# Patient Record
Sex: Male | Born: 1960 | Race: Black or African American | Hispanic: No | Marital: Single | Smoking: Current every day smoker
Health system: Southern US, Community
[De-identification: ages and names within clinical notes are randomized; demographics above are authoritative.]

## PROBLEM LIST (undated history)

## (undated) DIAGNOSIS — I10 Essential (primary) hypertension: Secondary | ICD-10-CM

---

## 2015-05-04 ENCOUNTER — Other Ambulatory Visit: Payer: Self-pay

## 2015-05-04 ENCOUNTER — Encounter (HOSPITAL_COMMUNITY): Payer: Self-pay

## 2015-05-04 ENCOUNTER — Emergency Department (HOSPITAL_COMMUNITY): Payer: Self-pay

## 2015-05-04 ENCOUNTER — Emergency Department (HOSPITAL_COMMUNITY)
Admission: EM | Admit: 2015-05-04 | Discharge: 2015-05-04 | Disposition: A | Discharge status: Home / Self Care | Payer: Self-pay | Attending: Emergency Medicine | Admitting: Emergency Medicine

## 2015-05-04 DIAGNOSIS — R630 Anorexia: Secondary | ICD-10-CM | POA: Insufficient documentation

## 2015-05-04 DIAGNOSIS — E871 Hypo-osmolality and hyponatremia: Secondary | ICD-10-CM | POA: Insufficient documentation

## 2015-05-04 DIAGNOSIS — F17200 Nicotine dependence, unspecified, uncomplicated: Secondary | ICD-10-CM | POA: Insufficient documentation

## 2015-05-04 DIAGNOSIS — R55 Syncope and collapse: Secondary | ICD-10-CM | POA: Insufficient documentation

## 2015-05-04 LAB — BASIC METABOLIC PANEL
Anion gap: 13 (ref 5–15)
BUN: 8 mg/dL (ref 6–20)
CO2: 22 mmol/L (ref 22–32)
Calcium: 9.3 mg/dL (ref 8.9–10.3)
Chloride: 90 mmol/L — ABNORMAL LOW (ref 101–111)
Creatinine, Ser: 1.06 mg/dL (ref 0.61–1.24)
GFR calc Af Amer: >60 mL/min (ref >60)
GLUCOSE: 133 mg/dL — AB (ref 65–99)
POTASSIUM: 3.4 mmol/L — AB (ref 3.5–5.1)
SODIUM: 125 mmol/L — AB (ref 135–145)

## 2015-05-04 LAB — DIFFERENTIAL
Basophils Absolute: 0 10*3/uL (ref 0.0–0.1)
Basophils Relative: 0 %
EOS ABS: 0 10*3/uL (ref 0.0–0.7)
EOS PCT: 1 %
LYMPHS PCT: 44 %
Lymphs Abs: 1.8 10*3/uL (ref 0.7–4.0)
MONO ABS: 0.7 10*3/uL (ref 0.1–1.0)
Monocytes Relative: 17 %
Neutro Abs: 1.6 10*3/uL — ABNORMAL LOW (ref 1.7–7.7)
Neutrophils Relative %: 39 %

## 2015-05-04 LAB — CBC
HCT: 39.1 % (ref 39.0–52.0)
Hemoglobin: 13.9 g/dL (ref 13.0–17.0)
MCH: 32.8 pg (ref 26.0–34.0)
MCHC: 35.5 g/dL (ref 30.0–36.0)
MCV: 92.2 fL (ref 78.0–100.0)
PLATELETS: 63 10*3/uL — AB (ref 150–400)
RBC: 4.24 MIL/uL (ref 4.22–5.81)
RDW: 13.7 % (ref 11.5–15.5)
WBC: 4.2 10*3/uL (ref 4.0–10.5)

## 2015-05-04 LAB — TROPONIN I: Troponin I: <0.03 ng/mL (ref <0.031)

## 2015-05-04 MED ORDER — ASPIRIN 81 MG PO CHEW: 324.0000 mg | CHEWABLE_TABLET | Freq: Once | ORAL | Status: DC

## 2015-05-04 MED ORDER — HEPARIN SODIUM (PORCINE) 5000 UNIT/ML IJ SOLN: 60.0000 [IU]/kg | Freq: Once | INTRAMUSCULAR | Status: DC

## 2015-05-04 MED ORDER — SODIUM CHLORIDE 0.9 % IV BOLUS (SEPSIS)
1000.0000 mL | Freq: Once | INTRAVENOUS | Status: AC
  Administered 2015-05-04: 1000 mL via INTRAVENOUS

## 2015-05-04 NOTE — ED Notes (Signed)
stemi cancelled.  

## 2015-05-04 NOTE — ED Notes (Signed)
See stemi narrator

## 2015-05-04 NOTE — ED Provider Notes (Signed)
CSN: [id number]     Arrival date & time 05/04/15  0039 History   By signing my name below, I, Charles Walters, attest that this documentation has been prepared under the direction and in the presence of Charles BilisKevin Shaquan Puerta, MD.  Electronically Signed: Arlan OrganAshley Walters, ED Scribe. 05/04/2015. 12:51 AM.   Chief Complaint  Patient presents with  . Code STEMI   HPI  HPI Comments: Charles Walters brought in by EMS is a 54 y.o. male without any pertinent past medical history who presents to the Emergency Department here for possible code NSTEMI this evening. Pt states he was in the bathroom attempting to bend down to the use bathroom. States he then fell to the ground and had a syncopal episode lasting 3-5 minutes with incontinence just prior to arrival. Pt states he felt fine prior to and after the episode. Charles Walters admits he hasnt eaten much in the last 24 hours as he has not had an appetite. ETOH use reported in last few days. Pt was given a dose of ASA en route to department but no Nitro given. He denies any fever, chills, nausea, vomiting, diarrhea, chest pain, shortness of breath, or neck pain at this time. No prior history of stroke or MI. He denies any personal or family history of diabetes, HTN, or hyperlipidemia.  He does report daily are all intact.  PCP: No PCP Per Patient    History reviewed. No pertinent past medical history. History reviewed. No pertinent past surgical history. History reviewed. No pertinent family history. Social History  Substance Use Topics  . Smoking status: Current Every Day Smoker  . Smokeless tobacco: None  . Alcohol Use: Beer Daily    Review of Systems  A complete 10 system review of systems was obtained and all systems are negative except as noted in the HPI and PMH.    Allergies  Review of patient's allergies indicates no known allergies.  Home Medications   Prior to Admission medications   Not on File   Triage Vitals: BP 149/105 mmHg  Pulse 86  Temp(Src)  98.6 F (37 C) (Oral)  Resp 16  Ht 6\' 1"  (1.854 m)  Wt 185 lb (83.915 kg)  BMI 24.41 kg/m2  SpO2 99%   Physical Exam  Constitutional: He is oriented to person, place, and time. He appears well-developed and well-nourished.  HENT:  Head: Normocephalic and atraumatic.  Eyes: EOM are normal.  Neck: Normal range of motion.  Cardiovascular: Normal rate, regular rhythm, normal heart sounds and intact distal pulses.   Pulmonary/Chest: Effort normal and breath sounds normal. No respiratory distress.  Abdominal: Soft. He exhibits no distension. There is no tenderness.  Musculoskeletal: Normal range of motion.  Neurological: He is alert and oriented to person, place, and time.  Skin: Skin is warm and dry.  Psychiatric: He has a normal mood and affect. Judgment normal.  Nursing note and vitals reviewed.   ED Course  Procedures (including critical care time)  DIAGNOSTIC STUDIES: Oxygen Saturation is 99% on RA, Normal by my interpretation.    COORDINATION OF CARE: 12:42 AM-  Will order CXR, troponin I, and CBC. Discussed treatment plan with pt at bedside and pt agreed to plan.     Labs Review Labs Reviewed  CBC - Abnormal; Notable for the following:    Platelets 63 (*)    All other components within normal limits  DIFFERENTIAL - Abnormal; Notable for the following:    Neutro Abs 1.6 (*)    All  other components within normal limits  BASIC METABOLIC PANEL - Abnormal; Notable for the following:    Sodium 125 (*)    Potassium 3.4 (*)    Chloride 90 (*)    Glucose, Bld 133 (*)    All other components within normal limits  TROPONIN I    Imaging Review Dg Chest Portable 1 View  05/04/2015  CLINICAL DATA:  Syncopal episode for 3-5 minutes. Incontinence. Alcohol on board. EXAM: PORTABLE CHEST 1 VIEW COMPARISON:  None. FINDINGS: The heart size and mediastinal contours are within normal limits. Both lungs are clear. The visualized skeletal structures are unremarkable. IMPRESSION: No  active disease. Electronically Signed   By: Burman Nieves M.D.   On: 05/04/2015 01:07   I have personally reviewed and evaluated these images and lab results as part of my medical decision-making.   EKG Interpretation   Date/Time:  Sunday May 04 2015 00:45:24 EST Ventricular Rate:  87 PR Interval:  140 QRS Duration: 80 QT Interval:  384 QTC Calculation: 462 R Axis:   26 Text Interpretation:  Normal sinus rhythm Normal ECG No old tracing to  compare Confirmed by Sharnelle Cappelli  MD, Charles Walters ([id number]) on 05/04/2015 12:55:12 AM      MDM   Final diagnoses:  Syncope, unspecified syncope type  Hyponatremia    Hyponatremia and thrombocytopenia.  The patient does admit to daily beer intake.  I suspect this is chronic volume depletion.  His episode tonight was more than likely orthostasis.  He feels much better after IV fluids.  Patient was brought to the emergency department as a code STEMI but had absolutely no anginal complaints.  His EKG is without ischemic changes.  Doubt ACS.  The patient does not have a primary care physician.  Recommend they follow up with 1.  Discharge home in good condition.  He understands to return to the ER for new or worsening symptoms  I personally performed the services described in this documentation, which was scribed in my presence. The recorded information has been reviewed and is accurate.       Charles Bilis, MD 05/04/15 [phone]

## 2015-05-04 NOTE — ED Notes (Signed)
Patient here by ems, had syncopal episode for 3-5 minutes and had incontinence, pt has etoh on board, reports no dizziness, lightheadedness or pain. Pt irritated with ems. Given asa pressure with ems 90 systolic

## 2015-05-04 NOTE — Discharge Instructions (Signed)
Hyponatremia °Hyponatremia is when the amount of salt (sodium) in your blood is too low. When sodium levels are low, your cells absorb extra water and they swell. The swelling happens throughout the body, but it mostly affects the brain. °CAUSES °This condition may be caused by: °· Heart, kidney, or liver problems. °· Thyroid problems. °· Adrenal gland problems. °· Metabolic conditions, such as syndrome of inappropriate antidiuretic hormone (SIADH). °· Severe vomiting and diarrhea. °· Certain medicines or illegal drugs. °· Dehydration. °· Drinking too much water. °· Eating a diet that is low in sodium. °· Large burns on your body. °· Sweating. °RISK FACTORS °This condition is more likely to develop in people who: °· Have long-term (chronic) kidney disease. °· Have heart failure. °· Have a medical condition that causes frequent or excessive diarrhea. °· Have metabolic conditions, such as Addison disease or SIADH. °· Take certain medicines that affect the sodium and fluid balance in the blood. Some of these medicine types include: °¨ Diuretics. °¨ NSAIDs. °¨ Some opioid pain medicines. °¨ Some antidepressants. °¨ Some seizure prevention medicines. °SYMPTOMS  °Symptoms of this condition include: °· Nausea and vomiting. °· Confusion. °· Lethargy. °· Agitation. °· Headache. °· Seizures. °· Unconsciousness. °· Appetite loss. °· Muscle weakness and cramping. °· Feeling weak or light-headed. °· Having a rapid heart rate. °· Fainting, in severe cases. °DIAGNOSIS °This condition is diagnosed with a medical history and physical exam. You will also have other tests, including: °· Blood tests. °· Urine tests. °TREATMENT °Treatment for this condition depends on the cause. Treatment may include: °· Fluids given through an IV tube that is inserted into one of your veins. °· Medicines to correct the sodium imbalance. If medicines are causing the condition, the medicines will need to be adjusted. °· Limiting water or fluid intake to  get the correct sodium balance. °HOME CARE INSTRUCTIONS °· Take medicines only as directed by your health care provider. Many medicines can make this condition worse. Talk with your health care provider about any medicines that you are currently taking. °· Carefully follow a recommended diet as directed by your health care provider. °· Carefully follow instructions from your health care provider about fluid restrictions. °· Keep all follow-up visits as directed by your health care provider. This is important. °· Do not drink alcohol. °SEEK MEDICAL CARE IF: °· You develop worsening nausea, fatigue, headache, confusion, or weakness. °· Your symptoms go away and then return. °· You have problems following the recommended diet. °SEEK IMMEDIATE MEDICAL CARE IF: °· You have a seizure. °· You faint. °· You have ongoing diarrhea or vomiting. °  °This information is not intended to replace advice given to you by your health care provider. Make sure you discuss any questions you have with your health care provider. °  °Document Released: 05/14/2002 Document Revised: 10/08/2014 Document Reviewed: 06/13/2014 °Elsevier Interactive Patient Education ©2016 [employer] ° ° ° °

## 2015-05-23 ENCOUNTER — Ambulatory Visit: Payer: Self-pay

## 2016-11-24 ENCOUNTER — Ambulatory Visit (HOSPITAL_COMMUNITY)
Admission: EM | Admit: 2016-11-24 | Discharge: 2016-11-24 | Disposition: A | Discharge status: Home / Self Care | Payer: Self-pay | Attending: Family Medicine | Admitting: Family Medicine

## 2016-11-24 ENCOUNTER — Encounter (HOSPITAL_COMMUNITY): Payer: Self-pay | Admitting: Emergency Medicine

## 2016-11-24 DIAGNOSIS — T783XXA Angioneurotic edema, initial encounter: Secondary | ICD-10-CM

## 2016-11-24 DIAGNOSIS — I10 Essential (primary) hypertension: Secondary | ICD-10-CM

## 2016-11-24 DIAGNOSIS — Z888 Allergy status to other drugs, medicaments and biological substances status: Secondary | ICD-10-CM

## 2016-11-24 HISTORY — DX: Essential (primary) hypertension: I10

## 2016-11-24 MED ORDER — AMLODIPINE BESYLATE 5 MG PO TABS: 5.0000 mg | ORAL_TABLET | Freq: Every day | ORAL | 0 refills | Status: DC

## 2016-11-24 MED ORDER — METHYLPREDNISOLONE 4 MG PO TBPK: ORAL_TABLET | ORAL | 0 refills | Status: DC

## 2016-11-24 MED ORDER — METHYLPREDNISOLONE SODIUM SUCC 125 MG IJ SOLR
INTRAMUSCULAR | Status: AC
  Filled 2016-11-24: qty 2

## 2016-11-24 MED ORDER — METHYLPREDNISOLONE SODIUM SUCC 125 MG IJ SOLR
125.0000 mg | Freq: Once | INTRAMUSCULAR | Status: AC
  Administered 2016-11-24: 125 mg via INTRAMUSCULAR

## 2016-11-24 NOTE — Discharge Instructions (Signed)
Follow up with PCP.  Stop taking bp medicine from home that starts with L it is probably ACE inhibitor and you are allergic to this.  Start new bp medicine and see PCP in next week.

## 2016-11-24 NOTE — ED Provider Notes (Signed)
CSN: [id number]     Arrival date & time 11/24/16  1108 History   First MD Initiated Contact with Patient 11/24/16 1154     Chief Complaint  Patient presents with  . Facial Swelling   (Consider location/radiation/quality/duration/timing/severity/associated sxs/prior Treatment) Patient c/o facial and lower lip swelling today.  He does take a bp med from home that starts with an L and ends with an L and he states he takes 20mg  dosage.  He has been on this medicine for 3 years.   The history is provided by the patient.  Facial Injury  Location:  Face (lower lip) Pain details:    Severity:  Moderate   Duration:  1 day   Timing:  Constant   Progression:  Unable to specify Foreign body present:  Unable to specify Relieved by:  Nothing Worsened by:  Nothing Ineffective treatments:  None tried   Past Medical History:  Diagnosis Date  . Hypertension    No past surgical history on file. No family history on file. Social History  Substance Use Topics  . Smoking status: Current Every Day Smoker  . Smokeless tobacco: Not on file  . Alcohol use Yes    Review of Systems  Constitutional: Negative.   HENT: Negative.   Eyes: Negative.   Respiratory: Negative.   Cardiovascular: Negative.   Gastrointestinal: Negative.   Endocrine: Negative.   Genitourinary: Negative.   Musculoskeletal: Positive for arthralgias.  Allergic/Immunologic: Negative.   Neurological: Negative.   Hematological: Negative.   Psychiatric/Behavioral: Negative.     Allergies  Patient has no known allergies.  Home Medications   Prior to Admission medications   Medication Sig Start Date End Date Taking? Authorizing Provider  OVER THE COUNTER MEDICATION Patient takes one type of blood pressure medicine and started this med 4 months ago.  ?l..p"   Yes [provider]  amLODipine (NORVASC) 5 MG tablet Take 1 tablet (5 mg total) by mouth daily. 11/24/16   Deatra Canterxford, Max Romano J, FNP  methylPREDNISolone  (MEDROL DOSEPAK) 4 MG TBPK tablet 6-5-4-3-2-1 po qd 11/24/16   Deatra Canterxford, Nolan Lasser J, FNP   Meds Ordered and Administered this Visit   Medications  methylPREDNISolone sodium succinate (SOLU-MEDROL) 125 mg/2 mL injection 125 mg (125 mg Intramuscular Given 11/24/16 1206)    BP 124/87 (BP Location: Left Arm)   Pulse 96   Temp 98.4 F (36.9 C) (Oral)   Resp 20   SpO2 100%  No data found.   Physical Exam  Constitutional: He appears well-developed and well-nourished.  HENT:  Head: Normocephalic and atraumatic.  Left lower lip with swelling.  Eyes: Conjunctivae and EOM are normal. Pupils are equal, round, and reactive to light.  Neck: Normal range of motion. Neck supple.  Cardiovascular: Normal rate, regular rhythm and normal heart sounds.   Pulmonary/Chest: Effort normal and breath sounds normal.  Abdominal: Soft. Bowel sounds are normal.  Nursing note and vitals reviewed.   Urgent Care Course     Procedures (including critical care time)  Labs Review Labs Reviewed - No data to display  Imaging Review No results found.   Visual Acuity Review  Right Eye Distance:   Left Eye Distance:   Bilateral Distance:    Right Eye Near:   Left Eye Near:    Bilateral Near:         MDM   1. Angioedema, initial encounter   2. Essential hypertension   3. Allergy to ACE inhibitors    Stop medicine for bp from  home and start amlodipine 5mg  one po qd #14  Follow up with pcp.      Deatra Canter, FNP 11/24/16 1245

## 2016-11-24 NOTE — ED Triage Notes (Signed)
Woke today with left facial swelling and left side of lip swollen.  Denies breathing trouble Patient did have dental work 3 weeks ago-front tooth was pulled at that time

## 2017-06-27 ENCOUNTER — Ambulatory Visit (INDEPENDENT_AMBULATORY_CARE_PROVIDER_SITE_OTHER): Payer: Self-pay

## 2017-06-27 ENCOUNTER — Encounter (HOSPITAL_COMMUNITY): Payer: Self-pay | Admitting: Emergency Medicine

## 2017-06-27 ENCOUNTER — Ambulatory Visit (HOSPITAL_COMMUNITY)
Admission: EM | Admit: 2017-06-27 | Discharge: 2017-06-27 | Disposition: A | Discharge status: Home / Self Care | Payer: Self-pay | Attending: Family Medicine | Admitting: Family Medicine

## 2017-06-27 ENCOUNTER — Other Ambulatory Visit: Payer: Self-pay

## 2017-06-27 DIAGNOSIS — M25552 Pain in left hip: Secondary | ICD-10-CM

## 2017-06-27 MED ORDER — DICLOFENAC SODIUM 75 MG PO TBEC: 75.0000 mg | DELAYED_RELEASE_TABLET | Freq: Two times a day (BID) | ORAL | 0 refills | Status: AC

## 2017-06-27 NOTE — ED Triage Notes (Signed)
Pt c/o L upper leg pain x2 weeks. Pt has been taking tylenol without relief. Denies injury.

## 2017-06-27 NOTE — Discharge Instructions (Signed)
Xray looked good.   Please continue to treat this with antiinflammatories. I have sent in diclofenac to take twice daily for pain. May aslo supplement with tylenol.  Continue to rest and avoid aggravating factors.

## 2017-06-28 NOTE — ED Provider Notes (Signed)
[facility]    CSN: [id number] Arrival date & time: 06/27/17  1423     History   Chief Complaint Chief Complaint  Patient presents with  . Leg Pain    HPI Charles Walters is a 57 y.o. male presenting with left hip and knee pain x 1 week. Also endorsing some numbness. Denies injury, does do a lot of lifting at work. Worsens with walking. Pain improves with rest, has to rest frequently at work. Taking Tylenol extra strength for pain, with minimal relief. Denies weakness. Denies steroid use/prednisone use.   HPI  Past Medical History:  Diagnosis Date  . Hypertension     There are no active problems to display for this patient.   History reviewed. No pertinent surgical history.     Home Medications    Prior to Admission medications   Medication Sig Start Date End Date Taking? Authorizing Provider  amLODipine (NORVASC) 5 MG tablet Take 1 tablet (5 mg total) by mouth daily. 11/24/16   Charles Walters, William J, FNP  diclofenac (VOLTAREN) 75 MG EC tablet Take 1 tablet (75 mg total) by mouth 2 (two) times daily for 10 days. 06/27/17 07/07/17  Wieters, Hallie C, PA-C  methylPREDNISolone (MEDROL DOSEPAK) 4 MG TBPK tablet 6-5-4-3-2-1 po qd 11/24/16   Charles Walters, William J, FNP  OVER THE COUNTER MEDICATION Patient takes one type of blood pressure medicine and started this med 4 months ago.  ?l..p"    [provider]    Family History No family history on file.  Social History Social History   Tobacco Use  . Smoking status: Current Every Day Smoker  Substance Use Topics  . Alcohol use: Yes  . Drug use: No     Allergies   Patient has no known allergies.   Review of Systems Review of Systems  Constitutional: Negative for fatigue and fever.  Gastrointestinal: Negative for nausea and vomiting.  Musculoskeletal: Positive for arthralgias and myalgias. Negative for gait problem.  Neurological: Positive for numbness. Negative for dizziness, weakness, light-headedness and  headaches.     Physical Exam Triage Vital Signs ED Triage Vitals  Enc Vitals Group     BP 06/27/17 1538 (!) 176/103     Pulse Rate 06/27/17 1538 96     Resp 06/27/17 1538 18     Temp 06/27/17 1538 98.7 F (37.1 C)     Temp Source 06/27/17 1538 Oral     SpO2 06/27/17 1538 100 %     Weight --      Height --      Head Circumference --      Peak Flow --      Pain Score 06/27/17 1540 10     Pain Loc --      Pain Edu? --      Excl. in GC? --    No data found.  Updated Vital Signs BP (!) 176/103   Pulse 96   Temp 98.7 F (37.1 C) (Oral)   Resp 18   SpO2 100%   Visual Acuity Right Eye Distance:   Left Eye Distance:   Bilateral Distance:    Right Eye Near:   Left Eye Near:    Bilateral Near:     Physical Exam  Constitutional: He appears well-developed and well-nourished. No distress.  HENT:  Head: Normocephalic and atraumatic.  Cardiovascular: Normal rate.  Pulmonary/Chest: Effort normal. No respiratory distress.  Musculoskeletal: He exhibits tenderness. He exhibits no deformity.  Left hip: mild tenderness to palpation  of lateral hip down into midline thigh, diffuse tenderness, states numbness in some areas. Strength 5/5 compared to right.   Full ROM of left knee     UC Treatments / Results  Labs (all labs ordered are listed, but only abnormal results are displayed) Labs Reviewed - No data to display  EKG  EKG Interpretation None       Radiology Dg Hip Unilat With Pelvis 2-3 Views Right  Result Date: 06/27/2017 CLINICAL DATA:  Left hip pain for 2 weeks.  No known injury. EXAM: DG HIP (WITH OR WITHOUT PELVIS) 2-3V RIGHT COMPARISON:  None. FINDINGS: There is no evidence of hip fracture or dislocation. There is no evidence of arthropathy or other focal bone abnormality. IMPRESSION: Negative exam. Electronically Signed   By: Drusilla Kanner M.D.   On: 06/27/2017 17:20    Procedures Procedures (including critical care time)  Medications Ordered in  UC Medications - No data to display   Initial Impression / Assessment and Plan / UC Course  I have reviewed the triage vital signs and the nursing notes.  Pertinent labs & imaging results that were available during my care of the patient were reviewed by me and considered in my medical decision making (see chart for details).     XR without abnormality, no signs of AVN. Will treat with NSAIDs, rest, ice/heat. Follow up if not improving.   Final Clinical Impressions(s) / UC Diagnoses   Final diagnoses:  Acute hip pain, left    ED Discharge Orders        Ordered    diclofenac (VOLTAREN) 75 MG EC tablet  2 times daily     06/27/17 1715       Controlled Substance Prescriptions [place] Controlled Substance Registry consulted? Not Applicable   Lew Dawes, [place] 06/28/17 [phone]

## 2019-07-25 ENCOUNTER — Inpatient Hospital Stay (HOSPITAL_COMMUNITY)
Admission: EM | Admit: 2019-07-25 | Discharge: 2019-09-06 | DRG: 207 | Disposition: E | Payer: Medicaid Other | Attending: Pulmonary Disease | Admitting: Pulmonary Disease

## 2019-07-25 ENCOUNTER — Inpatient Hospital Stay (HOSPITAL_COMMUNITY): Payer: Medicaid Other

## 2019-07-25 ENCOUNTER — Emergency Department (HOSPITAL_COMMUNITY): Payer: Medicaid Other

## 2019-07-25 ENCOUNTER — Other Ambulatory Visit: Payer: Self-pay

## 2019-07-25 ENCOUNTER — Encounter (HOSPITAL_COMMUNITY): Payer: Self-pay

## 2019-07-25 DIAGNOSIS — Z515 Encounter for palliative care: Secondary | ICD-10-CM | POA: Diagnosis not present

## 2019-07-25 DIAGNOSIS — I712 Thoracic aortic aneurysm, without rupture: Secondary | ICD-10-CM | POA: Diagnosis present

## 2019-07-25 DIAGNOSIS — I82451 Acute embolism and thrombosis of right peroneal vein: Secondary | ICD-10-CM | POA: Diagnosis not present

## 2019-07-25 DIAGNOSIS — Z66 Do not resuscitate: Secondary | ICD-10-CM | POA: Diagnosis not present

## 2019-07-25 DIAGNOSIS — R6521 Severe sepsis with septic shock: Secondary | ICD-10-CM | POA: Diagnosis not present

## 2019-07-25 DIAGNOSIS — E871 Hypo-osmolality and hyponatremia: Secondary | ICD-10-CM | POA: Diagnosis present

## 2019-07-25 DIAGNOSIS — G9341 Metabolic encephalopathy: Secondary | ICD-10-CM | POA: Diagnosis not present

## 2019-07-25 DIAGNOSIS — N179 Acute kidney failure, unspecified: Secondary | ICD-10-CM | POA: Diagnosis not present

## 2019-07-25 DIAGNOSIS — G931 Anoxic brain damage, not elsewhere classified: Secondary | ICD-10-CM | POA: Diagnosis not present

## 2019-07-25 DIAGNOSIS — J9602 Acute respiratory failure with hypercapnia: Secondary | ICD-10-CM | POA: Diagnosis not present

## 2019-07-25 DIAGNOSIS — Z23 Encounter for immunization: Secondary | ICD-10-CM | POA: Diagnosis not present

## 2019-07-25 DIAGNOSIS — I700 Atherosclerosis of aorta: Secondary | ICD-10-CM | POA: Diagnosis present

## 2019-07-25 DIAGNOSIS — I959 Hypotension, unspecified: Secondary | ICD-10-CM | POA: Diagnosis not present

## 2019-07-25 DIAGNOSIS — Z9911 Dependence on respirator [ventilator] status: Secondary | ICD-10-CM

## 2019-07-25 DIAGNOSIS — K0889 Other specified disorders of teeth and supporting structures: Secondary | ICD-10-CM | POA: Diagnosis present

## 2019-07-25 DIAGNOSIS — Z978 Presence of other specified devices: Secondary | ICD-10-CM

## 2019-07-25 DIAGNOSIS — A419 Sepsis, unspecified organism: Secondary | ICD-10-CM | POA: Diagnosis not present

## 2019-07-25 DIAGNOSIS — J84114 Acute interstitial pneumonitis: Secondary | ICD-10-CM | POA: Diagnosis not present

## 2019-07-25 DIAGNOSIS — E43 Unspecified severe protein-calorie malnutrition: Secondary | ICD-10-CM | POA: Diagnosis present

## 2019-07-25 DIAGNOSIS — Z79899 Other long term (current) drug therapy: Secondary | ICD-10-CM

## 2019-07-25 DIAGNOSIS — R066 Hiccough: Secondary | ICD-10-CM | POA: Diagnosis present

## 2019-07-25 DIAGNOSIS — G936 Cerebral edema: Secondary | ICD-10-CM | POA: Diagnosis not present

## 2019-07-25 DIAGNOSIS — R64 Cachexia: Secondary | ICD-10-CM | POA: Diagnosis present

## 2019-07-25 DIAGNOSIS — I82441 Acute embolism and thrombosis of right tibial vein: Secondary | ICD-10-CM | POA: Diagnosis not present

## 2019-07-25 DIAGNOSIS — J9382 Other air leak: Secondary | ICD-10-CM | POA: Diagnosis not present

## 2019-07-25 DIAGNOSIS — I82431 Acute embolism and thrombosis of right popliteal vein: Secondary | ICD-10-CM | POA: Diagnosis not present

## 2019-07-25 DIAGNOSIS — I63411 Cerebral infarction due to embolism of right middle cerebral artery: Secondary | ICD-10-CM | POA: Diagnosis not present

## 2019-07-25 DIAGNOSIS — J849 Interstitial pulmonary disease, unspecified: Secondary | ICD-10-CM | POA: Clinically undetermined

## 2019-07-25 DIAGNOSIS — J90 Pleural effusion, not elsewhere classified: Secondary | ICD-10-CM | POA: Diagnosis not present

## 2019-07-25 DIAGNOSIS — I82401 Acute embolism and thrombosis of unspecified deep veins of right lower extremity: Secondary | ICD-10-CM | POA: Clinically undetermined

## 2019-07-25 DIAGNOSIS — J158 Pneumonia due to other specified bacteria: Secondary | ICD-10-CM | POA: Diagnosis present

## 2019-07-25 DIAGNOSIS — Z720 Tobacco use: Secondary | ICD-10-CM | POA: Diagnosis present

## 2019-07-25 DIAGNOSIS — F05 Delirium due to known physiological condition: Secondary | ICD-10-CM | POA: Diagnosis not present

## 2019-07-25 DIAGNOSIS — I10 Essential (primary) hypertension: Secondary | ICD-10-CM | POA: Diagnosis present

## 2019-07-25 DIAGNOSIS — R0902 Hypoxemia: Secondary | ICD-10-CM

## 2019-07-25 DIAGNOSIS — E861 Hypovolemia: Secondary | ICD-10-CM | POA: Diagnosis present

## 2019-07-25 DIAGNOSIS — Z9689 Presence of other specified functional implants: Secondary | ICD-10-CM

## 2019-07-25 DIAGNOSIS — J9601 Acute respiratory failure with hypoxia: Secondary | ICD-10-CM | POA: Diagnosis not present

## 2019-07-25 DIAGNOSIS — D649 Anemia, unspecified: Secondary | ICD-10-CM | POA: Clinically undetermined

## 2019-07-25 DIAGNOSIS — F17210 Nicotine dependence, cigarettes, uncomplicated: Secondary | ICD-10-CM | POA: Diagnosis present

## 2019-07-25 DIAGNOSIS — I252 Old myocardial infarction: Secondary | ICD-10-CM

## 2019-07-25 DIAGNOSIS — R000 Tachycardia, unspecified: Secondary | ICD-10-CM | POA: Diagnosis present

## 2019-07-25 DIAGNOSIS — Z789 Other specified health status: Secondary | ICD-10-CM

## 2019-07-25 DIAGNOSIS — Y92230 Patient room in hospital as the place of occurrence of the external cause: Secondary | ICD-10-CM | POA: Diagnosis not present

## 2019-07-25 DIAGNOSIS — R296 Repeated falls: Secondary | ICD-10-CM | POA: Diagnosis present

## 2019-07-25 DIAGNOSIS — E785 Hyperlipidemia, unspecified: Secondary | ICD-10-CM | POA: Diagnosis present

## 2019-07-25 DIAGNOSIS — Z20822 Contact with and (suspected) exposure to covid-19: Secondary | ICD-10-CM | POA: Diagnosis present

## 2019-07-25 DIAGNOSIS — J159 Unspecified bacterial pneumonia: Secondary | ICD-10-CM | POA: Diagnosis present

## 2019-07-25 DIAGNOSIS — E870 Hyperosmolality and hypernatremia: Secondary | ICD-10-CM | POA: Diagnosis not present

## 2019-07-25 DIAGNOSIS — J930 Spontaneous tension pneumothorax: Secondary | ICD-10-CM | POA: Diagnosis not present

## 2019-07-25 DIAGNOSIS — I6340 Cerebral infarction due to embolism of unspecified cerebral artery: Secondary | ICD-10-CM | POA: Insufficient documentation

## 2019-07-25 DIAGNOSIS — I081 Rheumatic disorders of both mitral and tricuspid valves: Secondary | ICD-10-CM | POA: Diagnosis present

## 2019-07-25 DIAGNOSIS — R0600 Dyspnea, unspecified: Secondary | ICD-10-CM

## 2019-07-25 DIAGNOSIS — R197 Diarrhea, unspecified: Secondary | ICD-10-CM | POA: Diagnosis present

## 2019-07-25 DIAGNOSIS — Z86711 Personal history of pulmonary embolism: Secondary | ICD-10-CM

## 2019-07-25 DIAGNOSIS — Z9181 History of falling: Secondary | ICD-10-CM

## 2019-07-25 DIAGNOSIS — E876 Hypokalemia: Secondary | ICD-10-CM | POA: Diagnosis present

## 2019-07-25 DIAGNOSIS — Y848 Other medical procedures as the cause of abnormal reaction of the patient, or of later complication, without mention of misadventure at the time of the procedure: Secondary | ICD-10-CM | POA: Diagnosis not present

## 2019-07-25 DIAGNOSIS — D72829 Elevated white blood cell count, unspecified: Secondary | ICD-10-CM | POA: Diagnosis present

## 2019-07-25 DIAGNOSIS — I469 Cardiac arrest, cause unspecified: Secondary | ICD-10-CM | POA: Diagnosis not present

## 2019-07-25 DIAGNOSIS — E875 Hyperkalemia: Secondary | ICD-10-CM | POA: Diagnosis not present

## 2019-07-25 DIAGNOSIS — F1010 Alcohol abuse, uncomplicated: Secondary | ICD-10-CM | POA: Diagnosis present

## 2019-07-25 DIAGNOSIS — Z681 Body mass index (BMI) 19 or less, adult: Secondary | ICD-10-CM

## 2019-07-25 DIAGNOSIS — R531 Weakness: Secondary | ICD-10-CM

## 2019-07-25 DIAGNOSIS — J9690 Respiratory failure, unspecified, unspecified whether with hypoxia or hypercapnia: Secondary | ICD-10-CM

## 2019-07-25 DIAGNOSIS — I82461 Acute embolism and thrombosis of right calf muscular vein: Secondary | ICD-10-CM | POA: Diagnosis not present

## 2019-07-25 DIAGNOSIS — J939 Pneumothorax, unspecified: Secondary | ICD-10-CM

## 2019-07-25 DIAGNOSIS — J129 Viral pneumonia, unspecified: Secondary | ICD-10-CM | POA: Diagnosis present

## 2019-07-25 DIAGNOSIS — S2231XA Fracture of one rib, right side, initial encounter for closed fracture: Secondary | ICD-10-CM | POA: Diagnosis not present

## 2019-07-25 DIAGNOSIS — R1312 Dysphagia, oropharyngeal phase: Secondary | ICD-10-CM | POA: Diagnosis not present

## 2019-07-25 DIAGNOSIS — K760 Fatty (change of) liver, not elsewhere classified: Secondary | ICD-10-CM | POA: Diagnosis present

## 2019-07-25 DIAGNOSIS — J189 Pneumonia, unspecified organism: Secondary | ICD-10-CM

## 2019-07-25 LAB — SODIUM, URINE, RANDOM: Sodium, Ur: <10 mmol/L

## 2019-07-25 LAB — BASIC METABOLIC PANEL
Anion gap: 17 — ABNORMAL HIGH (ref 5–15)
Anion gap: 15 (ref 5–15)
BUN: 7 mg/dL (ref 6–20)
BUN: 7 mg/dL (ref 6–20)
CO2: 17 mmol/L — ABNORMAL LOW (ref 22–32)
CO2: 20 mmol/L — ABNORMAL LOW (ref 22–32)
Calcium: 8.5 mg/dL — ABNORMAL LOW (ref 8.9–10.3)
Calcium: 8.6 mg/dL — ABNORMAL LOW (ref 8.9–10.3)
Chloride: 80 mmol/L — ABNORMAL LOW (ref 98–111)
Chloride: 79 mmol/L — ABNORMAL LOW (ref 98–111)
Creatinine, Ser: 0.77 mg/dL (ref 0.61–1.24)
Creatinine, Ser: 0.64 mg/dL (ref 0.61–1.24)
GFR calc Af Amer: >60 mL/min (ref >60)
GFR calc Af Amer: >60 mL/min (ref >60)
GFR calc non Af Amer: >60 mL/min (ref >60)
GFR calc non Af Amer: >60 mL/min (ref >60)
Glucose, Bld: 126 mg/dL — ABNORMAL HIGH (ref 70–99)
Glucose, Bld: 107 mg/dL — ABNORMAL HIGH (ref 70–99)
Potassium: 3 mmol/L — ABNORMAL LOW (ref 3.5–5.1)
Potassium: 3.4 mmol/L — ABNORMAL LOW (ref 3.5–5.1)
Sodium: 114 mmol/L — CL (ref 135–145)
Sodium: 114 mmol/L — CL (ref 135–145)

## 2019-07-25 LAB — PROCALCITONIN: Procalcitonin: 0.53 ng/mL

## 2019-07-25 LAB — TROPONIN I (HIGH SENSITIVITY)
Troponin I (High Sensitivity): 6 ng/L (ref <18)
Troponin I (High Sensitivity): 7 ng/L (ref <18)

## 2019-07-25 LAB — C-REACTIVE PROTEIN: CRP: 14.6 mg/dL — ABNORMAL HIGH (ref <1.0)

## 2019-07-25 LAB — TSH: TSH: 1.767 u[IU]/mL (ref 0.350–4.500)

## 2019-07-25 LAB — SODIUM
Sodium: 116 mmol/L — CL (ref 135–145)
Sodium: 115 mmol/L — CL (ref 135–145)

## 2019-07-25 LAB — ETHANOL: Alcohol, Ethyl (B): <10 mg/dL (ref <10)

## 2019-07-25 LAB — CBC
HCT: 33.1 % — ABNORMAL LOW (ref 39.0–52.0)
Hemoglobin: 12.3 g/dL — ABNORMAL LOW (ref 13.0–17.0)
MCH: 32.1 pg (ref 26.0–34.0)
MCHC: 37.2 g/dL — ABNORMAL HIGH (ref 30.0–36.0)
MCV: 86.4 fL (ref 80.0–100.0)
Platelets: 235 10*3/uL (ref 150–400)
RBC: 3.83 MIL/uL — ABNORMAL LOW (ref 4.22–5.81)
RDW: 11.2 % — ABNORMAL LOW (ref 11.5–15.5)
WBC: 10.9 10*3/uL — ABNORMAL HIGH (ref 4.0–10.5)
nRBC: 0 % (ref 0.0–0.2)

## 2019-07-25 LAB — CREATININE, URINE, RANDOM: Creatinine, Urine: 164.66 mg/dL

## 2019-07-25 LAB — POC SARS CORONAVIRUS 2 AG -  ED: SARS Coronavirus 2 Ag: NEGATIVE

## 2019-07-25 LAB — OSMOLALITY, URINE: Osmolality, Ur: 507 mOsm/kg (ref 300–900)

## 2019-07-25 LAB — CORTISOL: Cortisol, Plasma: 35.2 ug/dL

## 2019-07-25 LAB — D-DIMER, QUANTITATIVE: D-Dimer, Quant: 3.7 ug/mL-FEU — ABNORMAL HIGH (ref 0.00–0.50)

## 2019-07-25 LAB — SARS CORONAVIRUS 2 (TAT 6-24 HRS): SARS Coronavirus 2: NEGATIVE

## 2019-07-25 LAB — FERRITIN: Ferritin: 1286 ng/mL — ABNORMAL HIGH (ref 24–336)

## 2019-07-25 LAB — OSMOLALITY: Osmolality: 241 mOsm/kg — CL (ref 275–295)

## 2019-07-25 LAB — LACTATE DEHYDROGENASE: LDH: 285 U/L — ABNORMAL HIGH (ref 98–192)

## 2019-07-25 MED ORDER — AMLODIPINE BESYLATE 10 MG PO TABS
10.0000 mg | ORAL_TABLET | Freq: Every day | ORAL | Status: DC
  Administered 2019-07-26 – 2019-07-30 (×5): 10 mg via ORAL
  Filled 2019-07-25 (×5): qty 1

## 2019-07-25 MED ORDER — SODIUM CHLORIDE 3 % IV SOLN
INTRAVENOUS | Status: DC
  Administered 2019-07-25: 30 mL/h via INTRAVENOUS
  Filled 2019-07-25: qty 500

## 2019-07-25 MED ORDER — POTASSIUM CHLORIDE CRYS ER 20 MEQ PO TBCR
40.0000 meq | EXTENDED_RELEASE_TABLET | Freq: Once | ORAL | Status: AC
  Administered 2019-07-25: 40 meq via ORAL
  Filled 2019-07-25: qty 2

## 2019-07-25 MED ORDER — POTASSIUM CHLORIDE CRYS ER 20 MEQ PO TBCR
40.0000 meq | EXTENDED_RELEASE_TABLET | Freq: Once | ORAL | Status: AC
  Administered 2019-07-25: 20:00:00 40 meq via ORAL
  Filled 2019-07-25: qty 2

## 2019-07-25 MED ORDER — SODIUM CHLORIDE 0.9% FLUSH: 3.0000 mL | Freq: Once | INTRAVENOUS | Status: DC

## 2019-07-25 MED ORDER — SODIUM CHLORIDE 0.9 % IV SOLN
500.0000 mg | INTRAVENOUS | Status: DC
  Administered 2019-07-25: 500 mg via INTRAVENOUS
  Filled 2019-07-25 (×2): qty 500

## 2019-07-25 MED ORDER — SODIUM CHLORIDE 3 % IV SOLN
INTRAVENOUS | Status: DC
  Administered 2019-07-25: 18:00:00 20 mL/h via INTRAVENOUS
  Filled 2019-07-25: qty 500

## 2019-07-25 MED ORDER — ACETAMINOPHEN 650 MG RE SUPP: 650.0000 mg | Freq: Four times a day (QID) | RECTAL | Status: DC | PRN

## 2019-07-25 MED ORDER — ENOXAPARIN SODIUM 40 MG/0.4ML ~~LOC~~ SOLN
40.0000 mg | Freq: Every day | SUBCUTANEOUS | Status: DC
  Administered 2019-07-25 – 2019-08-10 (×17): 40 mg via SUBCUTANEOUS
  Filled 2019-07-25 (×16): qty 0.4

## 2019-07-25 MED ORDER — ACETAMINOPHEN 325 MG PO TABS
650.0000 mg | ORAL_TABLET | Freq: Four times a day (QID) | ORAL | Status: DC | PRN
  Administered 2019-07-30 – 2019-08-14 (×5): 650 mg via ORAL
  Filled 2019-07-25 (×7): qty 2

## 2019-07-25 MED ORDER — SODIUM CHLORIDE 0.9 % IV SOLN
1.0000 g | INTRAVENOUS | Status: DC
  Administered 2019-07-25: 22:00:00 1 g via INTRAVENOUS
  Filled 2019-07-25 (×2): qty 10

## 2019-07-25 NOTE — Consult Note (Signed)
Reason for Consult: Hyponatremia Referring Physician: Jamse Arn, MD  Charles Walters is an 59 y.o. male.  HPI: Charles Walters has a PMH significant for HTN and tobacco abuse who presented to [facility] ED with a 2 week history of hiccups, dizziness, diarrhea, and worsening shortness of breath for the past few days.  He also noted some right-sided chest pain with inspiration but denies any nausea, vomiting, fevers, chills, hemoptysis, loss of smell/taste, or exposure to anyone with known covid infection.  He does report decreased po intake and a 7 lbs weight loss over the past month or so.    In the ED he was noted to have tachycardia, and labs were notable for a Na of 114, K 3, CL 80, CO2 17, WBC 10.9, and glucose of 126.  POC covid test was negative.  His CXR showed diffuse interstitial thickening in the lung bases.  He is being admitted for further management of his severe hyponatrermia and we were consulted to further evaluate and help manage his electrolyte abnormalities.    Of note, he had been taking an ARB and thiazide diuretic prior to admission.  These have been held.  He did have a low sodium level 5 years ago but no other laboratory data is available for review.     Trend in Creatinine: Sodium  Date/Time Value Ref Range Status  08/03/2019 01:04 PM 114 (LL) 135 - 145 mmol/L Final  05/04/2015 12:48 AM 125 (L) 135 - 145 mmol/L Final    PMH:   Past Medical History:  Diagnosis Date  . Hypertension     PSH:  No past surgical history on file.  Allergies: No Known Allergies  Medications:   Prior to Admission medications   Medication Sig Start Date End Date Taking? Authorizing Provider  amLODipine (NORVASC) 10 MG tablet Take 10 mg by mouth daily. 06/07/19  Yes [provider]  hydrochlorothiazide (HYDRODIURIL) 25 MG tablet Take 25 mg by mouth daily. 06/07/19  Yes [provider]  losartan (COZAAR) 25 MG tablet Take 25 mg by mouth daily. 04/12/19  Yes [provider]   sildenafil (REVATIO) 20 MG tablet Take 40 mg by mouth daily as needed (for E.D.).  04/12/19  Yes [provider]  amLODipine (NORVASC) 5 MG tablet Take 1 tablet (5 mg total) by mouth daily. Patient not taking: Reported on 07/24/2019 11/24/16   Charles Penner, FNP  methylPREDNISolone (MEDROL DOSEPAK) 4 MG TBPK tablet 6-5-4-3-2-1 po qd Patient not taking: Reported on 08/01/2019 11/24/16   Charles Penner, FNP    Discontinued Meds:   Medications Discontinued During This Encounter  Medication Reason  . OVER THE COUNTER MEDICATION Entry Error    Social History:  reports that he has been smoking. He has never used smokeless tobacco. He reports current alcohol use. He reports that he does not use drugs.  Family History:  No family history on file.  Pertinent items are noted in HPI.  Blood pressure (!) 141/107, pulse (!) 110, temperature 98.6 F (37 C), temperature source Oral, resp. rate (!) 28, height 6\' 1"  (1.854 m), weight 79.4 kg, SpO2 95 %. General appearance: alert, cooperative, cachectic and no distress Head: atraumatic, bitemporal wasting Eyes: negative findings: lids and lashes normal, conjunctivae and sclerae normal and corneas clear Resp: clear to auscultation bilaterally Cardio: tachycardic at 110, no rub GI: soft, non-tender; bowel sounds normal; no masses,  no organomegaly Extremities: extremities normal, atraumatic, no cyanosis or edema Neuro: nonfocal  Labs: Basic Metabolic Panel: Recent  Labs  Lab Aug 11, 2019 1304  NA 114*  K 3.0*  CL 80*  CO2 17*  GLUCOSE 126*  BUN 7  CREATININE 0.77  CALCIUM 8.5*   Liver Function Tests: No results for input(s): AST, ALT, ALKPHOS, BILITOT, PROT, ALBUMIN in the last 168 hours. No results for input(s): LIPASE, AMYLASE in the last 168 hours. No results for input(s): AMMONIA in the last 168 hours. CBC: Recent Labs  Lab 11-Aug-2019 1304  WBC 10.9*  HGB 12.3*  HCT 33.1*  MCV 86.4  PLT 235    PT/INR: @labrcntip (inr:5) Cardiac Enzymes: No results for input(s): CKTOTAL, CKMB, CKMBINDEX, TROPONINI in the last 168 hours. CBG: No results for input(s): GLUCAP in the last 168 hours.  Iron Studies: No results for input(s): IRON, TIBC, TRANSFERRIN, FERRITIN in the last 168 hours.  Xrays/Other Studies: DG Chest Portable 1 View  Result Date: Aug 11, 2019 CLINICAL DATA:  Shortness of breath and chest pain EXAM: PORTABLE CHEST 1 VIEW COMPARISON:  May 04, 2015 FINDINGS: There is diffuse interstitial thickening in the lung bases. The lungs elsewhere are clear. Heart size and pulmonary vascularity are normal. No adenopathy. No bone lesions. IMPRESSION: Diffuse interstitial thickening in the lung bases. Question atypical organism pneumonitis in the lung bases versus fibrosis. Both entities may be present concurrently. Lungs otherwise clear. Cardiac silhouette within normal limits. No adenopathy. Electronically Signed   By: May 06, 2015 III M.D.   On: 08-11-2019 13:57     Assessment/Plan: 1.  Severe hyponatremia (likely on chronic) with mild symptoms (dizziness).  His history is consistent with volume depletion given diarrhea and poor po intake further exacerbated by use of ARB/HCT.  Recommend admission to SDU as he will likely require hypertonic saline and frequent lab draws.  I ordered stat repeat bmet and urine studies as these labs were drawn over 3 hours ago.  I would recommend hypertonic saline at 0.25 mL/kg/hr and follow labs every 2 hours.  Our goal is to raise his sodium level to 120 over the next 12-24 hours.  Will need to monitor closely for pulmonary edema. 2. Interstitial thickening of the lung bases- concerning given his smoking history and hiccups.  He may benefit from an high resolution CT scan but will follow pulmonary status for now 3. HTN- continue to hold ARB and HCTZ. 4. Tobacco abuse- stressed smoking cessation 5. Diarrhea- concerning for covid-19 even though POC was  negative, and nasal swab pending 6. SOB- possibly related to pulmonary process as above. O2 sats 98% on 2 liters via [place]   Charles Walters 08/11/19, 4:34 PM

## 2019-07-25 NOTE — ED Notes (Signed)
Sodium reported 114 to [place], [place].

## 2019-07-25 NOTE — Progress Notes (Signed)
Pt is on q2 hour serum sodium draws. NP has been watching for results. Last one at 1900 hrs. Called RN who stated they were "in with a stemi". NP told RN the sodium checks were very important and not to let pt go more than 2 hours without checking. Order on chart to call every sodium result.  KJKG, NP [facility]

## 2019-07-25 NOTE — H&P (Addendum)
History and Physical:    Charles Walters   OIB:[id number] DOB: Feb 11, 1961 DOA: 07/15/2019  Referring MD/provider: Dr Freida Busman PCP: Hilbert Corrigan Chales Abrahams, NP   Patient coming from: Home  Chief Complaint: Hiccups  History of Present Illness:   Charles Walters is an 59 y.o. male with past medical history significant for HTN who presented to [facility] ED for treatment of hiccups.  Patient states that he was in his usual state of reasonable health until 1 week ago when he developed intractable hiccups as well as loss of appetite, watery diarrhea which occurs 1 time a day and some dyspnea on exertion.  Patient states he rested and was treating himself conservatively however the hiccups bothered him so much that he was unable to eat.  When the hiccups did not resolve he came to the ED.  Patient states that he feels generally well according to him.  He does admit that he is short of breath with ambulation but is not short of breath at rest.  Patient denies fevers or chills.  He denies any cough or hemoptysis.  He does admit to decreased appetite as well as inability to eat due to intractable hiccups.  Denies any abdominal pain.  No profound fatigue or malaise.  Not sure if he has had any exposure to Covid.  ED Course:  The patient was noted to be hemodynamically stable although he did have an oxygen requirement which was apparently new.  Chest x-ray was noted to have either pulmonary fibrosis or atypical pneumonia or both.  Electrolytes were notable for sodium of 114 and potassium of 3.0.  Nephrology was consulted for treatment of hyponatremia.  Patient was started on hypertonic saline.  ROS:   ROS   Review of Systems: General: Denies fever, chills, malaise,  Skin: Denies new rashes, lesions, wounds Endocrine: Denies heat/cold intolerance, polyuria or weight loss. Cardiovascular: Denies chest pain or palpitations GI: Denies nausea, vomiting constipation, he does have 1 watery stool a day over the past  week. GU: Denies dysuria, frequency or hematuria CNS: Denies HA, dizziness, confusion, new weakness or clumsiness. Musculoskeletal: Denies new back or joint pain Blood/lymphatics: Denies easy bruising or bleeding Mood/affect: Denies anxiety/depression    Past Medical History:   Past Medical History:  Diagnosis Date  . Hypertension     Past Surgical History:   No past surgical history on file.  Social History:   Social History   Socioeconomic History  . Marital status: Single    Spouse name: Not on file  . Number of children: Not on file  . Years of education: Not on file  . Highest education level: Not on file  Occupational History  . Not on file  Tobacco Use  . Smoking status: Current Every Day Smoker  . Smokeless tobacco: Never Used  Substance and Sexual Activity  . Alcohol use: Yes  . Drug use: No  . Sexual activity: Not on file  Other Topics Concern  . Not on file  Social History Narrative  . Not on file   Social Determinants of Health   Financial Resource Strain:   . Difficulty of Paying Living Expenses: Not on file  Food Insecurity:   . Worried About Programme researcher, broadcasting/film/video in the Last Year: Not on file  . Ran Out of Food in the Last Year: Not on file  Transportation Needs:   . Lack of Transportation (Medical): Not on file  . Lack of Transportation (Non-Medical): Not on file  Physical Activity:   .  Days of Exercise per Week: Not on file  . Minutes of Exercise per Session: Not on file  Stress:   . Feeling of Stress : Not on file  Social Connections:   . Frequency of Communication with Friends and Family: Not on file  . Frequency of Social Gatherings with Friends and Family: Not on file  . Attends Religious Services: Not on file  . Active Member of Clubs or Organizations: Not on file  . Attends Archivist Meetings: Not on file  . Marital Status: Not on file  Intimate Partner Violence:   . Fear of Current or Ex-Partner: Not on file  .  Emotionally Abused: Not on file  . Physically Abused: Not on file  . Sexually Abused: Not on file    Allergies   Patient has no known allergies.  Family history:   No family history on file.  Current Medications:   Prior to Admission medications   Medication Sig Start Date End Date Taking? Authorizing Provider  amLODipine (NORVASC) 10 MG tablet Take 10 mg by mouth daily. 06/07/19  Yes [provider]  hydrochlorothiazide (HYDRODIURIL) 25 MG tablet Take 25 mg by mouth daily. 06/07/19  Yes [provider]  losartan (COZAAR) 25 MG tablet Take 25 mg by mouth daily. 04/12/19  Yes [provider]  sildenafil (REVATIO) 20 MG tablet Take 40 mg by mouth daily as needed (for E.D.).  04/12/19  Yes [provider]  amLODipine (NORVASC) 5 MG tablet Take 1 tablet (5 mg total) by mouth daily. Patient not taking: Reported on 08/01/2019 11/24/16   Lysbeth Penner, FNP  methylPREDNISolone (MEDROL DOSEPAK) 4 MG TBPK tablet 6-5-4-3-2-1 po qd Patient not taking: Reported on 07/23/2019 11/24/16   Lysbeth Penner, FNP    Physical Exam:   Vitals:   08/04/2019 1615 07/30/2019 1645 07/22/2019 1715 08/02/2019 1800  BP: (!) 156/119 (!) 136/103 (!) 139/111 (!) 123/98  Pulse: (!) 108 (!) 102 (!) 105 (!) 103  Resp:    (!) 27  Temp:      TempSrc:      SpO2: 97% 99% 96% 98%  Weight:      Height:         Physical Exam: Blood pressure (!) 123/98, pulse (!) 103, temperature 98.6 F (37 C), temperature source Oral, resp. rate (!) 27, height 6\' 1"  (1.854 m), weight 79.4 kg, SpO2 98 %. Gen: Thin somewhat tired appearing man lying in bed in no acute distress.  No tachypnea or respiratory distress. Eyes: sclera anicteric, conjuctiva laterally injected, no lid lag, no exophthalmos, EOMI CVS: S1-S2 clear, 2/6 systolic murmur, no LE edema, normal pedal pulses  Respiratory: Very coarse rales one quarter of the way up bilaterally.  No wheezes.   GI: NABS, soft, NT, ND, no palpable  masses.  LE: No edema. No cyanosis Neuro: A/O x 3, Moving all extremities equally with normal strength, CN 3-12 intact, grossly nonfocal.  Psych: patient is logical and coherent, judgement and insight appear normal, mood and affect appropriate to situation. Skin: no rashes or lesions or ulcers,    Data Review:    Labs: Basic Metabolic Panel: Recent Labs  Lab 07/29/2019 1304 07/17/2019 1611 07/21/2019 1800  NA 114* 114* 116*  K 3.0* 3.4*  --   CL 80* 79*  --   CO2 17* 20*  --   GLUCOSE 126* 107*  --   BUN 7 7  --   CREATININE 0.77 0.64  --   CALCIUM  8.5* 8.6*  --    Liver Function Tests: No results for input(s): AST, ALT, ALKPHOS, BILITOT, PROT, ALBUMIN in the last 168 hours. No results for input(s): LIPASE, AMYLASE in the last 168 hours. No results for input(s): AMMONIA in the last 168 hours. CBC: Recent Labs  Lab 07/30/2019 1304  WBC 10.9*  HGB 12.3*  HCT 33.1*  MCV 86.4  PLT 235   Cardiac Enzymes: No results for input(s): CKTOTAL, CKMB, CKMBINDEX, TROPONINI in the last 168 hours.  BNP (last 3 results) No results for input(s): PROBNP in the last 8760 hours. CBG: No results for input(s): GLUCAP in the last 168 hours.  Urinalysis No results found for: COLORURINE, APPEARANCEUR, LABSPEC, PHURINE, GLUCOSEU, HGBUR, BILIRUBINUR, KETONESUR, PROTEINUR, UROBILINOGEN, NITRITE, LEUKOCYTESUR    Radiographic Studies: DG Chest Portable 1 View  Result Date: 07/29/2019 CLINICAL DATA:  Shortness of breath and chest pain EXAM: PORTABLE CHEST 1 VIEW COMPARISON:  May 04, 2015 FINDINGS: There is diffuse interstitial thickening in the lung bases. The lungs elsewhere are clear. Heart size and pulmonary vascularity are normal. No adenopathy. No bone lesions. IMPRESSION: Diffuse interstitial thickening in the lung bases. Question atypical organism pneumonitis in the lung bases versus fibrosis. Both entities may be present concurrently. Lungs otherwise clear. Cardiac silhouette within  normal limits. No adenopathy. Electronically Signed   By: Bretta Bang III M.D.   On: 07/28/2019 13:57    EKG: Independently reviewed.  Sinus tach at 130.  P pulmonale.  P mitrale.  Q V1 and V2.  Possible LVH.   Assessment/Plan:   Principal Problem:   Hyponatremia Active Problems:   Intractable hiccups   Diarrhea   Asthenia   Essential hypertension  59 year old presents with intractable hiccups.  Work-up is notable for hyponatremia with sodium of 114, abnormal chest x-ray with either fibrosis or atypical pneumonia or both and some signs and symptoms symptoms possibly suggestive of Covid.  HYPONATREMIA Etiology of hyponatremia is likely multifactorial including combination of HCTZ, losartan, diarrhea, decreased p.o. 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 Urine studies are pending. Very much appreciate renal input management of profound, asymptomatic hyponatremia. Hypertonic saline per their recommendations as ordered. Very much appreciate pharmacy assistance with management of sodium. Will follow sodium every 2 hours to avoid overcorrection too quickly.  Discussed with RN Craige Cotta for overnight follow-up.  ASTHENIA  Concern for Covid Covid antigen is negative but the PCR is still pending I have ordered inflammatory markers which are still pending Patient's anorexia, asthenia, diarrhea and perhaps atypical pneumonia are certainly very suggestive for Covid Will place patient on special precautions as a PUI.  ABNORMAL CXR Patient with abnormal chest x-ray with either pulmonary fibrosis or atypical pneumonia or both. Patient's WBC is borderline high although is doubled from his baseline WBC from 4 years ago. We will start patient on ceftriaxone and azithromycin presumptively for possible CAP. Procalcitonin is ordered and pending. Noncontrast CT is ordered and pending.  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 Echocardiogram requested.  DIARRHEA Patient states he has been having diarrhea for a week, however states is only 1 time a day liquid stool Follow ins and outs carefully  HTN Continue amlodipine Hold HCTZ and losartan given profound hyponatremia  HICCUPS Hopefully patient's hiccups will resolve with treatment of hyponatremia if not could consider other pharmacological interventions  although many antipsychotics are also associated with hyponatremia.     Other information:   DVT prophylaxis: Lovenox ordered. Code Status: Full Family Communication: No need to contact family per patient Disposition Plan: Home Consults called: Renal Admission status: Inpatient  Jeilyn Reznik Tublu Zakkary Thibault [facility]  If 7PM-7AM, please contact night-coverage [email or web address] Password [facility] 07/15/2019, 6:53 PM

## 2019-07-25 NOTE — ED Triage Notes (Signed)
Pt presents w/SOB, Right side chest pain, hiccups and BLE weakness x1 week. PCP sent him to ED for further evaluation

## 2019-07-25 NOTE — ED Provider Notes (Addendum)
[place] EMERGENCY DEPARTMENT Provider Note   CSN: [id number] Arrival date & time: 08/14/19  1229     History Chief Complaint  Patient presents with  . Shortness of Breath    Charles Walters is a 59 y.o. male.  59 year old male with history of tobacco use presents with hiccups x2 weeks.  Patient also notes that he has been short of breath as well as had several right-sided pleuritic chest pain.  Denies any fever or chills.  No hemoptysis.  No recent history of weight loss.  No leg pain or swelling.  Hiccups wax and wane and nothing makes them better or worse.  Called his doctor and was told to come in for further evaluation.        Past Medical History:  Diagnosis Date  . Hypertension     There are no problems to display for this patient.   No past surgical history on file.     No family history on file.  Social History   Tobacco Use  . Smoking status: Current Every Day Smoker  . Smokeless tobacco: Never Used  Substance Use Topics  . Alcohol use: Yes  . Drug use: No    Home Medications Prior to Admission medications   Medication Sig Start Date End Date Taking? Authorizing Provider  amLODipine (NORVASC) 5 MG tablet Take 1 tablet (5 mg total) by mouth daily. 11/24/16   Lysbeth Penner, FNP  methylPREDNISolone (MEDROL DOSEPAK) 4 MG TBPK tablet 6-5-4-3-2-1 po qd 11/24/16   Lysbeth Penner, FNP  OVER THE COUNTER MEDICATION Patient takes one type of blood pressure medicine and started this med 4 months ago.  ?l..p"    [provider]    Allergies    Patient has no known allergies.  Review of Systems   Review of Systems  All other systems reviewed and are negative.   Physical Exam Updated Vital Signs BP (!) 148/97 (BP Location: Right Arm)   Pulse (!) 110   Temp 98.4 F (36.9 C) (Oral)   Resp (!) 28   Ht 1.854 m (6\' 1" )   Wt 79.4 kg   SpO2 97%   BMI 23.09 kg/m   Physical Exam Vitals and nursing note reviewed.    Constitutional:      General: He is not in acute distress.    Appearance: Normal appearance. He is well-developed. He is not toxic-appearing.  HENT:     Head: Normocephalic and atraumatic.  Eyes:     General: Lids are normal.     Conjunctiva/sclera: Conjunctivae normal.     Pupils: Pupils are equal, round, and reactive to light.  Neck:     Thyroid: No thyroid mass.     Trachea: No tracheal deviation.  Cardiovascular:     Rate and Rhythm: Regular rhythm. Tachycardia present.     Heart sounds: Normal heart sounds. No murmur. No gallop.   Pulmonary:     Effort: Pulmonary effort is normal. No respiratory distress.     Breath sounds: Normal breath sounds. No stridor. No decreased breath sounds, wheezing, rhonchi or rales.  Abdominal:     General: Bowel sounds are normal. There is no distension.     Palpations: Abdomen is soft.     Tenderness: There is no abdominal tenderness. There is no rebound.  Musculoskeletal:        General: No tenderness. Normal range of motion.     Cervical back: Normal range of motion and neck supple.  Skin:  General: Skin is warm and dry.     Findings: No abrasion or rash.  Neurological:     Mental Status: He is alert and oriented to person, place, and time.     GCS: GCS eye subscore is 4. GCS verbal subscore is 5. GCS motor subscore is 6.     Cranial Nerves: No cranial nerve deficit.     Sensory: No sensory deficit.  Psychiatric:        Speech: Speech normal.        Behavior: Behavior normal.     ED Results / Procedures / Treatments   Labs (all labs ordered are listed, but only abnormal results are displayed) Labs Reviewed  CBC - Abnormal; Notable for the following components:      Result Value   WBC 10.9 (*)    RBC 3.83 (*)    Hemoglobin 12.3 (*)    HCT 33.1 (*)    MCHC 37.2 (*)    RDW 11.2 (*)    All other components within normal limits  BASIC METABOLIC PANEL  POC SARS CORONAVIRUS 2 AG -  ED  TROPONIN I (HIGH SENSITIVITY)     EKG EKG Interpretation  Date/Time:  Wednesday 2019/07/30 12:54:56 EST Ventricular Rate:  128 PR Interval:  126 QRS Duration: 76 QT Interval:  334 QTC Calculation: 487 R Axis:   63 Text Interpretation: Sinus tachycardia Right atrial enlargement Anteroseptal infarct , age undetermined Abnormal ECG Confirmed by Lorre Nick ([id number]) on 30-Jul-2019 1:47:24 PM   Radiology No results found.  Procedures Procedures (including critical care time)  Medications Ordered in ED Medications  sodium chloride flush (NS) 0.9 % injection 3 mL (3 mLs Intravenous Not Given 30-Jul-2019 1323)    ED Course  I have reviewed the triage vital signs and the nursing notes.  Pertinent labs & imaging results that were available during my care of the patient were reviewed by me and considered in my medical decision making (see chart for details).    MDM Rules/Calculators/A&P                     Patient with mild hypokalemia and treated with oral potassium. Patient here with hyponatremia and will admit to the hospital for further evaluation Final Clinical Impression(s) / ED Diagnoses Final diagnoses:  None    Rx / DC Orders ED Discharge Orders    None       Lorre Nick, MD Jul 30, 2019 1456    Lorre Nick, MD 07-30-19 1457

## 2019-07-25 NOTE — ED Notes (Signed)
States CP elevated with episodes of hiccups.  Hiccups have been persistent x 2 weeks without resolve.

## 2019-07-25 NOTE — ED Notes (Signed)
1700 sodium not collected as Hypertonic saline not started as of yet.  Pharmacy has been contacted.

## 2019-07-26 ENCOUNTER — Inpatient Hospital Stay (HOSPITAL_COMMUNITY): Payer: Medicaid Other

## 2019-07-26 DIAGNOSIS — I361 Nonrheumatic tricuspid (valve) insufficiency: Secondary | ICD-10-CM

## 2019-07-26 LAB — CBC
HCT: 31.1 % — ABNORMAL LOW (ref 39.0–52.0)
HCT: 32.3 % — ABNORMAL LOW (ref 39.0–52.0)
Hemoglobin: 11.7 g/dL — ABNORMAL LOW (ref 13.0–17.0)
Hemoglobin: 12.1 g/dL — ABNORMAL LOW (ref 13.0–17.0)
MCH: 32.1 pg (ref 26.0–34.0)
MCH: 32.7 pg (ref 26.0–34.0)
MCHC: 37.5 g/dL — ABNORMAL HIGH (ref 30.0–36.0)
MCHC: 37.6 g/dL — ABNORMAL HIGH (ref 30.0–36.0)
MCV: 85.7 fL (ref 80.0–100.0)
MCV: 86.9 fL (ref 80.0–100.0)
Platelets: 198 10*3/uL (ref 150–400)
Platelets: 217 10*3/uL (ref 150–400)
RBC: 3.58 MIL/uL — ABNORMAL LOW (ref 4.22–5.81)
RBC: 3.77 MIL/uL — ABNORMAL LOW (ref 4.22–5.81)
RDW: 11.2 % — ABNORMAL LOW (ref 11.5–15.5)
RDW: 11.3 % — ABNORMAL LOW (ref 11.5–15.5)
WBC: 9.5 10*3/uL (ref 4.0–10.5)
WBC: 9.8 10*3/uL (ref 4.0–10.5)
nRBC: 0 % (ref 0.0–0.2)
nRBC: 0 % (ref 0.0–0.2)

## 2019-07-26 LAB — COMPREHENSIVE METABOLIC PANEL
ALT: 22 U/L (ref 0–44)
AST: 31 U/L (ref 15–41)
Albumin: 2.2 g/dL — ABNORMAL LOW (ref 3.5–5.0)
Alkaline Phosphatase: 59 U/L (ref 38–126)
Anion gap: 15 (ref 5–15)
BUN: 10 mg/dL (ref 6–20)
CO2: 16 mmol/L — ABNORMAL LOW (ref 22–32)
Calcium: 8.4 mg/dL — ABNORMAL LOW (ref 8.9–10.3)
Chloride: 89 mmol/L — ABNORMAL LOW (ref 98–111)
Creatinine, Ser: 0.68 mg/dL (ref 0.61–1.24)
GFR calc Af Amer: >60 mL/min (ref >60)
GFR calc non Af Amer: >60 mL/min (ref >60)
Glucose, Bld: 98 mg/dL (ref 70–99)
Potassium: 3.9 mmol/L (ref 3.5–5.1)
Sodium: 120 mmol/L — ABNORMAL LOW (ref 135–145)
Total Bilirubin: 1.8 mg/dL — ABNORMAL HIGH (ref 0.3–1.2)
Total Protein: 7.1 g/dL (ref 6.5–8.1)

## 2019-07-26 LAB — SODIUM
Sodium: 117 mmol/L — CL (ref 135–145)
Sodium: 118 mmol/L — CL (ref 135–145)
Sodium: 119 mmol/L — CL (ref 135–145)
Sodium: 120 mmol/L — ABNORMAL LOW (ref 135–145)
Sodium: 121 mmol/L — ABNORMAL LOW (ref 135–145)
Sodium: 121 mmol/L — ABNORMAL LOW (ref 135–145)
Sodium: 122 mmol/L — ABNORMAL LOW (ref 135–145)
Sodium: 122 mmol/L — ABNORMAL LOW (ref 135–145)
Sodium: 122 mmol/L — ABNORMAL LOW (ref 135–145)

## 2019-07-26 LAB — ECHOCARDIOGRAM COMPLETE
Height: 73 in
Weight: 2271.62 oz

## 2019-07-26 LAB — CREATININE, SERUM
Creatinine, Ser: 0.68 mg/dL (ref 0.61–1.24)
GFR calc Af Amer: >60 mL/min (ref >60)
GFR calc non Af Amer: >60 mL/min (ref >60)

## 2019-07-26 LAB — RESPIRATORY PANEL BY PCR

## 2019-07-26 LAB — HIV ANTIBODY (ROUTINE TESTING W REFLEX): HIV Screen 4th Generation wRfx: NONREACTIVE

## 2019-07-26 LAB — D-DIMER, QUANTITATIVE: D-Dimer, Quant: 2.51 ug/mL-FEU — ABNORMAL HIGH (ref 0.00–0.50)

## 2019-07-26 LAB — C-REACTIVE PROTEIN: CRP: 13.9 mg/dL — ABNORMAL HIGH (ref <1.0)

## 2019-07-26 LAB — TSH: TSH: 1.402 u[IU]/mL (ref 0.350–4.500)

## 2019-07-26 LAB — PHOSPHORUS: Phosphorus: 3.1 mg/dL (ref 2.5–4.6)

## 2019-07-26 LAB — MRSA PCR SCREENING: MRSA by PCR: NEGATIVE

## 2019-07-26 MED ORDER — PNEUMOCOCCAL VAC POLYVALENT 25 MCG/0.5ML IJ INJ
0.5000 mL | INJECTION | INTRAMUSCULAR | Status: AC
  Administered 2019-07-28: 0.5 mL via INTRAMUSCULAR
  Filled 2019-07-26: qty 0.5

## 2019-07-26 MED ORDER — VANCOMYCIN HCL 1250 MG/250ML IV SOLN
1250.0000 mg | Freq: Two times a day (BID) | INTRAVENOUS | Status: DC
  Administered 2019-07-26 – 2019-07-27 (×2): 1250 mg via INTRAVENOUS
  Filled 2019-07-26 (×3): qty 250

## 2019-07-26 MED ORDER — ADULT MULTIVITAMIN W/MINERALS CH
1.0000 | ORAL_TABLET | Freq: Every day | ORAL | Status: DC
  Administered 2019-07-26 – 2019-08-11 (×14): 1 via ORAL
  Filled 2019-07-26 (×15): qty 1

## 2019-07-26 MED ORDER — CHLORPROMAZINE HCL 25 MG/ML IJ SOLN
25.0000 mg | Freq: Once | INTRAMUSCULAR | Status: AC
  Administered 2019-07-26: 25 mg via INTRAMUSCULAR
  Filled 2019-07-26: qty 1

## 2019-07-26 MED ORDER — ENSURE ENLIVE PO LIQD: 237.0000 mL | Freq: Two times a day (BID) | ORAL | Status: DC

## 2019-07-26 MED ORDER — IOHEXOL 350 MG/ML SOLN
80.0000 mL | Freq: Once | INTRAVENOUS | Status: AC | PRN
  Administered 2019-07-26: 80 mL via INTRAVENOUS

## 2019-07-26 MED ORDER — SODIUM CHLORIDE 0.9 % IV SOLN
2.0000 g | Freq: Three times a day (TID) | INTRAVENOUS | Status: DC
  Administered 2019-07-26 – 2019-07-28 (×5): 2 g via INTRAVENOUS
  Filled 2019-07-26 (×8): qty 2

## 2019-07-26 MED ORDER — ORAL CARE MOUTH RINSE
15.0000 mL | Freq: Two times a day (BID) | OROMUCOSAL | Status: DC
  Administered 2019-07-26 – 2019-08-10 (×21): 15 mL via OROMUCOSAL

## 2019-07-26 MED ORDER — SODIUM CHLORIDE 0.9 % IV SOLN: INTRAVENOUS | Status: AC

## 2019-07-26 MED ORDER — ENSURE ENLIVE PO LIQD
237.0000 mL | Freq: Three times a day (TID) | ORAL | Status: DC
  Administered 2019-07-26 – 2019-08-05 (×22): 237 mL via ORAL

## 2019-07-26 NOTE — Progress Notes (Signed)
CRITICAL VALUE ALERT  Critical Value:  Sodium: 117  Date & Time Notified:  07/26/19 at 0047  Provider Notified: Craige Cotta notified at 0048  Orders Received/Actions taken: Awaiting orders

## 2019-07-26 NOTE — Significant Event (Signed)
Rapid Response Event Note  Overview: Called d/t MEWS-4 for HR-115, BP-100/65, and RR-25. This is not an acute change. Please call RRT if assistance needed.        Terrilyn Saver

## 2019-07-26 NOTE — Progress Notes (Addendum)
Initial Nutrition Assessment  DOCUMENTATION CODES:   Not applicable  INTERVENTION:    Ensure Enlive po TID, each supplement provides 350 kcal and 20 grams of protein  MVI daily   NUTRITION DIAGNOSIS:   Increased nutrient needs related to acute illness as evidenced by estimated needs.  GOAL:   Patient will meet greater than or equal to 90% of their needs  MONITOR:   PO intake, Supplement acceptance, Weight trends, Labs, I & O's  REASON FOR ASSESSMENT:   Malnutrition Screening Tool    ASSESSMENT:   Patient with PMH significant for HTN. Presents this admission with hyponatremia.   Per chest xray, pt shows to have pulmonary fibrosis or atypical PNA.   Attempted to call pt, no answer. Per H&P, pt developed hiccups one week ago and this prevented him from eating. No meal completions documented this admission. RD to provide supplementation to maximize kcal and protein this admission.   Records lack weight history over the last year. Will attempt to obtain nutrition/weight history if possible. Suspect some form of malnutrition but unable to diagnose without NFPE or history.   I/O: +920 ml since admit UOP: 100 mlx 24 hrs   Drips: NS @ 75 ml/hr  Labs: Na 119 (L)   Diet Order:   Diet Order            Diet regular Room service appropriate? Yes; Fluid consistency: Thin  Diet effective now              EDUCATION NEEDS:   Not appropriate for education at this time  Skin:  Skin Assessment: Reviewed RN Assessment  Last BM:  2/18  Height:   Ht Readings from Last 1 Encounters:  07/26/19 6\' 1"  (1.854 m)    Weight:   Wt Readings from Last 1 Encounters:  07/26/19 64.4 kg    Ideal Body Weight:  83.6 kg  BMI:  Body mass index is 18.73 kg/m.  Estimated Nutritional Needs:   Kcal:  2000-2200 kcal  Protein:  100-120 grams  Fluid:  >/= 2 L/day   07/28/19 RD, LDN Clinical Nutrition Pager listed in AMION

## 2019-07-26 NOTE — Progress Notes (Addendum)
CRITICAL VALUE ALERT  Critical Value:  118   Date & Time Notified:  07/26/19 at 0108   Provider Notified: Craige Cotta at  0110  Orders Received/Actions taken: Awaiting orders - paged Craige Cotta to verify whether or not she wanted to transition patient to NS after reaching goal of (118-120 sodium level) or wait for next lab draw at 0300 before transitioning patient's fluids.

## 2019-07-26 NOTE — Progress Notes (Signed)
Patient ID: Charles Walters, male   DOB: [birth year], 59 y.o.   MRN: [id number] S: Still complaining of shortness of breath but not dizzy. O:BP (!) 119/91 (BP Location: Right Arm)   Pulse (!) 109   Temp 98.1 F (36.7 C) (Oral)   Resp (!) 33   Ht 6\' 1"  (1.854 m)   Wt 64.4 kg Comment: Shoes + telemetry box removed at time of weighing  SpO2 98%   BMI 18.73 kg/m   Intake/Output Summary (Last 24 hours) at 07/26/2019 1006 Last data filed at 07/26/2019 0700 Gross per 24 hour  Intake 919.51 ml  Output 100 ml  Net 819.51 ml   Intake/Output: I/O last 3 completed shifts: In: 919.5 [P.O.:240; I.V.:339.4; IV Piggyback:340.2] Out: 100 [Urine:100]  Intake/Output this shift:  No intake/output data recorded. Weight change:  Gen: cachectic AAM lying in bed with nasal canula in NAD CVS: tachy at 109 Resp: tachypnea at 33, cta Abd: +BS, soft, Nt/Nd Ext: no edema  Recent Labs  Lab 08/13/19 1304 2019-08-13 1304 August 13, 2019 1611 Aug 13, 2019 1800 2019-08-13 1900 2019-08-13 2348 13-Aug-2019 2349 07/26/19 0029 07/26/19 0231 07/26/19 0508 07/26/19 0651 07/26/19 0839  NA 114*   < > 114*   < > 115*  --  117* 118* 120* 120* 122* 122*  K 3.0*  --  3.4*  --   --   --   --   --   --  3.9  --   --   CL 80*  --  79*  --   --   --   --   --   --  89*  --   --   CO2 17*  --  20*  --   --   --   --   --   --  16*  --   --   GLUCOSE 126*  --  107*  --   --   --   --   --   --  98  --   --   BUN 7  --  7  --   --   --   --   --   --  10  --   --   CREATININE 0.77  --  0.64  --   --  0.68  --   --   --  0.68  --   --   ALBUMIN  --   --   --   --   --   --   --   --   --  2.2*  --   --   CALCIUM 8.5*  --  8.6*  --   --   --   --   --   --  8.4*  --   --   PHOS  --   --   --   --   --   --   --   --   --  3.1  --   --   AST  --   --   --   --   --   --   --   --   --  31  --   --   ALT  --   --   --   --   --   --   --   --   --  22  --   --    < > = values in this interval not displayed.   Liver Function  Tests: Recent  Labs  Lab 07/26/19 0508  AST 31  ALT 22  ALKPHOS 59  BILITOT 1.8*  PROT 7.1  ALBUMIN 2.2*   No results for input(s): LIPASE, AMYLASE in the last 168 hours. No results for input(s): AMMONIA in the last 168 hours. CBC: Recent Labs  Lab 07/27/2019 1304 07/20/2019 2348 07/26/19 0508  WBC 10.9* 9.8 9.5  HGB 12.3* 11.7* 12.1*  HCT 33.1* 31.1* 32.3*  MCV 86.4 86.9 85.7  PLT 235 217 198   Cardiac Enzymes: No results for input(s): CKTOTAL, CKMB, CKMBINDEX, TROPONINI in the last 168 hours. CBG: No results for input(s): GLUCAP in the last 168 hours.  Iron Studies:  Recent Labs    08/04/2019 1900  FERRITIN 1,286*   Studies/Results: CT CHEST WO CONTRAST  Result Date: 07/23/2019 CLINICAL DATA:  59 year old male with shortness of breath. EXAM: CT CHEST WITHOUT CONTRAST TECHNIQUE: Multidetector CT imaging of the chest was performed following the standard protocol without IV contrast. COMPARISON:  Chest radiograph dated 08/02/2019. FINDINGS: Evaluation of this exam is limited in the absence of intravenous contrast. Cardiovascular: There is no cardiomegaly or pericardial effusion. Advanced 3 vessel coronary vascular calcification noted. There is hypoattenuation of the cardiac blood pool suggestive of a degree of anemia. Clinical correlation is recommended. There is mild aneurysmal dilatation of the ascending aorta measuring up to 4.2 cm in diameter. The central pulmonary arteries are grossly unremarkable on this noncontrast CT. Mediastinum/Nodes: There is no hilar or mediastinal adenopathy. The esophagus and the thyroid gland are grossly unremarkable. No mediastinal fluid collection. Lungs/Pleura: There is diffuse interstitial and interlobular septal thickening and ground-glass airspace opacity of the lung bases. Scattered areas of subpleural ground-glass density also noted. Findings concerning for an atypical infection. Clinical correlation is recommended. There is no pleural effusion or pneumothorax.  The central airways are patent. Upper Abdomen: Fatty infiltration of the liver. Musculoskeletal: Degenerative changes of the spine. No acute osseous pathology. IMPRESSION: 1. Findings concerning for multifocal pneumonia, possibly viral or atypical in etiology. Clinical correlation and follow-up recommended. 2. 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 Recommend annual imaging followup by CTA or MRA. This recommendation follows 2010 ACCF/AHA/AATS/ACR/ASA/SCA/SCAI/SIR/STS/SVM Guidelines for the Diagnosis and Management of Patients with Thoracic Aortic Disease. Circulation. 2010; 121: H417-E081. Aortic aneurysm NOS (ICD10-I71.9) 3. Three vessel coronary vascular calcification. 4. Fatty liver. Electronically Signed   By: Elgie Collard M.D.   On: 07/12/2019 21:10   DG Chest Portable 1 View  Result Date: 07/09/2019 CLINICAL DATA:  Shortness of breath and chest pain EXAM: PORTABLE CHEST 1 VIEW COMPARISON:  May 04, 2015 FINDINGS: There is diffuse interstitial thickening in the lung bases. The lungs elsewhere are clear. Heart size and pulmonary vascularity are normal. No adenopathy. No bone lesions. IMPRESSION: Diffuse interstitial thickening in the lung bases. Question atypical organism pneumonitis in the lung bases versus fibrosis. Both entities may be present concurrently. Lungs otherwise clear. Cardiac silhouette within normal limits. No adenopathy. Electronically Signed   By: Bretta Bang III M.D.   On: 07/29/2019 13:57   . amLODipine  10 mg Oral Daily  . enoxaparin (LOVENOX) injection  40 mg Subcutaneous QHS  . feeding supplement (ENSURE ENLIVE)  237 mL Oral BID BM  . mouth rinse  15 mL Mouth Rinse BID  . [START ON 07/27/2019] pneumococcal 23 valent vaccine  0.5 mL Intramuscular Tomorrow-1000  . sodium chloride flush  3 mL Intravenous Once    BMET  Component Value Date/Time   NA 122 (L) 07/26/2019 0839   K 3.9 07/26/2019 0508   CL 89 (L)  07/26/2019 0508   CO2 16 (L) 07/26/2019 0508   GLUCOSE 98 07/26/2019 0508   BUN 10 07/26/2019 0508   CREATININE 0.68 07/26/2019 0508   CALCIUM 8.4 (L) 07/26/2019 0508   GFRNONAA >60 07/26/2019 0508   GFRAA >60 07/26/2019 0508   CBC    Component Value Date/Time   WBC 9.5 07/26/2019 0508   RBC 3.77 (L) 07/26/2019 0508   HGB 12.1 (L) 07/26/2019 0508   HCT 32.3 (L) 07/26/2019 0508   PLT 198 07/26/2019 0508   MCV 85.7 07/26/2019 0508   MCH 32.1 07/26/2019 0508   MCHC 37.5 (H) 07/26/2019 0508   RDW 11.3 (L) 07/26/2019 0508   LYMPHSABS 1.8 05/04/2015 0048   MONOABS 0.7 05/04/2015 0048   EOSABS 0.0 05/04/2015 0048   BASOSABS 0.0 05/04/2015 0048     Assessment/Plan:  1. Severe hyponatremia- likely on chronic due to hypovolemia (diarrhea and poor po intake with Urine Na <10).  He was given 3% saline with gradual improvement of serum sodium and then switched to NS and sodium is stable at 122.  Continue with NS for now and decrease rate to 50 ml/hr once Na gets to 124 (do not want to over correct with goal increase of 10 mEq/24 hours) 1. Nothing further to add, will sign off.  Please call with questions or concerns.  2. SOB- concerning given tachypnea, interstitial markings on CXR, tobacco history, and ongoing shortness of breath/hypoxia.  Recommend Pulmonary consult and further evaluation 3. HTN- stable off ARB and HCTZ.  Would not resume either medication given likelihood of chronic hyponatremia due to pulmonary process  4. Diarrhea- ongoing, loose stools, rule out C. Diff 5. Tobacco abuse- stressed smoking cessation.  Irena Cords, MD [employer] [phone]

## 2019-07-26 NOTE — Progress Notes (Signed)
Charles Walters  PROGRESS NOTE    Charles Walters  UXL:[id number] DOB: 1961/02/14 DOA: 08/03/2019 PCP: [name], NP   Brief Narrative:   Charles Walters is an 59 y.o. male with past medical history significant for HTN who presented to [facility] ED for treatment of hiccups.  Patient states that he was in his usual state of reasonable health until 1 week ago when he developed intractable hiccups as well as loss of appetite, watery diarrhea which occurs 1 time a day and some dyspnea on exertion.  Patient states he rested and was treating himself conservatively however the hiccups bothered him so much that he was unable to eat.  When the hiccups did not resolve he came to the ED.  Patient states that he feels generally well according to him.  He does admit that he is short of breath with ambulation but is not short of breath at rest.  Patient denies fevers or chills.  He denies any cough or hemoptysis.  He does admit to decreased appetite as well as inability to eat due to intractable hiccups.  Denies any abdominal pain.  No profound fatigue or malaise.  Not sure if he has had any exposure to Covid.  07/26/19: Na+ coming up on fluids. Appreciate nephro's assistance. Inflammatory markers are improved from yesterday; but d-dimer is still elevated and he has tachypnea. Check CTA PE. He's being covered for CAP. Continue. Add breathing Tx. Echo performed. See below.     Assessment & Plan:   Principal Problem:   Hyponatremia Active Problems:   Intractable hiccups   Diarrhea   Asthenia   Essential hypertension  Hyponatremia     - multifactorial etiology including combination of HCTZ, losartan, diarrhea, decreased p.o. 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     - Appreciate nephro assistance, Na+ is improving; limit to 6 pt improvement per 24 hours     - continuing NS     - continue Na+ checks  Generalized weakness     - PT/OT eval when more stable     - he's COVID  negative  Acute respiratory failure w/ hypoxia Multifocal PNA     - Atypical PNA vs pulmonary fibrosis on CXR     - procal mildly elevated     - COVID negative     - inflamatory markers are up, but improving     - multifocal pneumonia seen on CT     - elevated d-dimer, so ordered CTA PE, results pending     - being treated with CAP coverage, will broaden abx to vanc, cefepime w/ pharm to dose.   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     - Echocardiogram: Left Ventricle: Left ventricular ejection fraction, by estimation, is 60 to 65%. The left ventricle has normal function. The left ventricle has no regional wall motion abnormalities. The left ventricular internal cavity size was normal in size. There is no left ventricular hypertrophy. Left ventricular diastolic parameters are consistent with Grade I diastolic dysfunction (impaired relaxation). Right Ventricle: The right ventricular size is normal. No increase in right ventricular wall thickness. Right ventricular systolic function is  low normal. There is moderately elevated pulmonary artery systolic pressure. The tricuspid regurgitant  velocity is 3.08 m/s, and with an assumed right atrial pressure of 3 mmHg, the  estimated right ventricular systolic pressure is 40.9 mmHg.  Diarrhea     - Patient states he has been having diarrhea for a week, however states is only 1 time a day liquid stool     - c diff ordered, GI PCR ordered  HTN     - continue norvasc     - hold HCTZ, losartan d/t hyponatremia  Hiccups     - not complaining of hiccups this morning, none witness during time in room  DVT prophylaxis: lovenox Code Status: FULL Family Communication: None at bedside   Disposition Plan: Continue IVF, abx; w/u ongoing  Consultants:   Neprhology  Antimicrobials:  Clement Husbands,  cefepime   Subjective: "Ok, ok."  Objective: Vitals:   07/26/19 0100 07/26/19 0306 07/26/19 0646 07/26/19 1021  BP: (!) 159/87 (!) 120/100 (!) 119/91 126/89  Pulse: 97 94 (!) 109 (!) 120  Resp: (!) 21 (!) 24 (!) 33 (!) 26  Temp:  98.1 F (36.7 C) 98.1 F (36.7 C) 97.6 F (36.4 C)  TempSrc:  Oral Oral Oral  SpO2: 97% 96% 98% 100%  Weight:      Height:        Intake/Output Summary (Last 24 hours) at 07/26/2019 1618 Last data filed at 07/26/2019 1500 Gross per 24 hour  Intake 1519.51 ml  Output 300 ml  Net 1219.51 ml   Filed Weights   07/27/2019 1319 07/26/19 0000  Weight: 79.4 kg 64.4 kg    Examination:  General: 59 y.o. male resting in bed in NAD Cardiovascular: tachy, +S1, S2, no m/g/r, equal pulses throughout Respiratory: tachypneic, decreased at bases, some UAT GI: BS+, NDNT, no masses noted, no organomegaly noted MSK: No e/c/c Neuro: alert to name, following some commands Psyc: calm/cooperative   Data Reviewed: I have personally reviewed following labs and imaging studies.  CBC: Recent Labs  Lab 07/22/2019 1304 08/01/2019 2348 07/26/19 0508  WBC 10.9* 9.8 9.5  HGB 12.3* 11.7* 12.1*  HCT 33.1* 31.1* 32.3*  MCV 86.4 86.9 85.7  PLT 235 217 198   Basic Metabolic Panel: Recent Labs  Lab 07/30/2019 1304 08/01/2019 1304 07/24/2019 1611 07/24/2019 1800 07/20/2019 2348 08/02/2019 2349 07/26/19 0508 07/26/19 0651 07/26/19 0839 07/26/19 1042 07/26/19 1329  NA 114*   < > 114*   < >  --    < > 120* 122* 122* 119* 121*  K 3.0*  --  3.4*  --   --   --  3.9  --   --   --   --   CL 80*  --  79*  --   --   --  89*  --   --   --   --   CO2 17*  --  20*  --   --   --  16*  --   --   --   --   GLUCOSE 126*  --  107*  --   --   --  98  --   --   --   --   BUN 7  --  7  --   --   --  10  --   --   --   --   CREATININE 0.77  --  0.64  --  0.68  --  0.68  --   --   --   --   CALCIUM 8.5*  --  8.6*  --   --   --  8.4*  --   --   --   --   PHOS  --   --   --   --   --   --  3.1   --   --   --   --    < > = values in this interval not displayed.   GFR: Estimated Creatinine Clearance: 92.8 mL/min (by C-G formula based on SCr of 0.68 mg/dL). Liver Function Tests: Recent Labs  Lab 07/26/19 0508  AST 31  ALT 22  ALKPHOS 59  BILITOT 1.8*  PROT 7.1  ALBUMIN 2.2*   No results for input(s): LIPASE, AMYLASE in the last 168 hours. No results for input(s): AMMONIA in the last 168 hours. Coagulation Profile: No results for input(s): INR, PROTIME in the last 168 hours. Cardiac Enzymes: No results for input(s): CKTOTAL, CKMB, CKMBINDEX, TROPONINI in the last 168 hours. BNP (last 3 results) No results for input(s): PROBNP in the last 8760 hours. HbA1C: No results for input(s): HGBA1C in the last 72 hours. CBG: No results for input(s): GLUCAP in the last 168 hours. Lipid Profile: No results for input(s): CHOL, HDL, LDLCALC, TRIG, CHOLHDL, LDLDIRECT in the last 72 hours. Thyroid Function Tests: Recent Labs    07/20/2019 2348  TSH 1.402   Anemia Panel: Recent Labs    07/21/2019 1900  FERRITIN 1,286*   Sepsis Labs: Recent Labs  Lab 07/31/2019 1900  PROCALCITON 0.53    Recent Results (from the past 240 hour(s))  SARS CORONAVIRUS 2 (TAT 6-24 HRS) Nasopharyngeal Nasopharyngeal Swab     Status: None   Collection Time: 07/30/2019  2:32 PM   Specimen: Nasopharyngeal Swab  Result Value Ref Range Status   SARS Coronavirus 2 NEGATIVE NEGATIVE Final    Comment: (NOTE) SARS-CoV-2 target nucleic acids are NOT DETECTED. The SARS-CoV-2 RNA is generally detectable in upper and lower respiratory specimens during the acute phase of infection. Negative results do not preclude SARS-CoV-2 infection, do not rule out co-infections with other pathogens, and should not be used as the sole basis for treatment or other patient management decisions. Negative results must be combined with clinical observations, patient history, and epidemiological information. The expected result is  Negative. Fact Sheet for Patients: SugarRoll.be Fact Sheet for Healthcare Providers: https://www.woods-mathews.com/ This test is not yet approved or cleared by the [place] FDA and  has been authorized for detection and/or diagnosis of SARS-CoV-2 by FDA under an Emergency Use Authorization (EUA). This EUA will remain  in effect (meaning this test can be used) for the duration of the COVID-19 declaration under Section 56 4(b)(1) of the Act, 21 U.S.C. section 360bbb-3(b)(1), unless the authorization is terminated or revoked sooner. Performed at [facility], [place] [address], [place], [place] [address]   MRSA PCR Screening     Status: None   Collection Time: 07/26/19 12:05 AM   Specimen: Nasal Mucosa; Nasopharyngeal  Result Value Ref Range Status   MRSA by PCR NEGATIVE NEGATIVE Final    Comment:        The GeneXpert MRSA Assay (FDA approved for NASAL specimens only), is one component of a comprehensive MRSA colonization surveillance program. It is not intended to diagnose MRSA infection nor to guide or monitor treatment for MRSA infections. Performed at [facility], [place] [address], [place], [place] [address]   Respiratory Panel by PCR     Status: None   Collection Time: 07/26/19  2:12 PM   Specimen: Nasopharyngeal Swab; Respiratory  Result Value Ref Range Status   Adenovirus NOT DETECTED NOT DETECTED Final   Coronavirus 229E NOT DETECTED NOT DETECTED Final    Comment: (NOTE) The Coronavirus on the Respiratory Panel, DOES NOT test for the novel  Coronavirus (2019 nCoV)    Coronavirus HKU1 NOT DETECTED NOT DETECTED Final   Coronavirus NL63 NOT DETECTED NOT DETECTED Final   Coronavirus OC43 NOT DETECTED NOT DETECTED Final   Metapneumovirus NOT DETECTED NOT DETECTED Final   Rhinovirus / Enterovirus NOT DETECTED NOT DETECTED Final   Influenza A NOT DETECTED NOT DETECTED Final   Influenza B NOT DETECTED NOT DETECTED Final    Parainfluenza Virus 1 NOT DETECTED NOT DETECTED Final   Parainfluenza Virus 2 NOT DETECTED NOT DETECTED Final   Parainfluenza Virus 3 NOT DETECTED NOT DETECTED Final   Parainfluenza Virus 4 NOT DETECTED NOT DETECTED Final   Respiratory Syncytial Virus NOT DETECTED NOT DETECTED Final   Bordetella pertussis NOT DETECTED NOT DETECTED Final   Chlamydophila pneumoniae NOT DETECTED NOT DETECTED Final   Mycoplasma pneumoniae NOT DETECTED NOT DETECTED Final    Comment: Performed at [facility], [address] [place], [place] [address]      Radiology Studies: CT CHEST WO CONTRAST  Result Date: 07/29/2019 CLINICAL DATA:  59 year old male with shortness of breath. EXAM: CT CHEST WITHOUT CONTRAST TECHNIQUE: Multidetector CT imaging of the chest was performed following the standard protocol without IV contrast. COMPARISON:  Chest radiograph dated 07/15/2019. FINDINGS: Evaluation of this exam is limited in the absence of intravenous contrast. Cardiovascular: There is no cardiomegaly or pericardial effusion. Advanced 3 vessel coronary vascular calcification noted. There is hypoattenuation of the cardiac blood pool suggestive of a degree of anemia. Clinical correlation is recommended. There is mild aneurysmal dilatation of the ascending aorta measuring up to 4.2 cm in diameter. The central pulmonary arteries are grossly unremarkable on this noncontrast CT. Mediastinum/Nodes: There is no hilar or mediastinal adenopathy. The esophagus and the thyroid gland are grossly unremarkable. No mediastinal fluid collection. Lungs/Pleura: There is diffuse interstitial and interlobular septal thickening and ground-glass airspace opacity of the lung bases. Scattered areas of subpleural ground-glass density also noted. Findings concerning for an atypical infection. Clinical correlation is recommended. There is no pleural effusion or pneumothorax. The central airways are patent. Upper Abdomen: Fatty infiltration of the  liver. Musculoskeletal: Degenerative changes of the spine. No acute osseous pathology. IMPRESSION: 1. Findings concerning for multifocal pneumonia, possibly viral or atypical in etiology. Clinical correlation and follow-up recommended. 2. 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 Recommend annual imaging followup by CTA or MRA. This recommendation follows 2010 ACCF/AHA/AATS/ACR/ASA/SCA/SCAI/SIR/STS/SVM Guidelines for the Diagnosis and Management of Patients with Thoracic Aortic Disease. Circulation. 2010; 121: M638-T771. Aortic aneurysm NOS (ICD10-I71.9) 3. Three vessel coronary vascular calcification. 4. Fatty liver. Electronically Signed   By: Elgie Collard M.D.   On: 07/20/2019 21:10   DG Chest Portable 1 View  Result Date: 07/31/2019 CLINICAL DATA:  Shortness of breath and chest pain EXAM: PORTABLE CHEST 1 VIEW COMPARISON:  May 04, 2015 FINDINGS: There is diffuse interstitial thickening in the lung bases. The lungs elsewhere are clear. Heart size and pulmonary vascularity are normal. No adenopathy. No bone lesions. IMPRESSION: Diffuse interstitial thickening in the lung bases. Question atypical organism pneumonitis in the lung bases versus fibrosis. Both entities may be present concurrently. Lungs otherwise clear. Cardiac silhouette within normal limits. No  adenopathy. Electronically Signed   By: Bretta Bang III M.D.   On: July 31, 2019 13:57   ECHOCARDIOGRAM COMPLETE  Result Date: 07/26/2019    ECHOCARDIOGRAM REPORT   Patient Name:   Charles Walters Date of Exam: 07/26/2019 Medical Rec #:  [id number]   Height:       73.0 in Accession #:    [id number]  Weight:       142.0 lb Date of Birth:  1961/04/28   BSA:          1.86 m Patient Age:    58 years    BP:           126/89 mmHg Patient Gender: M           HR:           108 bpm. Exam Location:  Inpatient Procedure: 2D Echo Indications:    dyspnea 786.09  History:        Patient has no prior history of  Echocardiogram examinations.                 Risk Factors:Hypertension and Current Smoker.  Sonographer:    Celene Skeen RDCS (AE) Referring Phys: [id number] [facility] TUBLU CHATTERJEE  Sonographer Comments: Suboptimal parasternal window. IMPRESSIONS  1. Left ventricular ejection fraction, by estimation, is 60 to 65%. The left ventricle has normal function. The left ventricle has no regional wall motion abnormalities. Left ventricular diastolic parameters are consistent with Grade I diastolic dysfunction (impaired relaxation).  2. Right ventricular systolic function is low normal. The right ventricular size is normal. There is moderately elevated pulmonary artery systolic pressure. The estimated right ventricular systolic pressure is 40.9 mmHg.  3. The mitral valve is normal in structure and function. No evidence of mitral valve regurgitation. No evidence of mitral stenosis.  4. Tricuspid valve regurgitation is mild to moderate.  5. The aortic valve is tricuspid. Aortic valve regurgitation is not visualized. No aortic stenosis is present.  6. Aortic dilatation noted. There is mild dilatation of the ascending aorta measuring 43 mm.  7. The inferior vena cava is normal in size with greater than 50% respiratory variability, suggesting right atrial pressure of 3 mmHg. FINDINGS  Left Ventricle: Left ventricular ejection fraction, by estimation, is 60 to 65%. The left ventricle has normal function. The left ventricle has no regional wall motion abnormalities. The left ventricular internal cavity size was normal in size. There is  no left ventricular hypertrophy. Left ventricular diastolic parameters are consistent with Grade I diastolic dysfunction (impaired relaxation). Right Ventricle: The right ventricular size is normal. No increase in right ventricular wall thickness. Right ventricular systolic function is low normal. There is moderately elevated pulmonary artery systolic pressure. The tricuspid regurgitant velocity  is  3.08 m/s, and with an assumed right atrial pressure of 3 mmHg, the estimated right ventricular systolic pressure is 40.9 mmHg. Left Atrium: Left atrial size was normal in size. Right Atrium: Right atrial size was normal in size. Pericardium: Trivial pericardial effusion is present. Mitral Valve: The mitral valve is normal in structure and function. Mild mitral annular calcification. No evidence of mitral valve regurgitation. No evidence of mitral valve stenosis. Tricuspid Valve: The tricuspid valve is normal in structure. Tricuspid valve regurgitation is mild to moderate. Aortic Valve: The aortic valve is tricuspid. Aortic valve regurgitation is not visualized. No aortic stenosis is present. Pulmonic Valve: The pulmonic valve was not well visualized. Pulmonic valve regurgitation is not visualized. Aorta: Aortic dilatation noted. There is mild dilatation  of the ascending aorta measuring 43 mm. Venous: The inferior vena cava is normal in size with greater than 50% respiratory variability, suggesting right atrial pressure of 3 mmHg. IAS/Shunts: No atrial level shunt detected by color flow Doppler.  LEFT VENTRICLE PLAX 2D LVIDd:         4.00 cm Diastology LVIDs:         2.60 cm LV e' lateral:   11.20 cm/s LV PW:         1.10 cm LV E/e' lateral: 6.2 LV IVS:        1.00 cm LV e' medial:    6.09 cm/s LV SV Index:   25.13   LV E/e' medial:  11.4  LEFT ATRIUM         Index LA diam:    2.70 cm 1.45 cm/m  AORTIC VALVE LVOT Vmax:   75.10 cm/s LVOT Vmean:  61.100 cm/s LVOT VTI:    0.156 m  AORTA Ao Root diam: 3.10 cm MITRAL VALVE               TRICUSPID VALVE MV Area (PHT): 2.50 cm    TR Peak grad:   37.9 mmHg MV Decel Time: 304 msec    TR Vmax:        308.00 cm/s MV E velocity: 69.40 cm/s MV A velocity: 76.70 cm/s  SHUNTS MV E/A ratio:  0.90        Systemic VTI: 0.16 m Marca Anconaalton Mclean MD Electronically signed by Marca Anconaalton Mclean MD Signature Date/Time: 07/26/2019/3:56:46 PM    Final      Scheduled Meds: . amLODipine  10 mg  Oral Daily  . enoxaparin (LOVENOX) injection  40 mg Subcutaneous QHS  . feeding supplement (ENSURE ENLIVE)  237 mL Oral TID BM  . mouth rinse  15 mL Mouth Rinse BID  . multivitamin with minerals  1 tablet Oral Daily  . [START ON 07/27/2019] pneumococcal 23 valent vaccine  0.5 mL Intramuscular Tomorrow-1000  . sodium chloride flush  3 mL Intravenous Once   Continuous Infusions: . sodium chloride 75 mL/hr at 07/26/19 0553  . azithromycin Stopped (08/05/2019 2213)  . cefTRIAXone (ROCEPHIN)  IV 1 g (08/01/2019 2218)     LOS: 1 day    Time spent: 35 minutes spent in the coordination of care today.    Teddy Spikeyrone A Fateh Kindle, DO [facility]  If 7PM-7AM, please contact night-coverage [email or web address] 07/26/2019, 4:18 PM

## 2019-07-26 NOTE — Progress Notes (Signed)
  Echocardiogram 2D Echocardiogram has been performed.  Celene Skeen 07/26/2019, 3:47 PM

## 2019-07-26 NOTE — Progress Notes (Signed)
No loose BM noted the whole shift.

## 2019-07-26 NOTE — Progress Notes (Signed)
Patient continues to present with periods of tachycardia with HR changing between 80's to 120's. In addition, patient remains tachypneic - patient admitted for SOB and is currently being treated for pneumonia.  Rapid response nurse notified about patient's condition today at 2311. Updated them on patient's condition and history and asked them to place patient on their list to check-in with.  Continuing to monitor patient with frequent vital sign checks. Patient not presenting with any acute distress at this time and is resting comfortably in the bed on room air.

## 2019-07-26 NOTE — Progress Notes (Signed)
Pharmacy Antibiotic Note  Charles Walters is a 59 y.o. male admitted on July 31, 2019 with intractable hiccups. Patient currently afebrile, wbc normal at 9.5, however CT shows multifocal pneumonia, will broaden CAP coverage to vancomycin and cefepime. Pharmacy to assist in dosing.  Vancomycin 1250 mg IV Q 12 hrs. Goal AUC 400-550. Expected AUC: 464 SCr used: 0.68   Plan: Cefepime 2g q8 hours Vancomycin 1250mg  IV q12 hours  Height: 6\' 1"  (185.4 cm) Weight: 141 lb 15.6 oz (64.4 kg)(Shoes + telemetry box removed at time of weighing) IBW/kg (Calculated) : 79.9  Temp (24hrs), Avg:97.9 F (36.6 C), Min:97.6 F (36.4 C), Max:98.1 F (36.7 C)  Recent Labs  Lab 07/31/2019 1304 Jul 31, 2019 1611 2019/07/31 2348 07/26/19 0508  WBC 10.9*  --  9.8 9.5  CREATININE 0.77 0.64 0.68 0.68    Estimated Creatinine Clearance: 92.8 mL/min (by C-G formula based on SCr of 0.68 mg/dL).    No Known Allergies  Thank you for allowing pharmacy to be a part of this patient's care.  07/27/19 PharmD., BCPS Clinical Pharmacist 07/26/2019 4:53 PM

## 2019-07-27 ENCOUNTER — Encounter (HOSPITAL_COMMUNITY): Payer: Self-pay | Admitting: Internal Medicine

## 2019-07-27 LAB — CBC WITH DIFFERENTIAL/PLATELET
Abs Immature Granulocytes: 0.06 10*3/uL (ref 0.00–0.07)
Basophils Absolute: 0 10*3/uL (ref 0.0–0.1)
Basophils Relative: 0 %
Eosinophils Absolute: 0 10*3/uL (ref 0.0–0.5)
Eosinophils Relative: 0 %
HCT: 26.9 % — ABNORMAL LOW (ref 39.0–52.0)
Hemoglobin: 9.9 g/dL — ABNORMAL LOW (ref 13.0–17.0)
Immature Granulocytes: 1 %
Lymphocytes Relative: 9 %
Lymphs Abs: 0.7 10*3/uL (ref 0.7–4.0)
MCH: 32.6 pg (ref 26.0–34.0)
MCHC: 36.8 g/dL — ABNORMAL HIGH (ref 30.0–36.0)
MCV: 88.5 fL (ref 80.0–100.0)
Monocytes Absolute: 0.8 10*3/uL (ref 0.1–1.0)
Monocytes Relative: 10 %
Neutro Abs: 6.4 10*3/uL (ref 1.7–7.7)
Neutrophils Relative %: 80 %
Platelets: 179 10*3/uL (ref 150–400)
RBC: 3.04 MIL/uL — ABNORMAL LOW (ref 4.22–5.81)
RDW: 11.5 % (ref 11.5–15.5)
WBC: 8 10*3/uL (ref 4.0–10.5)
nRBC: 0 % (ref 0.0–0.2)

## 2019-07-27 LAB — SODIUM
Sodium: 125 mmol/L — ABNORMAL LOW (ref 135–145)
Sodium: 128 mmol/L — ABNORMAL LOW (ref 135–145)
Sodium: 124 mmol/L — ABNORMAL LOW (ref 135–145)
Sodium: 127 mmol/L — ABNORMAL LOW (ref 135–145)

## 2019-07-27 LAB — COMPREHENSIVE METABOLIC PANEL
ALT: 22 U/L (ref 0–44)
AST: 29 U/L (ref 15–41)
Albumin: 1.8 g/dL — ABNORMAL LOW (ref 3.5–5.0)
Alkaline Phosphatase: 49 U/L (ref 38–126)
Anion gap: 10 (ref 5–15)
BUN: 10 mg/dL (ref 6–20)
CO2: 19 mmol/L — ABNORMAL LOW (ref 22–32)
Calcium: 8 mg/dL — ABNORMAL LOW (ref 8.9–10.3)
Chloride: 95 mmol/L — ABNORMAL LOW (ref 98–111)
Creatinine, Ser: 0.67 mg/dL (ref 0.61–1.24)
GFR calc Af Amer: >60 mL/min (ref >60)
GFR calc non Af Amer: >60 mL/min (ref >60)
Glucose, Bld: 127 mg/dL — ABNORMAL HIGH (ref 70–99)
Potassium: 3.5 mmol/L (ref 3.5–5.1)
Sodium: 124 mmol/L — ABNORMAL LOW (ref 135–145)
Total Bilirubin: 1.3 mg/dL — ABNORMAL HIGH (ref 0.3–1.2)
Total Protein: 6 g/dL — ABNORMAL LOW (ref 6.5–8.1)

## 2019-07-27 LAB — MAGNESIUM: Magnesium: 1.9 mg/dL (ref 1.7–2.4)

## 2019-07-27 NOTE — Progress Notes (Signed)
Charles Walters Kitchen  PROGRESS NOTE    Charles Walters  TZG:[id number] DOB: [birth year] DOA: 07/17/2019 PCP: Charles Sharps, NP   Brief Narrative:   [name] an 59 y.o.malewith past medical history significant for HTN who presented to [facility] ED for treatment of hiccups. Patient states that he was in his usual state of reasonable health until 1 week ago when he developed intractable hiccups as well as loss of appetite, watery diarrhea which occurs 1 time a day and some dyspnea on exertion. Patient states he rested and was treating himself conservatively however the hiccups bothered him so much that he was unable to eat. When the hiccups did not resolve he came to the ED.  Patient states that he feels generally well according to him. He does admit that he is short of breath with ambulation but is not short of breath at rest. Patient denies fevers or chills. He denies any cough or hemoptysis. He does admit to decreased appetite as well as inability to eat due to intractable hiccups. Denies any abdominal pain. No profound fatigue or malaise. Not sure if he has had any exposure to Covid.  07/27/19: Apparently his diarrhea has stopped. GI PCR is pending. RVP was negative and his COVID testing was negative. Broadened abx for multifocal pna and he looks better this AM. Na+ is stable at 125. Prior admission from a few years ago show similar Na+. He is unaware of what he baseline is. Overall he is improving. Add incentive spiro today. Continue current abx.    Assessment & Plan:   Principal Problem:   Hyponatremia Active Problems:   Intractable hiccups   Diarrhea   Asthenia   Essential hypertension  Hyponatremia     - multifactorial etiology including combination of HCTZ, losartan, diarrhea, decreased p.o. 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 L ast labs we have prior to this admission has him at about the same. He follows with [facility]. Will try to obtain sodium records. He is much more alert this AM and not displaying any neurological symptoms  Generalized weakness     - PT/OT eval when more stable     - he's COVID negative  Acute respiratory failure w/ hypoxia Multifocal PNA     - Atypical PNA vs pulmonary fibrosis on CXR     - procal mildly elevated     - COVID negative     - inflamatory markers are up, but improving     - multifocal pneumonia seen on CT     - elevated d-dimer, so ordered CTA PE, results pending     - being treated with CAP coverage, will broaden abx to vanc, cefepime w/ pharm to dose.      - 07/27/19: CTA negative for clot. Resp status improving with abx. Add incentive spiro. 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     - Echocardiogram: Left Ventricle: Left ventricular ejection fraction, by  estimation, is 60 to 65%. The left ventricle has normal function. The left ventricle has no regional wall motion abnormalities. The left ventricular internal cavity size was normal in size. There is no left ventricular hypertrophy. Left ventricular diastolic parameters are consistent with Grade I diastolic dysfunction (impaired relaxation). Right Ventricle: The right ventricular size is normal. No increase in right ventricular wall thickness. Right ventricular systolic function is  low normal. There is moderately elevated pulmonary artery systolic pressure. The tricuspid regurgitant velocity is 3.08 m/s, and with an assumed right atrial pressure of 3 mmHg, the  estimated right ventricular systolic pressure is 40.9 mmHg.  Diarrhea     - Patient states he has been having  diarrhea for a week, however states is only 1 time a day liquid stool     - c diff ordered, GI PCR ordered     - 07/27/19: No loose stools noted ON; lab still not collected  HTN     - continue norvasc     - hold HCTZ, losartan d/t hyponatremia     - 07/27/19: BP ok, continue current Tx.   Hiccups     - not complaining of hiccups this morning, none witness during time in room     - 07/27/19: denies complaint this morning  DVT prophylaxis: lovenox Code Status: FULL Family Communication: Asked pt, he said there is no point in calling his family in [place].   Disposition Plan: Remains inpt for fluids, IV abx  Consultants:   Nephrology  Antimicrobials:  . Cefepime, vanc   ROS:  Reports improved breathing. Denies CP, N, V, ab pain . Remainder 10-pt ROS is negative for all not previously mentioned.  Subjective: "I think you covered everything."  Objective: Vitals:   07/27/19 0000 07/27/19 0312 07/27/19 0400 07/27/19 0750  BP: 99/68 115/81    Pulse: (!) 102 (!) 102 94   Resp: (!) 23 (!) 24 (!) 22   Temp: 98.6 F (37 C) (!) 97.4 F (36.3 C)  98.7 F (37.1 C)  TempSrc: Oral Oral  Oral  SpO2: 94% 97% 98%   Weight:      Height:        Intake/Output Summary (Last 24 hours) at 07/27/2019 1122 Last data filed at 07/27/2019 0400 Gross per 24 hour  Intake 2243.33 ml  Output 450 ml  Net 1793.33 ml   Filed Weights   02/18/2020 1319 07/26/19 0000  Weight: 79.4 kg 64.4 kg    Examination:  General: 59 y.o. male resting in bed in NAD Cardiovascular: RRR, +S1, S2, no m/g/r Respiratory: decreased at bases, normal WOB GI: BS+, NDNT, no masses noted, soft MSK: No e/c/c Neuro: A&O x 3, no focal deficits Psyc: Appropriate interaction and affect, calm/cooperative   Data Reviewed: I have personally reviewed following labs and imaging studies.  CBC: Recent Labs  Lab 02/18/2020 1304 02/18/2020 2348 07/26/19 0508 07/27/19 0826  WBC 10.9* 9.8 9.5 8.0  NEUTROABS  --   --   --  6.4  HGB  12.3* 11.7* 12.1* 9.9*  HCT 33.1* 31.1* 32.3* 26.9*  MCV 86.4 86.9 85.7 88.5  PLT 235 217 198 179   Basic Metabolic Panel: Recent Labs  Lab 02/18/2020 1304 02/18/2020 1304 02/18/2020 1611 02/18/2020 1800 02/18/2020 2348 02/18/2020 2349 07/26/19 0508 07/26/19 0651 07/26/19 1329 07/26/19 1757 07/26/19 2156 07/27/19 0139 07/27/19 0826  NA 114*   < > 114*   < >  --    < > 120*   < > 121*  121* 122* 125* 124*  K 3.0*  --  3.4*  --   --   --  3.9  --   --   --   --   --  3.5  CL 80*  --  79*  --   --   --  89*  --   --   --   --   --  95*  CO2 17*  --  20*  --   --   --  16*  --   --   --   --   --  19*  GLUCOSE 126*  --  107*  --   --   --  98  --   --   --   --   --  127*  BUN 7  --  7  --   --   --  10  --   --   --   --   --  10  CREATININE 0.77  --  0.64  --  0.68  --  0.68  --   --   --   --   --  0.67  CALCIUM 8.5*  --  8.6*  --   --   --  8.4*  --   --   --   --   --  8.0*  MG  --   --   --   --   --   --   --   --   --   --   --   --  1.9  PHOS  --   --   --   --   --   --  3.1  --   --   --   --   --   --    < > = values in this interval not displayed.   GFR: Estimated Creatinine Clearance: 92.8 mL/min (by C-G formula based on SCr of 0.67 mg/dL). Liver Function Tests: Recent Labs  Lab 07/26/19 0508 07/27/19 0826  AST 31 29  ALT 22 22  ALKPHOS 59 49  BILITOT 1.8* 1.3*  PROT 7.1 6.0*  ALBUMIN 2.2* 1.8*   No results for input(s): LIPASE, AMYLASE in the last 168 hours. No results for input(s): AMMONIA in the last 168 hours. Coagulation Profile: No results for input(s): INR, PROTIME in the last 168 hours. Cardiac Enzymes: No results for input(s): CKTOTAL, CKMB, CKMBINDEX, TROPONINI in the last 168 hours. BNP (last 3 results) No results for input(s): PROBNP in the last 8760 hours. HbA1C: No results for input(s): HGBA1C in the last 72 hours. CBG: No results for input(s): GLUCAP in the last 168 hours. Lipid Profile: No results for input(s): CHOL, HDL, LDLCALC, TRIG,  CHOLHDL, LDLDIRECT in the last 72 hours. Thyroid Function Tests: Recent Labs    08/04/2019 2348  TSH 1.402   Anemia Panel: Recent Labs    08/03/2019 1900  FERRITIN 1,286*   Sepsis Labs: Recent Labs  Lab 07/23/2019 1900  PROCALCITON 0.53    Recent Results (from the past 240 hour(s))  SARS CORONAVIRUS 2 (TAT 6-24 HRS) Nasopharyngeal Nasopharyngeal Swab     Status: None   Collection Time: 07/20/2019  2:32 PM   Specimen: Nasopharyngeal Swab  Result Value Ref Range Status   SARS Coronavirus 2 NEGATIVE NEGATIVE Final    Comment: (NOTE) SARS-CoV-2 target nucleic acids are NOT DETECTED. The SARS-CoV-2 RNA is generally detectable in upper and lower respiratory specimens during the acute phase  of infection. Negative results do not preclude SARS-CoV-2 infection, do not rule out co-infections with other pathogens, and should not be used as the sole basis for treatment or other patient management decisions. Negative results must be combined with clinical observations, patient history, and epidemiological information. The expected result is Negative. Fact Sheet for Patients: SugarRoll.be Fact Sheet for Healthcare Providers: https://www.woods-mathews.com/ This test is not yet approved or cleared by the [place] FDA and  has been authorized for detection and/or diagnosis of SARS-CoV-2 by FDA under an Emergency Use Authorization (EUA). This EUA will remain  in effect (meaning this test can be used) for the duration of the COVID-19 declaration under Section 56 4(b)(1) of the Act, 21 U.S.C. section 360bbb-3(b)(1), unless the authorization is terminated or revoked sooner. Performed at [facility], [place] [address] [place], [place] [address]   MRSA PCR Screening     Status: None   Collection Time: 07/26/19 12:05 AM   Specimen: Nasal Mucosa; Nasopharyngeal  Result Value Ref Range Status   MRSA by PCR NEGATIVE NEGATIVE Final    Comment:         The GeneXpert MRSA Assay (FDA approved for NASAL specimens only), is one component of a comprehensive MRSA colonization surveillance program. It is not intended to diagnose MRSA infection nor to guide or monitor treatment for MRSA infections. Performed at [facility], [place] [address], [place], [place] [address]   Respiratory Panel by PCR     Status: None   Collection Time: 07/26/19  2:12 PM   Specimen: Nasopharyngeal Swab; Respiratory  Result Value Ref Range Status   Adenovirus NOT DETECTED NOT DETECTED Final   Coronavirus 229E NOT DETECTED NOT DETECTED Final    Comment: (NOTE) The Coronavirus on the Respiratory Panel, DOES NOT test for the novel  Coronavirus (2019 nCoV)    Coronavirus HKU1 NOT DETECTED NOT DETECTED Final   Coronavirus NL63 NOT DETECTED NOT DETECTED Final   Coronavirus OC43 NOT DETECTED NOT DETECTED Final   Metapneumovirus NOT DETECTED NOT DETECTED Final   Rhinovirus / Enterovirus NOT DETECTED NOT DETECTED Final   Influenza A NOT DETECTED NOT DETECTED Final   Influenza B NOT DETECTED NOT DETECTED Final   Parainfluenza Virus 1 NOT DETECTED NOT DETECTED Final   Parainfluenza Virus 2 NOT DETECTED NOT DETECTED Final   Parainfluenza Virus 3 NOT DETECTED NOT DETECTED Final   Parainfluenza Virus 4 NOT DETECTED NOT DETECTED Final   Respiratory Syncytial Virus NOT DETECTED NOT DETECTED Final   Bordetella pertussis NOT DETECTED NOT DETECTED Final   Chlamydophila pneumoniae NOT DETECTED NOT DETECTED Final   Mycoplasma pneumoniae NOT DETECTED NOT DETECTED Final    Comment: Performed at [facility], [place]. [address], [place], [place] [address]      Radiology Studies: CT CHEST WO CONTRAST  Result Date: 08/02/2019 CLINICAL DATA:  59 year old male with shortness of breath. EXAM: CT CHEST WITHOUT CONTRAST TECHNIQUE: Multidetector CT imaging of the chest was performed following the standard protocol without IV contrast. COMPARISON:  Chest radiograph dated  08/04/2019. FINDINGS: Evaluation of this exam is limited in the absence of intravenous contrast. Cardiovascular: There is no cardiomegaly or pericardial effusion. Advanced 3 vessel coronary vascular calcification noted. There is hypoattenuation of the cardiac blood pool suggestive of a degree of anemia. Clinical correlation is recommended. There is mild aneurysmal dilatation of the ascending aorta measuring up to 4.2 cm in diameter. The central pulmonary arteries are grossly unremarkable on this noncontrast CT. Mediastinum/Nodes:  There is no hilar or mediastinal adenopathy. The esophagus and the thyroid gland are grossly unremarkable. No mediastinal fluid collection. Lungs/Pleura: There is diffuse interstitial and interlobular septal thickening and ground-glass airspace opacity of the lung bases. Scattered areas of subpleural ground-glass density also noted. Findings concerning for an atypical infection. Clinical correlation is recommended. There is no pleural effusion or pneumothorax. The central airways are patent. Upper Abdomen: Fatty infiltration of the liver. Musculoskeletal: Degenerative changes of the spine. No acute osseous pathology. IMPRESSION: 1. Findings concerning for multifocal pneumonia, possibly viral or atypical in etiology. Clinical correlation and follow-up recommended. 2. 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 Recommend annual imaging followup by CTA or MRA. This recommendation follows 2010 ACCF/AHA/AATS/ACR/ASA/SCA/SCAI/SIR/STS/SVM Guidelines for the Diagnosis and Management of Patients with Thoracic Aortic Disease. Circulation. 2010; 121: N829-F621: E266-e369. Aortic aneurysm NOS (ICD10-I71.9) 3. Three vessel coronary vascular calcification. 4. Fatty liver. Electronically Signed   By: Elgie CollardArash  Radparvar M.D.   On: 23-Jun-2019 21:10   CT ANGIO CHEST PE W OR WO CONTRAST  Result Date: 07/26/2019 CLINICAL DATA:  59 year old male with shortness of breath. Concern  for pulmonary embolism. EXAM: CT ANGIOGRAPHY CHEST WITH CONTRAST TECHNIQUE: Multidetector CT imaging of the chest was performed using the standard protocol during bolus administration of intravenous contrast. Multiplanar CT image reconstructions and MIPs were obtained to evaluate the vascular anatomy. CONTRAST:  80mL OMNIPAQUE IOHEXOL 350 MG/ML SOLN COMPARISON:  Chest CT dated 23-Jun-2019. FINDINGS: Cardiovascular: Mild dilatation of the right atrium with slight retrograde flow of contrast into the IVC suggestive of a degree of right heart dysfunction. There is 3 vessel coronary vascular calcification. No pericardial effusion. Mildly dilated ascending aorta measuring up to 4.2 cm in diameter. There is mild atherosclerotic calcification of the thoracic aorta. No dissection. Evaluation of the pulmonary arteries is somewhat limited due to respiratory motion artifact. No pulmonary artery embolus identified. Mediastinum/Nodes: There is no hilar or mediastinal adenopathy. The esophagus is grossly unremarkable. No mediastinal fluid collection. Lungs/Pleura: Bilateral lower lobe predominant as well as upper lobe subpleural ground-glass opacities with areas of interlobular septal thickening at the lung bases. This is similar to the prior CT and nonspecific but most likely represent a multifocal pneumonia, possibly atypical in etiology. Pulmonary edema or other etiologies including pulmonary alveolar proteinosis are considered less likely. Clinical correlation is recommended. There is no pleural effusion or pneumothorax. The central airways are patent. Upper Abdomen: Fatty infiltration of the liver. Musculoskeletal: Degenerative changes of the spine. No acute osseous pathology. Review of the MIP images confirms the above findings. IMPRESSION: 1. No CT evidence of pulmonary embolism. 2. Pulmonary opacities similar to prior CT, nonspecific but most likely multifocal pneumonia of atypical etiology. Clinical correlation is  recommended. 3. Mild dilatation of the ascending aorta measuring up to 4.2 cm in diameter. Follow-up as per recommendation of yesterday's CT. 4. 3 vessel coronary vascular calcification and Aortic Atherosclerosis (ICD10-I70.0). 5. Fatty liver. Electronically Signed   By: Elgie CollardArash  Radparvar M.D.   On: 07/26/2019 17:13   DG Chest Portable 1 View  Result Date: 10-03-19 CLINICAL DATA:  Shortness of breath and chest pain EXAM: PORTABLE CHEST 1 VIEW COMPARISON:  May 04, 2015 FINDINGS: There is diffuse interstitial thickening in the lung bases. The lungs elsewhere are clear. Heart size and pulmonary vascularity are normal. No adenopathy. No bone lesions. IMPRESSION: Diffuse interstitial thickening in the lung bases. Question atypical organism pneumonitis in the lung bases versus fibrosis. Both entities may be present  concurrently. Lungs otherwise clear. Cardiac silhouette within normal limits. No adenopathy. Electronically Signed   By: Bretta Bang III M.D.   On: 07/18/2019 13:57   ECHOCARDIOGRAM COMPLETE  Result Date: 07/26/2019    ECHOCARDIOGRAM REPORT   Patient Name:   BOBBY BARTON Date of Exam: 07/26/2019 Medical Rec #:  [id number]   Height:       73.0 in Accession #:    [id number]  Weight:       142.0 lb Date of Birth:  06-Oct-1960   BSA:          1.86 m Patient Age:    58 years    BP:           126/89 mmHg Patient Gender: M           HR:           108 bpm. Exam Location:  Inpatient Procedure: 2D Echo Indications:    dyspnea 786.09  History:        Patient has no prior history of Echocardiogram examinations.                 Risk Factors:Hypertension and Current Smoker.  Sonographer:    Celene Skeen RDCS (AE) Referring Phys: [id number] [facility] TUBLU CHATTERJEE  Sonographer Comments: Suboptimal parasternal window. IMPRESSIONS  1. Left ventricular ejection fraction, by estimation, is 60 to 65%. The left ventricle has normal function. The left ventricle has no regional wall motion abnormalities. Left  ventricular diastolic parameters are consistent with Grade I diastolic dysfunction (impaired relaxation).  2. Right ventricular systolic function is low normal. The right ventricular size is normal. There is moderately elevated pulmonary artery systolic pressure. The estimated right ventricular systolic pressure is 40.9 mmHg.  3. The mitral valve is normal in structure and function. No evidence of mitral valve regurgitation. No evidence of mitral stenosis.  4. Tricuspid valve regurgitation is mild to moderate.  5. The aortic valve is tricuspid. Aortic valve regurgitation is not visualized. No aortic stenosis is present.  6. Aortic dilatation noted. There is mild dilatation of the ascending aorta measuring 43 mm.  7. The inferior vena cava is normal in size with greater than 50% respiratory variability, suggesting right atrial pressure of 3 mmHg. FINDINGS  Left Ventricle: Left ventricular ejection fraction, by estimation, is 60 to 65%. The left ventricle has normal function. The left ventricle has no regional wall motion abnormalities. The left ventricular internal cavity size was normal in size. There is  no left ventricular hypertrophy. Left ventricular diastolic parameters are consistent with Grade I diastolic dysfunction (impaired relaxation). Right Ventricle: The right ventricular size is normal. No increase in right ventricular wall thickness. Right ventricular systolic function is low normal. There is moderately elevated pulmonary artery systolic pressure. The tricuspid regurgitant velocity  is 3.08 m/s, and with an assumed right atrial pressure of 3 mmHg, the estimated right ventricular systolic pressure is 40.9 mmHg. Left Atrium: Left atrial size was normal in size. Right Atrium: Right atrial size was normal in size. Pericardium: Trivial pericardial effusion is present. Mitral Valve: The mitral valve is normal in structure and function. Mild mitral annular calcification. No evidence of mitral valve  regurgitation. No evidence of mitral valve stenosis. Tricuspid Valve: The tricuspid valve is normal in structure. Tricuspid valve regurgitation is mild to moderate. Aortic Valve: The aortic valve is tricuspid. Aortic valve regurgitation is not visualized. No aortic stenosis is present. Pulmonic Valve: The pulmonic valve was not well visualized. Pulmonic valve regurgitation is  not visualized. Aorta: Aortic dilatation noted. There is mild dilatation of the ascending aorta measuring 43 mm. Venous: The inferior vena cava is normal in size with greater than 50% respiratory variability, suggesting right atrial pressure of 3 mmHg. IAS/Shunts: No atrial level shunt detected by color flow Doppler.  LEFT VENTRICLE PLAX 2D LVIDd:         4.00 cm Diastology LVIDs:         2.60 cm LV e' lateral:   11.20 cm/s LV PW:         1.10 cm LV E/e' lateral: 6.2 LV IVS:        1.00 cm LV e' medial:    6.09 cm/s LV SV Index:   25.13   LV E/e' medial:  11.4  LEFT ATRIUM         Index LA diam:    2.70 cm 1.45 cm/m  AORTIC VALVE LVOT Vmax:   75.10 cm/s LVOT Vmean:  61.100 cm/s LVOT VTI:    0.156 m  AORTA Ao Root diam: 3.10 cm MITRAL VALVE               TRICUSPID VALVE MV Area (PHT): 2.50 cm    TR Peak grad:   37.9 mmHg MV Decel Time: 304 msec    TR Vmax:        308.00 cm/s MV E velocity: 69.40 cm/s MV A velocity: 76.70 cm/s  SHUNTS MV E/A ratio:  0.90        Systemic VTI: 0.16 m Marca Ancona MD Electronically signed by Marca Ancona MD Signature Date/Time: 07/26/2019/3:56:46 PM    Final      Scheduled Meds: . amLODipine  10 mg Oral Daily  . enoxaparin (LOVENOX) injection  40 mg Subcutaneous QHS  . feeding supplement (ENSURE ENLIVE)  237 mL Oral TID BM  . mouth rinse  15 mL Mouth Rinse BID  . multivitamin with minerals  1 tablet Oral Daily  . pneumococcal 23 valent vaccine  0.5 mL Intramuscular Tomorrow-1000  . sodium chloride flush  3 mL Intravenous Once   Continuous Infusions: . sodium chloride 50 mL/hr at 07/27/19 0355  .  ceFEPime (MAXIPIME) IV 2 g (07/27/19 0528)  . vancomycin 1,250 mg (07/27/19 0642)     LOS: 2 days    Time spent: 25 minutes spent in the coordination of care today.    Teddy Spike, DO [facility]  If 7PM-7AM, please contact night-coverage [email or web address] 07/27/2019, 11:22 AM

## 2019-07-27 NOTE — TOC Progression Note (Signed)
Transition of Care [facility]) - Progression Note    Patient Details  Name: Charles Walters MRN: [id number] Date of Birth: 01-04-1961  [facility] CM/SW Contact  Charles Haven, RN Phone Number: 07/27/2019, 2:09 PM  Clinical Narrative:    NCM spoke with patient, he goes to the [facility], Charles Banter NP is his PCP at the [facility].  NCM spoke with Charles Banter NP, she states the patient has her phone number and he will need to call her when he is discharged so that she can get his medications for him, so he will need a print out of scripts .        Expected Discharge Plan and Services                                                 Social Determinants of Health (SDOH) Interventions    Readmission Risk Interventions No flowsheet data found.

## 2019-07-28 LAB — COMPREHENSIVE METABOLIC PANEL
ALT: 24 U/L (ref 0–44)
AST: 32 U/L (ref 15–41)
Albumin: 1.8 g/dL — ABNORMAL LOW (ref 3.5–5.0)
Alkaline Phosphatase: 52 U/L (ref 38–126)
Anion gap: 10 (ref 5–15)
BUN: 9 mg/dL (ref 6–20)
CO2: 20 mmol/L — ABNORMAL LOW (ref 22–32)
Calcium: 8.1 mg/dL — ABNORMAL LOW (ref 8.9–10.3)
Chloride: 96 mmol/L — ABNORMAL LOW (ref 98–111)
Creatinine, Ser: 0.64 mg/dL (ref 0.61–1.24)
GFR calc Af Amer: >60 mL/min (ref >60)
GFR calc non Af Amer: >60 mL/min (ref >60)
Glucose, Bld: 108 mg/dL — ABNORMAL HIGH (ref 70–99)
Potassium: 3.6 mmol/L (ref 3.5–5.1)
Sodium: 126 mmol/L — ABNORMAL LOW (ref 135–145)
Total Bilirubin: 0.7 mg/dL (ref 0.3–1.2)
Total Protein: 5.9 g/dL — ABNORMAL LOW (ref 6.5–8.1)

## 2019-07-28 LAB — CBC WITH DIFFERENTIAL/PLATELET
Abs Immature Granulocytes: 0.05 10*3/uL (ref 0.00–0.07)
Basophils Absolute: 0 10*3/uL (ref 0.0–0.1)
Basophils Relative: 0 %
Eosinophils Absolute: 0.1 10*3/uL (ref 0.0–0.5)
Eosinophils Relative: 1 %
HCT: 24.9 % — ABNORMAL LOW (ref 39.0–52.0)
Hemoglobin: 9 g/dL — ABNORMAL LOW (ref 13.0–17.0)
Immature Granulocytes: 1 %
Lymphocytes Relative: 11 %
Lymphs Abs: 0.9 10*3/uL (ref 0.7–4.0)
MCH: 32 pg (ref 26.0–34.0)
MCHC: 36.1 g/dL — ABNORMAL HIGH (ref 30.0–36.0)
MCV: 88.6 fL (ref 80.0–100.0)
Monocytes Absolute: 0.9 10*3/uL (ref 0.1–1.0)
Monocytes Relative: 10 %
Neutro Abs: 6.6 10*3/uL (ref 1.7–7.7)
Neutrophils Relative %: 77 %
Platelets: 158 10*3/uL (ref 150–400)
RBC: 2.81 MIL/uL — ABNORMAL LOW (ref 4.22–5.81)
RDW: 11.8 % (ref 11.5–15.5)
WBC: 8.6 10*3/uL (ref 4.0–10.5)
nRBC: 0 % (ref 0.0–0.2)

## 2019-07-28 LAB — SODIUM
Sodium: 128 mmol/L — ABNORMAL LOW (ref 135–145)
Sodium: 126 mmol/L — ABNORMAL LOW (ref 135–145)
Sodium: 127 mmol/L — ABNORMAL LOW (ref 135–145)
Sodium: 127 mmol/L — ABNORMAL LOW (ref 135–145)
Sodium: 127 mmol/L — ABNORMAL LOW (ref 135–145)
Sodium: 128 mmol/L — ABNORMAL LOW (ref 135–145)

## 2019-07-28 LAB — MAGNESIUM: Magnesium: 1.8 mg/dL (ref 1.7–2.4)

## 2019-07-28 MED ORDER — LEVOFLOXACIN 750 MG PO TABS
750.0000 mg | ORAL_TABLET | Freq: Every day | ORAL | Status: AC
  Administered 2019-07-28 – 2019-08-01 (×5): 750 mg via ORAL
  Filled 2019-07-28 (×5): qty 1

## 2019-07-28 NOTE — Plan of Care (Signed)

## 2019-07-28 NOTE — Progress Notes (Addendum)
Charles Walters  PROGRESS NOTE    Charles Walters  TTS:[id number] DOB: 18-Jun-1960 DOA: 07/22/2019 PCP: [name], NP   Brief Narrative:   Charles Walters an 59 y.o.malewith past medical history significant for HTN who presented to [facility] ED for treatment of hiccups. Patient states that he was in his usual state of reasonable health until 1 week ago when he developed intractable hiccups as well as loss of appetite, watery diarrhea which occurs 1 time a day and some dyspnea on exertion. Patient states he rested and was treating himself conservatively however the hiccups bothered him so much that he was unable to eat. When the hiccups did not resolve he came to the ED.  Patient states that he feels generally well according to him. He does admit that he is short of breath with ambulation but is not short of breath at rest. Patient denies fevers or chills. He denies any cough or hemoptysis. He does admit to decreased appetite as well as inability to eat due to intractable hiccups. Denies any abdominal pain. No profound fatigue or malaise. Not sure if he has had any exposure to Covid.  07/28/19: Na+ improved this AM. He's chronically low and should not use HCTZ any more. BP is acceptable on amlodipine. He reports several falls over the last month. Will get PT/OT to see him. Will switch to lvq today to finish out a total of 7 days of abx.    Assessment & Plan:   Principal Problem:   Hyponatremia Active Problems:   Intractable hiccups   Diarrhea   Asthenia   Essential hypertension  Hyponatremia - multifactorial etiology including combination of HCTZ, losartan, diarrhea, decreased p.o. 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 L ast labs we have prior to this admission has him  at about the same. He follows with [facility]. Will try to obtain sodium records. He is much more alert this AM and not displaying any neurological symptoms     - 07/28/19: Na+ improving. Continue fluids.  Generalized weakness - PT/OT eval when more stable - he's COVID negative  Acute respiratory failure w/ hypoxia Multifocal PNA - Atypical PNA vs pulmonary fibrosis on CXR - procal mildly elevated - COVID negative - inflamatory markers are up, but improving - multifocal pneumonia seen on CT - elevated d-dimer, so ordered CTA PE, results pending - being treated with CAP coverage, will broaden abx to vanc, cefepime w/ pharm to dose.     - 07/27/19: CTA negative for clot. Resp status improving with abx. Add incentive spiro. 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 - Echocardiogram: Left Ventricle: Left ventricular ejection fraction, by estimation, is 60 to 65%. The left ventricle has normal function. The left ventricle has no regional wall motion abnormalities. The left ventricular internal cavity size was normal in size. There is no left ventricular hypertrophy. Left ventricular diastolic parameters are consistent with Grade I diastolic dysfunction (impaired relaxation). Right Ventricle: The  right ventricular size is normal. No increase in right ventricular wall thickness. Right ventricular systolic function is  low normal. There is moderately elevated pulmonary artery systolic pressure. The tricuspid regurgitant velocity is 3.08 m/s, and with an assumed right atrial pressure of 3 mmHg, the  estimated right ventricular systolic pressure is 40.9 mmHg.  Diarrhea - Patient states he has been  having diarrhea for a week, however states is only 1 time a day liquid stool - c diff ordered, GI PCR ordered     - 07/27/19: No loose stools noted ON; lab still not collected     - 07/28/19: resolved  HTN - continue norvasc - hold HCTZ, losartan d/t hyponatremia     - 07/27/19: BP ok, continue current Tx.      - 07/28/19: BP is ok on amlodipine; he should never take HCTZ again d/t hyponatremia  Hiccups - not complaining of hiccups this morning, none witness during time in room     - 07/27/19: denies complaint this morning  Falls     - reports multiple falls over the last month     - PT/OT to see.  DVT prophylaxis: lovenox Code Status: FULL Family Communication: None at bedside   Disposition Plan: Remains inpt for IVF.  Consultants:   Nephrology  Antimicrobials:  . Lvq   ROS:  Denies CP, N, ab pain, dyspnea, HA, D . Remainder 10-pt ROS is negative for all not previously mentioned.  Subjective: "I may have fell a few times."  Objective: Vitals:   07/27/19 2341 07/28/19 0344 07/28/19 0735 07/28/19 1113  BP: 114/83 94/64 117/81 126/83  Pulse: 96 99 91 98  Resp: (!) 23 19 (!) 24 18  Temp: 97.6 F (36.4 C) 98.3 F (36.8 C) 99 F (37.2 C) 98.3 F (36.8 C)  TempSrc: Oral Oral Oral Oral  SpO2: 98% 99% 99% 95%  Weight:      Height:        Intake/Output Summary (Last 24 hours) at 07/28/2019 1124 Last data filed at 07/28/2019 0800 Gross per 24 hour  Intake 1880 ml  Output 700 ml  Net 1180 ml   Filed Weights   07/23/2019 1319 07/26/19 0000  Weight: 79.4 kg 64.4 kg    Examination:  General: 59 y.o. male resting in bed in NAD Cardiovascular: RRR, +S1, S2, no m/g/r, equal pulses throughout Respiratory: decreased at bases but improving, normal WOB GI: BS+, NDNT, no masses noted, no organomegaly noted MSK: No e/c/c Neuro: A&O x 3, no focal deficits Psyc: Appropriate interaction and affect, calm/cooperative   Data Reviewed: I have personally  reviewed following labs and imaging studies.  CBC: Recent Labs  Lab 08/04/2019 1304 07/27/2019 2348 07/26/19 0508 07/27/19 0826 07/28/19 0616  WBC 10.9* 9.8 9.5 8.0 8.6  NEUTROABS  --   --   --  6.4 6.6  HGB 12.3* 11.7* 12.1* 9.9* 9.0*  HCT 33.1* 31.1* 32.3* 26.9* 24.9*  MCV 86.4 86.9 85.7 88.5 88.6  PLT 235 217 198 179 158   Basic Metabolic Panel: Recent Labs  Lab 07/17/2019 1304 07/27/2019 1304 07/11/2019 1611 07/09/2019 1800 07/20/2019 2348 07/21/2019 2349 07/26/19 0508 07/26/19 0651 07/27/19 0826 07/27/19 1241 07/27/19 2048 07/28/19 0058 07/28/19 0254 07/28/19 0616 07/28/19 0953  NA 114*   < > 114*   < >  --    < > 120*   < > 124*   < > 127* 128* 126* 126* 127*  K 3.0*  --  3.4*  --   --   --  3.9  --  3.5  --   --   --   --  3.6  --   CL 80*  --  79*  --   --   --  89*  --  95*  --   --   --   --  96*  --   CO2 17*  --  20*  --   --   --  16*  --  19*  --   --   --   --  20*  --   GLUCOSE 126*  --  107*  --   --   --  98  --  127*  --   --   --   --  108*  --   BUN 7  --  7  --   --   --  10  --  10  --   --   --   --  9  --   CREATININE 0.77   < > 0.64  --  0.68  --  0.68  --  0.67  --   --   --   --  0.64  --   CALCIUM 8.5*  --  8.6*  --   --   --  8.4*  --  8.0*  --   --   --   --  8.1*  --   MG  --   --   --   --   --   --   --   --  1.9  --   --   --   --  1.8  --   PHOS  --   --   --   --   --   --  3.1  --   --   --   --   --   --   --   --    < > = values in this interval not displayed.   GFR: Estimated Creatinine Clearance: 92.8 mL/min (by C-G formula based on SCr of 0.64 mg/dL). Liver Function Tests: Recent Labs  Lab 07/26/19 0508 07/27/19 0826 07/28/19 0616  AST 31 29 32  ALT 22 22 24   ALKPHOS 59 49 52  BILITOT 1.8* 1.3* 0.7  PROT 7.1 6.0* 5.9*  ALBUMIN 2.2* 1.8* 1.8*   No results for input(s): LIPASE, AMYLASE in the last 168 hours. No results for input(s): AMMONIA in the last 168 hours. Coagulation Profile: No results for input(s): INR, PROTIME in the  last 168 hours. Cardiac Enzymes: No results for input(s): CKTOTAL, CKMB, CKMBINDEX, TROPONINI in the last 168 hours. BNP (last 3 results) No results for input(s): PROBNP in the last 8760 hours. HbA1C: No results for input(s): HGBA1C in the last 72 hours. CBG: No results for input(s): GLUCAP in the last 168 hours. Lipid Profile: No results for input(s): CHOL, HDL, LDLCALC, TRIG, CHOLHDL, LDLDIRECT in the last 72 hours. Thyroid Function Tests: Recent Labs    08/05/2019 2348  TSH 1.402   Anemia Panel: Recent Labs    07/09/2019 1900  FERRITIN 1,286*   Sepsis Labs: Recent Labs  Lab 07/15/2019 1900  PROCALCITON 0.53    Recent Results (from the past 240 hour(s))  SARS CORONAVIRUS 2 (TAT 6-24 HRS) Nasopharyngeal Nasopharyngeal Swab     Status: None   Collection Time: 08/04/2019  2:32 PM   Specimen: Nasopharyngeal Swab  Result Value Ref Range Status   SARS Coronavirus 2 NEGATIVE NEGATIVE Final  Comment: (NOTE) SARS-CoV-2 target nucleic acids are NOT DETECTED. The SARS-CoV-2 RNA is generally detectable in upper and lower respiratory specimens during the acute phase of infection. Negative results do not preclude SARS-CoV-2 infection, do not rule out co-infections with other pathogens, and should not be used as the sole basis for treatment or other patient management decisions. Negative results must be combined with clinical observations, patient history, and epidemiological information. The expected result is Negative. Fact Sheet for Patients: HairSlick.no Fact Sheet for Healthcare Providers: quierodirigir.com This test is not yet approved or cleared by the [place] FDA and  has been authorized for detection and/or diagnosis of SARS-CoV-2 by FDA under an Emergency Use Authorization (EUA). This EUA will remain  in effect (meaning this test can be used) for the duration of the COVID-19 declaration under Section 56 4(b)(1) of  the Act, 21 U.S.C. section 360bbb-3(b)(1), unless the authorization is terminated or revoked sooner. Performed at [facility], [address] [place], [place] [address]   MRSA PCR Screening     Status: None   Collection Time: 07/26/19 12:05 AM   Specimen: Nasal Mucosa; Nasopharyngeal  Result Value Ref Range Status   MRSA by PCR NEGATIVE NEGATIVE Final    Comment:        The GeneXpert MRSA Assay (FDA approved for NASAL specimens only), is one component of a comprehensive MRSA colonization surveillance program. It is not intended to diagnose MRSA infection nor to guide or monitor treatment for MRSA infections. Performed at [facility], [address] [place], [place] [address]   Respiratory Panel by PCR     Status: None   Collection Time: 07/26/19  2:12 PM   Specimen: Nasopharyngeal Swab; Respiratory  Result Value Ref Range Status   Adenovirus NOT DETECTED NOT DETECTED Final   Coronavirus 229E NOT DETECTED NOT DETECTED Final    Comment: (NOTE) The Coronavirus on the Respiratory Panel, DOES NOT test for the novel  Coronavirus (2019 nCoV)    Coronavirus HKU1 NOT DETECTED NOT DETECTED Final   Coronavirus NL63 NOT DETECTED NOT DETECTED Final   Coronavirus OC43 NOT DETECTED NOT DETECTED Final   Metapneumovirus NOT DETECTED NOT DETECTED Final   Rhinovirus / Enterovirus NOT DETECTED NOT DETECTED Final   Influenza A NOT DETECTED NOT DETECTED Final   Influenza B NOT DETECTED NOT DETECTED Final   Parainfluenza Virus 1 NOT DETECTED NOT DETECTED Final   Parainfluenza Virus 2 NOT DETECTED NOT DETECTED Final   Parainfluenza Virus 3 NOT DETECTED NOT DETECTED Final   Parainfluenza Virus 4 NOT DETECTED NOT DETECTED Final   Respiratory Syncytial Virus NOT DETECTED NOT DETECTED Final   Bordetella pertussis NOT DETECTED NOT DETECTED Final   Chlamydophila pneumoniae NOT DETECTED NOT DETECTED Final   Mycoplasma pneumoniae NOT DETECTED NOT DETECTED Final    Comment: Performed  at [facility], [address] [place], [place] [address]      Radiology Studies: CT ANGIO CHEST PE W OR WO CONTRAST  Result Date: 07/26/2019 CLINICAL DATA:  59 year old male with shortness of breath. Concern for pulmonary embolism. EXAM: CT ANGIOGRAPHY CHEST WITH CONTRAST TECHNIQUE: Multidetector CT imaging of the chest was performed using the standard protocol during bolus administration of intravenous contrast. Multiplanar CT image reconstructions and MIPs were obtained to evaluate the vascular anatomy. CONTRAST:  64mL OMNIPAQUE IOHEXOL 350 MG/ML SOLN COMPARISON:  Chest CT dated 07/26/2019. FINDINGS: Cardiovascular: Mild dilatation of the right atrium with slight retrograde flow of contrast into the  IVC suggestive of a degree of right heart dysfunction. There is 3 vessel coronary vascular calcification. No pericardial effusion. Mildly dilated ascending aorta measuring up to 4.2 cm in diameter. There is mild atherosclerotic calcification of the thoracic aorta. No dissection. Evaluation of the pulmonary arteries is somewhat limited due to respiratory motion artifact. No pulmonary artery embolus identified. Mediastinum/Nodes: There is no hilar or mediastinal adenopathy. The esophagus is grossly unremarkable. No mediastinal fluid collection. Lungs/Pleura: Bilateral lower lobe predominant as well as upper lobe subpleural ground-glass opacities with areas of interlobular septal thickening at the lung bases. This is similar to the prior CT and nonspecific but most likely represent a multifocal pneumonia, possibly atypical in etiology. Pulmonary edema or other etiologies including pulmonary alveolar proteinosis are considered less likely. Clinical correlation is recommended. There is no pleural effusion or pneumothorax. The central airways are patent. Upper Abdomen: Fatty infiltration of the liver. Musculoskeletal: Degenerative changes of the spine. No acute osseous pathology. Review of the MIP images  confirms the above findings. IMPRESSION: 1. No CT evidence of pulmonary embolism. 2. Pulmonary opacities similar to prior CT, nonspecific but most likely multifocal pneumonia of atypical etiology. Clinical correlation is recommended. 3. Mild dilatation of the ascending aorta measuring up to 4.2 cm in diameter. Follow-up as per recommendation of yesterday's CT. 4. 3 vessel coronary vascular calcification and Aortic Atherosclerosis (ICD10-I70.0). 5. Fatty liver. Electronically Signed   By: Elgie Collard M.D.   On: 07/26/2019 17:13   ECHOCARDIOGRAM COMPLETE  Result Date: 07/26/2019    ECHOCARDIOGRAM REPORT   Patient Name:   MATTIAS WALMSLEY Date of Exam: 07/26/2019 Medical Rec #:  [id number]   Height:       73.0 in Accession #:    [id number]  Weight:       142.0 lb Date of Birth:  [birth year]   BSA:          1.86 m Patient Age:    58 years    BP:           126/89 mmHg Patient Gender: M           HR:           108 bpm. Exam Location:  Inpatient Procedure: 2D Echo Indications:    dyspnea 786.09  History:        Patient has no prior history of Echocardiogram examinations.                 Risk Factors:Hypertension and Current Smoker.  Sonographer:    Celene Skeen RDCS (AE) Referring Phys: [id number] [facility] TUBLU CHATTERJEE  Sonographer Comments: Suboptimal parasternal window. IMPRESSIONS  1. Left ventricular ejection fraction, by estimation, is 60 to 65%. The left ventricle has normal function. The left ventricle has no regional wall motion abnormalities. Left ventricular diastolic parameters are consistent with Grade I diastolic dysfunction (impaired relaxation).  2. Right ventricular systolic function is low normal. The right ventricular size is normal. There is moderately elevated pulmonary artery systolic pressure. The estimated right ventricular systolic pressure is 40.9 mmHg.  3. The mitral valve is normal in structure and function. No evidence of mitral valve regurgitation. No evidence of mitral stenosis.  4.  Tricuspid valve regurgitation is mild to moderate.  5. The aortic valve is tricuspid. Aortic valve regurgitation is not visualized. No aortic stenosis is present.  6. Aortic dilatation noted. There is mild dilatation of the ascending aorta measuring 43 mm.  7. The inferior vena cava is normal in size with greater than  50% respiratory variability, suggesting right atrial pressure of 3 mmHg. FINDINGS  Left Ventricle: Left ventricular ejection fraction, by estimation, is 60 to 65%. The left ventricle has normal function. The left ventricle has no regional wall motion abnormalities. The left ventricular internal cavity size was normal in size. There is  no left ventricular hypertrophy. Left ventricular diastolic parameters are consistent with Grade I diastolic dysfunction (impaired relaxation). Right Ventricle: The right ventricular size is normal. No increase in right ventricular wall thickness. Right ventricular systolic function is low normal. There is moderately elevated pulmonary artery systolic pressure. The tricuspid regurgitant velocity  is 3.08 m/s, and with an assumed right atrial pressure of 3 mmHg, the estimated right ventricular systolic pressure is 40.9 mmHg. Left Atrium: Left atrial size was normal in size. Right Atrium: Right atrial size was normal in size. Pericardium: Trivial pericardial effusion is present. Mitral Valve: The mitral valve is normal in structure and function. Mild mitral annular calcification. No evidence of mitral valve regurgitation. No evidence of mitral valve stenosis. Tricuspid Valve: The tricuspid valve is normal in structure. Tricuspid valve regurgitation is mild to moderate. Aortic Valve: The aortic valve is tricuspid. Aortic valve regurgitation is not visualized. No aortic stenosis is present. Pulmonic Valve: The pulmonic valve was not well visualized. Pulmonic valve regurgitation is not visualized. Aorta: Aortic dilatation noted. There is mild dilatation of the ascending aorta  measuring 43 mm. Venous: The inferior vena cava is normal in size with greater than 50% respiratory variability, suggesting right atrial pressure of 3 mmHg. IAS/Shunts: No atrial level shunt detected by color flow Doppler.  LEFT VENTRICLE PLAX 2D LVIDd:         4.00 cm Diastology LVIDs:         2.60 cm LV e' lateral:   11.20 cm/s LV PW:         1.10 cm LV E/e' lateral: 6.2 LV IVS:        1.00 cm LV e' medial:    6.09 cm/s LV SV Index:   25.13   LV E/e' medial:  11.4  LEFT ATRIUM         Index LA diam:    2.70 cm 1.45 cm/m  AORTIC VALVE LVOT Vmax:   75.10 cm/s LVOT Vmean:  61.100 cm/s LVOT VTI:    0.156 m  AORTA Ao Root diam: 3.10 cm MITRAL VALVE               TRICUSPID VALVE MV Area (PHT): 2.50 cm    TR Peak grad:   37.9 mmHg MV Decel Time: 304 msec    TR Vmax:        308.00 cm/s MV E velocity: 69.40 cm/s MV A velocity: 76.70 cm/s  SHUNTS MV E/A ratio:  0.90        Systemic VTI: 0.16 m Marca Ancona MD Electronically signed by Marca Ancona MD Signature Date/Time: 07/26/2019/3:56:46 PM    Final      Scheduled Meds: . amLODipine  10 mg Oral Daily  . enoxaparin (LOVENOX) injection  40 mg Subcutaneous QHS  . feeding supplement (ENSURE ENLIVE)  237 mL Oral TID BM  . levofloxacin  750 mg Oral Daily  . mouth rinse  15 mL Mouth Rinse BID  . multivitamin with minerals  1 tablet Oral Daily  . sodium chloride flush  3 mL Intravenous Once   Continuous Infusions: . sodium chloride 50 mL/hr at 07/28/19 0000     LOS: 3 days    Time  spent: 25 minutes spent in the coordination of care today.    Teddy Spikeyrone A Audryna Wendt, DO [facility]  If 7PM-7AM, please contact night-coverage [email or web address] 07/28/2019, 11:24 AM

## 2019-07-29 LAB — SODIUM
Sodium: 126 mmol/L — ABNORMAL LOW (ref 135–145)
Sodium: 126 mmol/L — ABNORMAL LOW (ref 135–145)

## 2019-07-29 LAB — CBC WITH DIFFERENTIAL/PLATELET
Abs Immature Granulocytes: 0.05 10*3/uL (ref 0.00–0.07)
Basophils Absolute: 0 10*3/uL (ref 0.0–0.1)
Basophils Relative: 0 %
Eosinophils Absolute: 0.1 10*3/uL (ref 0.0–0.5)
Eosinophils Relative: 1 %
HCT: 24.1 % — ABNORMAL LOW (ref 39.0–52.0)
Hemoglobin: 8.8 g/dL — ABNORMAL LOW (ref 13.0–17.0)
Immature Granulocytes: 1 %
Lymphocytes Relative: 8 %
Lymphs Abs: 0.8 10*3/uL (ref 0.7–4.0)
MCH: 32.2 pg (ref 26.0–34.0)
MCHC: 36.5 g/dL — ABNORMAL HIGH (ref 30.0–36.0)
MCV: 88.3 fL (ref 80.0–100.0)
Monocytes Absolute: 1 10*3/uL (ref 0.1–1.0)
Monocytes Relative: 10 %
Neutro Abs: 7.9 10*3/uL — ABNORMAL HIGH (ref 1.7–7.7)
Neutrophils Relative %: 80 %
Platelets: 175 10*3/uL (ref 150–400)
RBC: 2.73 MIL/uL — ABNORMAL LOW (ref 4.22–5.81)
RDW: 11.8 % (ref 11.5–15.5)
WBC: 9.8 10*3/uL (ref 4.0–10.5)
nRBC: 0 % (ref 0.0–0.2)

## 2019-07-29 LAB — COMPREHENSIVE METABOLIC PANEL
ALT: 33 U/L (ref 0–44)
AST: 40 U/L (ref 15–41)
Albumin: 1.6 g/dL — ABNORMAL LOW (ref 3.5–5.0)
Alkaline Phosphatase: 54 U/L (ref 38–126)
Anion gap: 6 (ref 5–15)
BUN: 6 mg/dL (ref 6–20)
CO2: 21 mmol/L — ABNORMAL LOW (ref 22–32)
Calcium: 7.9 mg/dL — ABNORMAL LOW (ref 8.9–10.3)
Chloride: 96 mmol/L — ABNORMAL LOW (ref 98–111)
Creatinine, Ser: 0.55 mg/dL — ABNORMAL LOW (ref 0.61–1.24)
GFR calc Af Amer: >60 mL/min (ref >60)
GFR calc non Af Amer: >60 mL/min (ref >60)
Glucose, Bld: 99 mg/dL (ref 70–99)
Potassium: 3.6 mmol/L (ref 3.5–5.1)
Sodium: 123 mmol/L — ABNORMAL LOW (ref 135–145)
Total Bilirubin: 0.4 mg/dL (ref 0.3–1.2)
Total Protein: 5.5 g/dL — ABNORMAL LOW (ref 6.5–8.1)

## 2019-07-29 LAB — MAGNESIUM: Magnesium: 1.6 mg/dL — ABNORMAL LOW (ref 1.7–2.4)

## 2019-07-29 MED ORDER — AMLODIPINE BESYLATE 10 MG PO TABS: 10.0000 mg | ORAL_TABLET | Freq: Every day | ORAL | 1 refills | Status: AC

## 2019-07-29 MED ORDER — LEVOFLOXACIN 750 MG PO TABS: 750.0000 mg | ORAL_TABLET | Freq: Every day | ORAL | 0 refills | Status: DC

## 2019-07-29 NOTE — Progress Notes (Addendum)
Pt ambulated in a hallway and he desats upto 80's, but in room in resting position he is okay, only while ambulating he have a SOB, and ST. Other than that denies pain. IV fluid continue @50 ,will continue to monitor the patient  , RN

## 2019-07-29 NOTE — Progress Notes (Signed)
[name]  PROGRESS NOTE    Lige Lakeman  XHB:[id number] DOB: [birth year] DOA: 08-13-19 PCP: [name], NP   Brief Narrative:   Charles Walters an 59 y.o.malewith past medical history significant for HTN who presented to [facility] ED for treatment of hiccups. Patient states that he was in his usual state of reasonable health until 1 week ago when he developed intractable hiccups as well as loss of appetite, watery diarrhea which occurs 1 time a day and some dyspnea on exertion. Patient states he rested and was treating himself conservatively however the hiccups bothered him so much that he was unable to eat. When the hiccups did not resolve he came to the ED.  Patient states that he feels generally well according to him. He does admit that he is short of breath with ambulation but is not short of breath at rest. Patient denies fevers or chills. He denies any cough or hemoptysis. He does admit to decreased appetite as well as inability to eat due to intractable hiccups. Denies any abdominal pain. No profound fatigue or malaise. Not sure if he has had any exposure to Covid.  07/29/19:Na+is stable. He has no neurologic dysfunction at this time and states he feels back a baseline. He should never use HCTZ for BP control. At this time, he BP is good on amlodipine. Will need 3 more days of lvq after today. He was evaluated by PT. Per nursing, her was tachypneic, tachycardic and desatted into the mid-80's with ambulation. He is now back on 2L [place] when re-interviewed this afternoon.    Assessment & Plan:   Principal Problem:   Hyponatremia Active Problems:   Intractable hiccups   Diarrhea   Asthenia   Essential hypertension  Hyponatremia - multifactorial etiology including combination of HCTZ, losartan, diarrhea, decreased p.o. 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 L ast labs we have prior to this admission has him at about the same. He follows with [facility]. Will try to obtain sodium records. He is much more alert this AM and not displaying any neurological symptoms - 07/28/19: Na+ improving. Continue fluids.     - 07/29/19: Na+ is stable. He has no neurological deficits at this time. He is likely chronically low. He needs to avoid HCTZ. Have counseled him on that.  Acute respiratory failure w/ hypoxia Multifocal PNA - Atypical PNA vs pulmonary fibrosis on CXR - procal mildly elevated - COVID negative - inflamatory markers are up, but improving - multifocal pneumonia seen on CT - elevated d-dimer, so ordered CTA PE, results pending - being treated with CAP coverage, will broaden abx to vanc, cefepime w/ pharm to dose. - 07/27/19: CTA negative for clot. Resp status improving with abx. Add incentive spiro. Doing well on RA duringinterview - 07/28/19: change to lvq 750mg  to complete 7 total days of abx coverage     - 07/29/19: Day 4 of abx coverage. Needs 3 more days at discharge. 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 -  Echocardiogram: Left Ventricle: Left ventricular ejection fraction, by estimation, is 60 to 65%. The left ventricle has normal function. The left ventricle has no regional wall motion abnormalities. The left ventricular internal cavity size was normal in size. There is no left ventricular hypertrophy. Left ventricular diastolic parameters are consistent with Grade I diastolic dysfunction (impaired relaxation). Right Ventricle: The right  ventricular size is normal. No increase in right ventricular wall thickness. Right ventricular systolic function is  low normal. There is moderately elevated pulmonary artery systolic pressure. The tricuspid regurgitant velocity is 3.08 m/s, and with an assumed right atrial pressure of 3 mmHg, the  estimated right ventricular systolic pressure is 20.9 mmHg.  Diarrhea - Patient states he has been having diarrhea for a week, however states is only 1 time a day liquid stool - c diff ordered, GI PCR ordered - 07/27/19: No loose stools noted ON; lab still not collected - 07/28/19: resolved  HTN - continue norvasc - hold HCTZ, losartan d/t hyponatremia - 07/27/19: BP ok, continue current Tx. - 07/28/19: BP is ok on amlodipine; he should never take HCTZ again d/t hyponatremia      - 07/28/19: Amlodipine is doing well for him. Doesn't need losartan right now. He should avoid HCTZ.      - 07/29/19: BP acceptable.  Hiccups - not complaining of hiccups this morning, none witness during time in room - 07/27/19: denies complaint this morning  Falls Generalized weakness - reports multiple falls over the last month - PT/OT to see.     - 07/29/19: during eval w/ PT; per nursing, pt became tachypneic, tachycardic, and desatted to the mid-80's. He is now back on 2L  during second interview of the day; not ready for d/c just yet.   DVT prophylaxis: lovenox Code Status: FULL Family Communication: None at bedside   Disposition Plan: Remains inpt for O2 support  Consultants:   Nephrology  Antimicrobials:  . levaquin 750mg     ROS:  Denies CP, N, V, ab pain . Remainder 10-pt ROS is negative for all not previously mentioned.  Subjective: "I really thought I was gonna get to go today."  Objective: Vitals:   07/29/19 0731 07/29/19 1054 07/29/19 1100 07/29/19 1200  BP: 138/89 114/69  121/74  Pulse: 98 (!) 108 98 99  Resp: 20 (!) 27 (!) 22 20    Temp: 98.4 F (36.9 C) 98 F (36.7 C)  98 F (36.7 C)  TempSrc: Oral Oral  Oral  SpO2: 98% 97% 100% 96%  Weight:      Height:        Intake/Output Summary (Last 24 hours) at 07/29/2019 1336 Last data filed at 07/29/2019 1200 Gross per 24 hour  Intake 1200 ml  Output 1225 ml  Net -25 ml   Filed Weights   08/05/2019 1319 07/26/19 0000  Weight: 79.4 kg 64.4 kg    Examination:  General: 59 y.o. male resting in bed in NAD Cardiovascular: RRR, +S1, S2, no m/g/r Respiratory: Clear, no w/r/r, normal WOB GI: BS+, NDNT, soft MSK: No e/c/c Neuro: A&O x 3, no focal deficits Psyc: Appropriate interaction and affect, calm/cooperative   Data Reviewed: I have personally reviewed following labs and imaging studies.  CBC: Recent Labs  Lab 07/24/2019 2348 07/26/19 0508 07/27/19 0826 07/28/19 0616 07/29/19 0625  WBC 9.8 9.5 8.0 8.6 9.8  NEUTROABS  --   --  6.4 6.6 7.9*  HGB 11.7* 12.1* 9.9* 9.0* 8.8*  HCT 31.1* 32.3* 26.9* 24.9* 24.1*  MCV 86.9 85.7 88.5 88.6 88.3  PLT 217 198 179 158 175   Basic Metabolic Panel: Recent Labs  Lab 07/29/2019 1611 07/26/2019 1800 07/16/2019 2348 07/18/2019 2349 07/26/19 0508 07/26/19 0651 07/27/19 0826 07/27/19 1241 07/28/19 0616 07/28/19 0953 07/28/19 1350 07/28/19 1744 07/28/19 2207 07/29/19 0201 07/29/19 0625  NA 114*   < >  --    < > 120*   < > 124*   < > 126*   < > 127* 127* 128* 126* 123*  126*  K 3.4*  --   --   --  3.9  --  3.5  --  3.6  --   --   --   --   --  3.6  CL 79*  --   --   --  89*  --  95*  --  96*  --   --   --   --   --  96*  CO2 20*  --   --   --  16*  --  19*  --  20*  --   --   --   --   --  21*  GLUCOSE 107*  --   --   --  98  --  127*  --  108*  --   --   --   --   --  99  BUN 7  --   --   --  10  --  10  --  9  --   --   --   --   --  6  CREATININE 0.64   < > 0.68  --  0.68  --  0.67  --  0.64  --   --   --   --   --  0.55*  CALCIUM 8.6*  --   --   --  8.4*  --  8.0*  --  8.1*  --   --   --   --   --  7.9*  MG  --    --   --   --   --   --  1.9  --  1.8  --   --   --   --   --  1.6*  PHOS  --   --   --   --  3.1  --   --   --   --   --   --   --   --   --   --    < > = values in this interval not displayed.   GFR: Estimated Creatinine Clearance: 92.8 mL/min (A) (by C-G formula based on SCr of 0.55 mg/dL (L)). Liver Function Tests: Recent Labs  Lab 07/26/19 0508 07/27/19 0826 07/28/19 0616 07/29/19 0625  AST 31 29 32 40  ALT 22 22 24  33  ALKPHOS 59 49 52 54  BILITOT 1.8* 1.3* 0.7 0.4  PROT 7.1 6.0* 5.9* 5.5*  ALBUMIN 2.2* 1.8* 1.8* 1.6*   No results for input(s): LIPASE, AMYLASE in the last 168 hours. No results for input(s): AMMONIA in the last 168 hours. Coagulation Profile: No results for input(s): INR, PROTIME in the last 168 hours. Cardiac Enzymes: No results for input(s): CKTOTAL, CKMB, CKMBINDEX, TROPONINI in the last 168 hours. BNP (last 3 results) No results for input(s): PROBNP in the last 8760 hours. HbA1C: No results for input(s): HGBA1C in the last 72 hours. CBG: No results for input(s): GLUCAP in the last 168  hours. Lipid Profile: No results for input(s): CHOL, HDL, LDLCALC, TRIG, CHOLHDL, LDLDIRECT in the last 72 hours. Thyroid Function Tests: No results for input(s): TSH, T4TOTAL, FREET4, T3FREE, THYROIDAB in the last 72 hours. Anemia Panel: No results for input(s): VITAMINB12, FOLATE, FERRITIN, TIBC, IRON, RETICCTPCT in the last 72 hours. Sepsis Labs: Recent Labs  Lab 07/11/2019 1900  PROCALCITON 0.53    Recent Results (from the past 240 hour(s))  SARS CORONAVIRUS 2 (TAT 6-24 HRS) Nasopharyngeal Nasopharyngeal Swab     Status: None   Collection Time: 08/01/2019  2:32 PM   Specimen: Nasopharyngeal Swab  Result Value Ref Range Status   SARS Coronavirus 2 NEGATIVE NEGATIVE Final    Comment: (NOTE) SARS-CoV-2 target nucleic acids are NOT DETECTED. The SARS-CoV-2 RNA is generally detectable in upper and lower respiratory specimens during the acute phase of infection.  Negative results do not preclude SARS-CoV-2 infection, do not rule out co-infections with other pathogens, and should not be used as the sole basis for treatment or other patient management decisions. Negative results must be combined with clinical observations, patient history, and epidemiological information. The expected result is Negative. Fact Sheet for Patients: HairSlick.no Fact Sheet for Healthcare Providers: quierodirigir.com This test is not yet approved or cleared by the [place] FDA and  has been authorized for detection and/or diagnosis of SARS-CoV-2 by FDA under an Emergency Use Authorization (EUA). This EUA will remain  in effect (meaning this test can be used) for the duration of the COVID-19 declaration under Section 56 4(b)(1) of the Act, 21 U.S.C. section 360bbb-3(b)(1), unless the authorization is terminated or revoked sooner. Performed at [facility], [address] [place], [place] [address]   MRSA PCR Screening     Status: None   Collection Time: 07/26/19 12:05 AM   Specimen: Nasal Mucosa; Nasopharyngeal  Result Value Ref Range Status   MRSA by PCR NEGATIVE NEGATIVE Final    Comment:        The GeneXpert MRSA Assay (FDA approved for NASAL specimens only), is one component of a comprehensive MRSA colonization surveillance program. It is not intended to diagnose MRSA infection nor to guide or monitor treatment for MRSA infections. Performed at [facility], [address], [place], [place] [address]   Respiratory Panel by PCR     Status: None   Collection Time: 07/26/19  2:12 PM   Specimen: Nasopharyngeal Swab; Respiratory  Result Value Ref Range Status   Adenovirus NOT DETECTED NOT DETECTED Final   Coronavirus 229E NOT DETECTED NOT DETECTED Final    Comment: (NOTE) The Coronavirus on the Respiratory Panel, DOES NOT test for the novel  Coronavirus (2019 nCoV)    Coronavirus  HKU1 NOT DETECTED NOT DETECTED Final   Coronavirus NL63 NOT DETECTED NOT DETECTED Final   Coronavirus OC43 NOT DETECTED NOT DETECTED Final   Metapneumovirus NOT DETECTED NOT DETECTED Final   Rhinovirus / Enterovirus NOT DETECTED NOT DETECTED Final   Influenza A NOT DETECTED NOT DETECTED Final   Influenza B NOT DETECTED NOT DETECTED Final   Parainfluenza Virus 1 NOT DETECTED NOT DETECTED Final   Parainfluenza Virus 2 NOT DETECTED NOT DETECTED Final   Parainfluenza Virus 3 NOT DETECTED NOT DETECTED Final   Parainfluenza Virus 4 NOT DETECTED NOT DETECTED Final   Respiratory Syncytial Virus NOT DETECTED NOT DETECTED Final   Bordetella pertussis NOT DETECTED NOT DETECTED Final   Chlamydophila pneumoniae NOT DETECTED NOT DETECTED Final   Mycoplasma pneumoniae NOT DETECTED NOT  DETECTED Final    Comment: Performed at [facility], [address] [place], [place] [address]      Radiology Studies: No results found.   Scheduled Meds: . amLODipine  10 mg Oral Daily  . enoxaparin (LOVENOX) injection  40 mg Subcutaneous QHS  . feeding supplement (ENSURE ENLIVE)  237 mL Oral TID BM  . levofloxacin  750 mg Oral Daily  . mouth rinse  15 mL Mouth Rinse BID  . multivitamin with minerals  1 tablet Oral Daily  . sodium chloride flush  3 mL Intravenous Once   Continuous Infusions: . sodium chloride 50 mL/hr at 07/29/19 0728     LOS: 4 days    Time spent: 35 minutes spent in the coordination of care today.    Teddy Spike, DO [facility]  If 7PM-7AM, please contact night-coverage [email or web address] 07/29/2019, 1:36 PM

## 2019-07-29 NOTE — Evaluation (Signed)
Occupational Therapy Evaluation Patient Details Name: Charles Walters MRN: [id number] DOB: 01-17-1961 Today's Date: 07/29/2019    History of Present Illness This 59 y.o. male admitted with intractable hiccups, loss of appetite, watery diarrhea, and DOE. Dx: acute respiratory failure with hypoxia, multifocal PNA, hyponatremia.  PMH includes: HTN   Clinical Impression   Pt admitted with above. He demonstrates the below listed deficits and will benefit from continued OT to maximize safety and independence with BADLs.  Pt presents to OT with significant  Activity intolerance, impaired balance, generalized weakness.  He requires min A for ADLs and can only tolerate standing for 4-5 mins at a time with sats in the low 90s on 3L supplemental 02,  and HR in the 130s DOE 3/4 -4/4 and pt very tremulous. He requires frequent seated rest breaks. He was instructed in pursed lip breathing and encouraged IS use. 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  He was fully independent and relies on public transportation which requires extensive walking.  He does report he has a friend who will either lend him his truck or provide transport to appointments, grocery store etc.  Will continue to fllow       Follow Up Recommendations  No OT follow up;Supervision - Intermittent    Equipment Recommendations  Tub/shower seat    Recommendations for Other Services       Precautions / Restrictions Precautions Precautions: Fall      Mobility Bed Mobility Overal bed mobility: Independent                Transfers Overall transfer level: Needs assistance Equipment used: None;1 person hand held assist Transfers: Sit to/from Stand;Stand Pivot Transfers Sit to Stand: Min guard Stand pivot transfers: Min guard;Min assist       General transfer comment: Pt very unsteady upon standing, bracing legs against bed for support. He  is able to perform transfer with min guard assist (close min guard) with UE support, but min A without UE support.  Bil. LEs very tremulous     Balance Overall balance assessment: Needs assistance Sitting-balance support: No upper extremity supported;Feet supported Sitting balance-Leahy Scale: Good     Standing balance support: Single extremity supported;No upper extremity supported;During functional activity Standing balance-Leahy Scale: Poor Standing balance comment: requires UE support or min A for up to 5 mins before fatiguing                             ADL either performed or assessed with clinical judgement   ADL Overall ADL's : Needs assistance/impaired Eating/Feeding: Independent   Grooming: Wash/dry hands;Wash/dry face;Oral care;Brushing hair;Min guard;Standing   Upper Body Bathing: Set up;Sitting   Lower Body Bathing: Minimal assistance;Sit to/from stand   Upper Body Dressing : Set up;Sitting   Lower Body Dressing: Minimal assistance;Sit to/from stand   Toilet Transfer: Minimal assistance;Ambulation;Comfort height toilet;Grab bars   Toileting- Clothing Manipulation and Hygiene: Min guard;Sit to/from stand       Functional mobility during ADLs: Minimal assistance General ADL Comments: Pt fatigues with activity      Vision         Perception     Praxis      Pertinent Vitals/Pain Pain Assessment: No/denies pain     Hand Dominance Right   Extremity/Trunk Assessment Upper Extremity Assessment Upper Extremity Assessment: Generalized weakness   Lower  Extremity Assessment Lower Extremity Assessment: Generalized weakness   Cervical / Trunk Assessment Cervical / Trunk Assessment: Normal   Communication Communication Communication: No difficulties   Cognition Arousal/Alertness: Awake/alert Behavior During Therapy: WFL for tasks assessed/performed Overall Cognitive Status: Within Functional Limits for tasks assessed                                      General Comments  02 sats on room air at rest 94%; upon standing sats dropped to 84% on RA with HR increasing from 112 - 134.   02 increased to 2 then 3L and pt instructed in pursed lip breathing with sats remaining 88-92% on 3L.  HR while seated 112, while standing increases to 130s.  Pt instructed to perform IS at least 10x/hour, and to perform LE and UE exercise several times/hour. Pt relies on public transportation, but states he can borrow a friend's truck to go to appointments or to the grocery, or his friend will drive him     Exercises     Shoulder Instructions      Home Living Family/patient expects to be discharged to:: Private residence Living Arrangements: Alone Available Help at Discharge: Friend(s);Available PRN/intermittently Type of Home: House Home Access: Level entry     Home Layout: One level     Bathroom Shower/Tub: Tub/shower unit;Curtain   Biochemist, clinical: Standard Bathroom Accessibility: No   Home Equipment: None   Additional Comments: Pt resides in a boarding house - private room with shared bath and shared kitchen       Prior Functioning/Environment Level of Independence: Independent        Comments: relies on public transportation, but can borrow a friend's truck. working as Clinical research associate for [employer] Problem List: Decreased strength;Decreased activity tolerance;Impaired balance (sitting and/or standing);Cardiopulmonary status limiting activity;Decreased knowledge of use of DME or AE      OT Treatment/Interventions: Self-care/ADL training;Therapeutic exercise;Energy conservation;DME and/or AE instruction;Therapeutic activities;Patient/family education;Balance training    OT Goals(Current goals can be found in the care plan section) Acute Rehab OT Goals Patient Stated Goal: get my strength back  OT Goal Formulation: With patient Time For Goal Achievement: 08/12/19 Potential to Achieve Goals: Good ADL  Goals Pt Will Perform Grooming: (P) with modified independence;standing Pt Will Perform Upper Body Bathing: (P) with modified independence;standing Pt Will Perform Lower Body Bathing: (P) with modified independence;sit to/from stand Pt Will Perform Upper Body Dressing: (P) with modified independence;sitting;standing Pt Will Perform Lower Body Dressing: (P) with modified independence;sit to/from stand Pt Will Transfer to Toilet: (P) with modified independence;ambulating;regular height toilet;grab bars Pt Will Perform Toileting - Clothing Manipulation and hygiene: (P) with modified independence;sit to/from stand Pt Will Perform Tub/Shower Transfer: (P) Tub transfer;with modified independence;ambulating;shower seat Pt/caregiver will Perform Home Exercise Program: (P) Increased strength;Right Upper extremity;Left upper extremity;With written HEP provided;With theraband;Independently Additional ADL Goal #1: (P) Pt will be independent with energy conservation techniques  OT Frequency: Min 2X/week   Barriers to D/C: Decreased caregiver support          Co-evaluation              AM-PAC OT "6 Clicks" Daily Activity     Outcome Measure Help from another person eating meals?: None Help from another person taking care of personal grooming?: A Little Help from another person toileting, which includes using toliet, bedpan, or urinal?: A Little  Help from another person bathing (including washing, rinsing, drying)?: A Little Help from another person to put on and taking off regular upper body clothing?: A Little Help from another person to put on and taking off regular lower body clothing?: A Little 6 Click Score: 19   End of Session Equipment Utilized During Treatment: Gait belt;Oxygen Nurse Communication: Mobility status  Activity Tolerance: Patient limited by fatigue Patient left: in bed;with call bell/phone within reach  OT Visit Diagnosis: Unsteadiness on feet (R26.81)                 Time: 3524-8185 OT Time Calculation (min): 36 min Charges:  OT General Charges $OT Visit: 1 Visit OT Evaluation $OT Eval Moderate Complexity: 1 Mod OT Treatments $Therapeutic Activity: 8-22 mins  Eber Jones., OTR/L Acute Rehabilitation Services Pager [phone] Office [phone]   Jeani Hawking M 07/29/2019, 6:49 PM

## 2019-07-29 NOTE — Evaluation (Signed)
Physical Therapy Evaluation Patient Details Name: Charles Walters MRN: [id number] DOB: 1960-12-25 Today's Date: 07/29/2019   History of Present Illness    59 y.o.malewith past medical history significant for HTN who presented to [facility] ED for treatment of hiccups. Patient states that he was in his usual state of reasonable health until 1 week ago when he developed intractable hiccups as well as loss of appetite, watery diarrhea which occurs 1 time a day and some dyspnea on exertion. Patient states he rested and was treating himself conservatively however the hiccups bothered him so much that he was unable to eat.    Clinical Impression  Pt presents with mild limitations to functional mobility due to LE weakness, stiffness in joints, and cardiopulmonary dysfunction resulting in decreased walking tolerance and stability.  Mild balance deficits noted in setting of cardiopulmonary compromise suggesting LE muscle weakness contributes to balance deficits partly due to poor oxygen supply. Pt walked short bouts x3 with consistent physiologic response of elevated HR (120's to 150's) and decreased SaO2 (ranges low 80s, sometimes dips to 70's) and increased WOB while on RA.  Walked similar distance with 2LO2 per [place] and maintained HR in 120's and SaO2 in upper 90's to 100%, but SaO2 dipped to 80's when O2 titrated down to 1L.   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 Recommend consider supplemental O2 for home activities, and HHPT to address weakness/deconditioning and progression to OPPT for work hardening-type activities to facilitate return to work asap.  PT will initiate care in hospital with goal to d/c once medically stable.     Follow Up Recommendations Home health PT    Equipment Recommendations  None recommended by PT    Recommendations for Other Services       Precautions / Restrictions Precautions Precautions: Fall       Mobility  Bed Mobility Overal bed mobility: Independent                Transfers Overall transfer level: Modified independent Equipment used: None             General transfer comment: up/down unassisted with standby initially for safety give leg weakness, but no need for physical assist; limited by stiffness from immobility and weakness  Ambulation/Gait Ambulation/Gait assistance: Min guard   Assistive device: None Gait Pattern/deviations: Step-through pattern(minimal knee flexion)     General Gait Details: stiff and waddling initially but worked out kinks and felt less limited by third bout.  notes limited knee flexion in swing phase and lacks pelvic rotation.    Stairs            Wheelchair Mobility    Modified Rankin (Stroke Patients Only)       Balance Overall balance assessment: Needs assistance Sitting-balance support: No upper extremity supported;Feet supported Sitting balance-Leahy Scale: Good     Standing balance support: No upper extremity supported;During functional activity Standing balance-Leahy Scale: Good           Rhomberg - Eyes Opened: 15 Rh[place]berg - Eyes Closed: 10                 Pertinent Vitals/Pain      Home Living Family/patient expects to be discharged to:: Private residence Living Arrangements: Alone Available Help at Discharge: Friend(s);Available PRN/intermittently Type of Home: Apartment(please verify, pt may live in group situation) Home Access: Level entry     Home Layout: One level Home Equipment: None  Prior Function Level of Independence: Independent         Comments: working as Clinical research associate for [employer]        Extremity/Trunk Assessment             Cervical / Trunk Assessment Cervical / Trunk Assessment: Normal  Communication   Communication: No difficulties  Cognition Arousal/Alertness: Awake/alert Behavior During Therapy: WFL for tasks  assessed/performed Overall Cognitive Status: Within Functional Limits for tasks assessed                                        General Comments      Exercises     Assessment/Plan    PT Assessment Patient needs continued PT services  PT Problem List Decreased strength;Decreased balance;Decreased activity tolerance;Decreased mobility;Cardiopulmonary status limiting activity       PT Treatment Interventions Gait training;Functional mobility training;Therapeutic activities;Therapeutic exercise;Balance training;Patient/family education    PT Goals (Current goals can be found in the Care Plan section)  Acute Rehab PT Goals Patient Stated Goal: return to work PT Goal Formulation: With patient Time For Goal Achievement: 08/12/19 Potential to Achieve Goals: Good    Frequency Min 3X/week   Barriers to discharge        Co-evaluation               AM-PAC PT "6 Clicks" Mobility  Outcome Measure Help needed turning from your back to your side while in a flat bed without using bedrails?: None Help needed moving from lying on your back to sitting on the side of a flat bed without using bedrails?: None Help needed moving to and from a bed to a chair (including a wheelchair)?: A Little Help needed standing up from a chair using your arms (e.g., wheelchair or bedside chair)?: A Little Help needed to walk in hospital room?: A Little Help needed climbing 3-5 steps with a railing? : A Little 6 Click Score: 20    End of Session Equipment Utilized During Treatment: Gait belt;Oxygen Activity Tolerance: Patient tolerated treatment well Patient left: in bed;with call bell/phone within reach Nurse Communication: Mobility status;Other (comment)(physiologic response) PT Visit Diagnosis: Difficulty in walking, not elsewhere classified (R26.2);Other (comment)(cardiopulmonary dysfunction)    Time:  -      Charges:              Kearney Hard, PT, DPT, MS Board  Certified Geriatric Clinical Specialist   Kearney Hard [facility] 07/29/2019, 2:28 PM

## 2019-07-29 NOTE — Plan of Care (Signed)

## 2019-07-29 NOTE — TOC Progression Note (Signed)
Transition of Care [facility]) - Progression Note    Patient Details  Name: Charles Walters MRN: [id number] Date of Birth: 1961/06/02  [facility] CM/SW Contact  Lawerance Sabal, RN Phone Number: 07/29/2019, 3:07 PM  Clinical Narrative:    Referral made to [employer] for charity Oxygen, please follow up with [employer] to verify the referral process inititated         Expected Discharge Plan and Services                           DME Arranged: Oxygen DME Agency: [employer] Date DME Agency Contacted: 07/29/19 Time DME Agency Contacted: 74 Representative spoke with at DME Agency: Fayrene Fearing             Social Determinants of Health (SDOH) Interventions    Readmission Risk Interventions No flowsheet data found.

## 2019-07-29 NOTE — Plan of Care (Signed)
  Problem: Education: Goal: Knowledge of General Education information will improve Description: Including pain rating scale, medication(s)/side effects and non-pharmacologic comfort measures Outcome: Completed/Met   Problem: Health Behavior/Discharge Planning: Goal: Ability to manage health-related needs will improve Outcome: Completed/Met   Problem: Clinical Measurements: Goal: Ability to maintain clinical measurements within normal limits will improve Outcome: Completed/Met Goal: Will remain free from infection Outcome: Completed/Met Goal: Diagnostic test results will improve Outcome: Completed/Met Goal: Respiratory complications will improve Outcome: Completed/Met Goal: Cardiovascular complication will be avoided Outcome: Completed/Met   Problem: Activity: Goal: Risk for activity intolerance will decrease Outcome: Completed/Met   Problem: Nutrition: Goal: Adequate nutrition will be maintained Outcome: Completed/Met   Problem: Elimination: Goal: Will not experience complications related to bowel motility Outcome: Completed/Met Goal: Will not experience complications related to urinary retention Outcome: Completed/Met   Problem: Pain Managment: Goal: General experience of comfort will improve Outcome: Completed/Met   Problem: Safety: Goal: Ability to remain free from injury will improve Outcome: Completed/Met   Problem: Skin Integrity: Goal: Risk for impaired skin integrity will decrease Outcome: Completed/Met

## 2019-07-30 ENCOUNTER — Inpatient Hospital Stay (HOSPITAL_COMMUNITY): Payer: Medicaid Other

## 2019-07-30 DIAGNOSIS — J9601 Acute respiratory failure with hypoxia: Secondary | ICD-10-CM

## 2019-07-30 MED ORDER — VANCOMYCIN HCL 1250 MG/250ML IV SOLN
1250.0000 mg | Freq: Two times a day (BID) | INTRAVENOUS | Status: DC
  Administered 2019-07-30 – 2019-08-01 (×4): 1250 mg via INTRAVENOUS
  Filled 2019-07-30 (×4): qty 250

## 2019-07-30 MED ORDER — SODIUM CHLORIDE 0.9 % IV SOLN: Freq: Once | INTRAVENOUS | Status: AC

## 2019-07-30 MED ORDER — MAGNESIUM SULFATE 2 GM/50ML IV SOLN
2.0000 g | Freq: Once | INTRAVENOUS | Status: AC
  Administered 2019-07-30: 2 g via INTRAVENOUS
  Filled 2019-07-30: qty 50

## 2019-07-30 NOTE — Progress Notes (Addendum)
Ambulation trial with oxygen and with out   With oxygen Pt was 92-94% on 2L with HR in 110 and 120.  On Room Air desaturation to 82 with HR 135 to 140.  Pt denied any Shortness of Breath.  However, when he returned to his room, he was noted to be short of breath. He was placed back on 2 L n/c during the walk, and placed on 4 L for recovery.

## 2019-07-30 NOTE — TOC Progression Note (Addendum)
Transition of Care [facility]) - Progression Note    Patient Details  Name: Burlon Centrella MRN: [id number] Date of Birth: [birth year]  Transition of [facility]) CM/SW Contact  Leone Haven, RN Phone Number: 07/30/2019, 10:18 AM  Clinical Narrative:    NCM checked with Ian Malkin with [employer] about the home oxygen for charity, he states this is being processed , they will bring oxygen to patient room and facilitate home set up.   NCM spoke with patient, he goes to the [facility] [place], [name] NP is his PCP at the [facility].  NCM spoke with [name] NP, she states the patient has her phone number and he will need to call her when he is discharged so that she can get his medications for him, so he will need a print out of scripts .        Expected Discharge Plan and Services                           DME Arranged: Oxygen DME Agency: [employer] Date DME Agency Contacted: 07/29/19 Time DME Agency Contacted: [phone] Representative spoke with at DME Agency: Fayrene Fearing             Social Determinants of Health (SDOH) Interventions    Readmission Risk Interventions No flowsheet data found.

## 2019-07-30 NOTE — Consult Note (Signed)
NAME:  Charles Walters, MRN:  [id number], DOB:  Aug 29, 1960, LOS: 5 ADMISSION DATE:  15-Aug-2019, CONSULTATION DATE:  07/30/2019 REFERRING MD:  Dr. Marylyn Ishihara, CHIEF COMPLAINT:  Acute respiratory failure   Brief History   59yo male originally presented with prolonged persistent hiccups and dyspnea. Imagining consistent with multifocal pneumonia.  History of present illness   Charles Walters is a 59yo male with past medical history of HTN who presented Aug 15, 2019 with chief complaint of persistent hiccups and associated dyspnea. Workup thus far consistent with multifocal pneumonia as well as hyponatremia. Due to elevated D-dimer CTA chest was obtained that was negative for PE and consistent with multifocal pneumonia possible due to viral vs atypical etiology. Prolonged hospital stay due to inability to liberate from supplemental oxygen.   Further history obtained revealed patient has several risk factors for the development of pneumonia including current everyday smoker of 1 pack every other day, daily alcohol consumption of 1 beer per day, and very poor oral dentition including multiple cavities with several teeth rotten to the gum line. However his imagine or exam isn't consistent with significant emphysema. Patient denies any excessive drinking to the point of passing out, with no signs of alcohol withdrawal during admission. He denies any recent weight loss, dysphagia, night sweats, lower extremity edema, or orthopnea.  PCCM consulted for further assistance/reccomendations   Past Medical History  HTN   Significant Hospital Events   Admitted 15-Aug-2019  Consults:    Procedures:    Significant Diagnostic Tests:  CT chest 2/17 >  1. Findings concerning for multifocal pneumonia, possibly viral or atypical in etiology. Clinical correlation and follow-up recommended. 2. 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 Recommend annual imaging followup by CTA or  MRA. This recommendation follows 2010 ACCF/AHA/AATS/ACR/ASA/SCA/SCAI/SIR/STS/SVM Guidelines for the Diagnosis and Management of Patients with Thoracic Aortic Disease. Circulation. 2010; 121: B510-C585. Aortic aneurysm NOS (ICD10-I71.9) 3. Three vessel coronary vascular calcification. 4. Fatty liver  CTA chest 2/18 > 1. No CT evidence of pulmonary embolism. 2. Pulmonary opacities similar to prior CT, nonspecific but most likely multifocal pneumonia of atypical etiology. Clinical correlation is recommended. 3. Mild dilatation of the ascending aorta measuring up to 4.2 cm in diameter. Follow-up as per recommendation of yesterday's CT. 4. 3 vessel coronary vascular calcification and Aortic Atherosclerosis (ICD10-I70.0). 5. Fatty liver.  CXR 07/30/2019 > Increased RIGHT basilar infiltrate with additionally increased atelectasis versus infiltrate at LEFT base.  Micro Data:  COVID 2/17 >  Negative  MRSA PCR 2/18 > Negative  RVP 2/18 > Negative  Respiratory culture pending   Antimicrobials:  Azithromycin 2/17 > 2/18  Cefepime 2/18 > 2/20 Ceftriaxone 2/17 > 2/18 Vancomycin 2/18 >  Levofloxacin 2/20 >  Interim history/subjective:  Lying in bed in no acute distress, oxygen saturations high 90's on 2L [place]. RN reports decompensations with ambulation, she reported tachycardia and desaturation to mid 80's on room air.   Objective   Blood pressure 120/84, pulse (!) 132, temperature 98 F (36.7 C), temperature source Oral, resp. rate (!) 27, height 6\' 1"  (1.854 m), weight 64.4 kg, SpO2 98 %.        Intake/Output Summary (Last 24 hours) at 07/30/2019 1124 Last data filed at 07/30/2019 0400 Gross per 24 hour  Intake 2105.97 ml  Output 1200 ml  Net 905.97 ml   Filed Weights   2019-08-15 1319 07/26/19 0000  Weight: 79.4 kg 64.4 kg    Examination: General: Thin slightly deconditioned elderly  male lying in bed, in NAD HEENT: [place]/AT, MM pink/moist, PERRL,  Neuro: Alert and oriented x3,  non-focal CV: s1s2 regular rate and rhythm, no murmur, rubs, or gallops,  PULM:  Clear to ascultation bilaterally, faint crackles to right lower lobe, successful observation of use of IS during assessment.  GI: soft, bowel sounds active in all 4 quadrants, non-tender, non-distended Extremities: warm/dry, no edema  Skin: no rashes or lesions  Resolved Hospital Problem list     Assessment & Plan:  59yo male presented with persistent hiccups and dyspnea, imagine consistent with multifocal pneumonia with progressive worsening imaging and continued need for supplemental oxygen   Acute Hypoxic Respiratory Failure  High suspicion for hypersensitivity pneumonitis  -CT imagine and presentation suspicious for acute hypersensitivity pneumonitis  P: Continue supplemental oxygen to maintain sats greater than 92% Encourage frequent pulmonary hygiene  Mobilize as able  Continue antibiotics  Obtain hypersensitivity pneumonitis lab, ANA, RF, and CCP Coordinating for supplemental oxygen need at discharge Patient will need to follow up with pulmonologist at discharge with repeat Chest CT (likely HRCT) in 6-8weeks - apt has been made May need bronch in the future    Best practice:  Diet: Regular  Pain/Anxiety/Delirium protocol (if indicated): PRNs VAP protocol (if indicated): N/A DVT prophylaxis: Lovenox  GI prophylaxis: PPI Glucose control: SSI Mobility: Up with assistance  Code Status: Full Family Communication: Per primary  Disposition: Per primary   Labs   CBC: Recent Labs  Lab 08/04/2019 2348 07/26/19 0508 07/27/19 0826 07/28/19 0616 07/29/19 0625  WBC 9.8 9.5 8.0 8.6 9.8  NEUTROABS  --   --  6.4 6.6 7.9*  HGB 11.7* 12.1* 9.9* 9.0* 8.8*  HCT 31.1* 32.3* 26.9* 24.9* 24.1*  MCV 86.9 85.7 88.5 88.6 88.3  PLT 217 198 179 158 175    Basic Metabolic Panel: Recent Labs  Lab 07/29/2019 1611 07/29/2019 1800 07/20/2019 2348 07/30/2019 2349 07/26/19 0508 07/26/19 0651 07/27/19 0826  07/27/19 1241 07/28/19 0616 07/28/19 0953 07/28/19 1350 07/28/19 1744 07/28/19 2207 07/29/19 0201 07/29/19 0625  NA 114*   < >  --    < > 120*   < > 124*   < > 126*   < > 127* 127* 128* 126* 123*  126*  K 3.4*  --   --   --  3.9  --  3.5  --  3.6  --   --   --   --   --  3.6  CL 79*  --   --   --  89*  --  95*  --  96*  --   --   --   --   --  96*  CO2 20*  --   --   --  16*  --  19*  --  20*  --   --   --   --   --  21*  GLUCOSE 107*  --   --   --  98  --  127*  --  108*  --   --   --   --   --  99  BUN 7  --   --   --  10  --  10  --  9  --   --   --   --   --  6  CREATININE 0.64   < > 0.68  --  0.68  --  0.67  --  0.64  --   --   --   --   --  0.55*  CALCIUM 8.6*  --   --   --  8.4*  --  8.0*  --  8.1*  --   --   --   --   --  7.9*  MG  --   --   --   --   --   --  1.9  --  1.8  --   --   --   --   --  1.6*  PHOS  --   --   --   --  3.1  --   --   --   --   --   --   --   --   --   --    < > = values in this interval not displayed.   GFR: Estimated Creatinine Clearance: 92.8 mL/min (A) (by C-G formula based on SCr of 0.55 mg/dL (L)). Recent Labs  Lab 07/30/2019 1900 07/09/2019 2348 07/26/19 0508 07/27/19 0826 07/28/19 0616 07/29/19 0625  PROCALCITON 0.53  --   --   --   --   --   WBC  --    < > 9.5 8.0 8.6 9.8   < > = values in this interval not displayed.    Liver Function Tests: Recent Labs  Lab 07/26/19 0508 07/27/19 0826 07/28/19 0616 07/29/19 0625  AST 31 29 32 40  ALT 22 22 24  33  ALKPHOS 59 49 52 54  BILITOT 1.8* 1.3* 0.7 0.4  PROT 7.1 6.0* 5.9* 5.5*  ALBUMIN 2.2* 1.8* 1.8* 1.6*   No results for input(s): LIPASE, AMYLASE in the last 168 hours. No results for input(s): AMMONIA in the last 168 hours.  ABG No results found for: PHART, PCO2ART, PO2ART, HCO3, TCO2, ACIDBASEDEF, O2SAT   Coagulation Profile: No results for input(s): INR, PROTIME in the last 168 hours.  Cardiac Enzymes: No results for input(s): CKTOTAL, CKMB, CKMBINDEX, TROPONINI in the last  168 hours.  HbA1C: No results found for: HGBA1C  CBG: No results for input(s): GLUCAP in the last 168 hours.  Review of Systems: Positives in bold   Gen: Denies fever, chills, weight change, fatigue, night sweats HEENT: Denies blurred vision, double vision, hearing loss, tinnitus, sinus congestion, rhinorrhea, sore throat, neck stiffness, dysphagia PULM: Denies shortness of breath, cough, sputum production, hemoptysis, wheezing, Hiccups  CV: Denies chest pain, edema, orthopnea, paroxysmal nocturnal dyspnea, palpitations GI: Denies abdominal pain, nausea, vomiting, diarrhea, hematochezia, melena, constipation, change in bowel habits GU: Denies dysuria, hematuria, polyuria, oliguria, urethral discharge Endocrine: Denies hot or cold intolerance, polyuria, polyphagia or appetite change Derm: Denies rash, dry skin, scaling or peeling skin change Heme: Denies easy bruising, bleeding, bleeding gums Neuro: Denies headache, numbness, weakness, slurred speech, loss of memory or consciousness   Past Medical History  He,  has a past medical history of Hypertension.   Surgical History   History reviewed. No pertinent surgical history.   Social History   reports that he has been smoking. He has never used smokeless tobacco. He reports current alcohol use. He reports that he does not use drugs.   Family History   His family history is not on file.   Allergies No Known Allergies   Home Medications  Prior to Admission medications   Medication Sig Start Date End Date Taking? Authorizing Provider  hydrochlorothiazide (HYDRODIURIL) 25 MG tablet Take 25 mg by mouth daily. 06/07/19  Yes [provider]  losartan (COZAAR) 25 MG tablet Take 25 mg by mouth daily. 04/12/19  Yes [provider]  sildenafil (REVATIO) 20 MG tablet Take 40 mg by mouth daily as needed (for E.D.).  04/12/19  Yes [provider]  amLODipine (NORVASC) 10 MG tablet Take 1 tablet (10 mg total) by mouth  daily. 07/29/19 09/27/19  Margie Ege A, DO  amLODipine (NORVASC) 5 MG tablet Take 1 tablet (5 mg total) by mouth daily. Patient not taking: Reported on 2019/08/04 11/24/16   Deatra Canter, FNP  levofloxacin (LEVAQUIN) 750 MG tablet Take 1 tablet (750 mg total) by mouth daily for 3 days. 07/30/19 08/02/19  Margie Ege A, DO  methylPREDNISolone (MEDROL DOSEPAK) 4 MG TBPK tablet 6-5-4-3-2-1 po qd Patient not taking: Reported on 2019/08/04 11/24/16   Deatra Canter, FNP     Signature:  Delfin Gant, NP-C [facility] Contact / Pager information can be found on Amion  07/30/2019, 12:08 PM

## 2019-07-30 NOTE — Progress Notes (Signed)
Pharmacy Antibiotic Note  Charles Walters is a 59 y.o. male admitted on August 14, 2019 with intractable hiccups found to have multi-focal pna treated with vancomycin and cefepime.  MRSA swab negative- vanc d/c 2/19 and cefipime change to levofloxacin.  Today spike fever 100.6, increase work of breathing and tachycardiac.  Plan to check blood cx and restart vancomycin  Plan Continue levofloxacin  Restart Vancomycin 1250 mg IV Q 12 hrs. Goal AUC 400-550. Expected AUC: 464 SCr used: 0.6   Plan: Cefepime 2g q8 hours Vancomycin 1250mg  IV q12 hours  Height: 6\' 1"  (185.4 cm) Weight: 141 lb 15.6 oz (64.4 kg)(Shoes + telemetry box removed at time of weighing) IBW/kg (Calculated) : 79.9  Temp (24hrs), Avg:98.9 F (37.2 C), Min:98 F (36.7 C), Max:100.6 F (38.1 C)  Recent Labs  Lab 2019-08-14 2348 07/26/19 0508 07/27/19 0826 07/28/19 0616 07/29/19 0625  WBC 9.8 9.5 8.0 8.6 9.8  CREATININE 0.68 0.68 0.67 0.64 0.55*    Estimated Creatinine Clearance: 92.8 mL/min (A) (by C-G formula based on SCr of 0.55 mg/dL (L)).    No Known Allergies   07/30/19 Pharm.D. CPP, BCPS Clinical Pharmacist [phone] 07/30/2019 6:14 PM

## 2019-07-30 NOTE — Plan of Care (Signed)

## 2019-07-30 NOTE — Progress Notes (Signed)
Received a call from Telemonitor that Pt's HR was in the 130's . Checked on the Pt, he was up to the sink cleaning, and unable to get a clear reading on his EKG to determine a reading.  Pt was asymptomatic

## 2019-07-30 NOTE — Significant Event (Signed)
Rapid Response MEWS Note  Overview: Time Called: 1010 Arrival Time: 1030 Event Type: MEWS  Outcome: Stayed in room and stabalized Event End Time: 1035   Assessment: Called for patient having red MEWS. Orthostatic VS were being checked and pt was orthostatic positive (see flowsheet). Pt had a decline in his BP upon standing and pt tachycardia increased to 120-130 bpm. RN had order for 1L IVF bolus. Rapid response not needed at time of initial call and RN had orders. RR RN rounded on pt to find him lying in bed, alert. Lungs clear, diminished. Fluid bolus infusing. RN to trend pt's vital signs and address orthostatic positive with primary MD.  RN to call rapid response for further needs.  Jennye Moccasin

## 2019-07-30 NOTE — Progress Notes (Signed)
Occupational Therapy Treatment Patient Details Name: Charles Walters MRN: [id number] DOB: 16-Sep-1960 Today's Date: 07/30/2019    History of present illness This 59 y.o. male admitted with intractable hiccups, loss of appetite, watery diarrhea, and DOE. Dx: acute respiratory failure with hypoxia, multifocal PNA, hyponatremia.  PMH includes: HTN   OT comments  Pt progressing to OOB functional task at sink in standing. Education for energy conservation and HEP with green theraband. Pt given breathing exercises with theraband exercises. O2 sats 97-98% on RA. Pursed lip breathing techniques. Pt would benefit from continued OT skilled services. OT following acutely for energy conservation techniques.   Follow Up Recommendations  No OT follow up;Supervision - Intermittent    Equipment Recommendations  Tub/shower seat    Recommendations for Other Services      Precautions / Restrictions Precautions Precautions: Fall Restrictions Weight Bearing Restrictions: No       Mobility Bed Mobility Overal bed mobility: Independent                Transfers Overall transfer level: Needs assistance Equipment used: None Transfers: Sit to/from Stand           General transfer comment: Pt increasing functional to no AD for mobility    Balance Overall balance assessment: Needs assistance Sitting-balance support: No upper extremity supported;Feet supported Sitting balance-Leahy Scale: Good     Standing balance support: No upper extremity supported;During functional activity Standing balance-Leahy Scale: Fair                             ADL either performed or assessed with clinical judgement   ADL       Grooming: Supervision/safety;Standing               Lower Body Dressing: Min guard;Sitting/lateral leans;Sit to/from stand               Functional mobility during ADLs: Supervision/safety General ADL Comments: Education for energy conservation and HEP  with green theraband. Pt given breathing exercises with theraband exercises.     Vision       Perception     Praxis      Cognition Arousal/Alertness: Awake/alert Behavior During Therapy: WFL for tasks assessed/performed Overall Cognitive Status: Within Functional Limits for tasks assessed                                          Exercises Exercises: Other exercises Other Exercises Other Exercises: BUE theraband, green level   Shoulder Instructions       General Comments O2 sats 97-98% on RA. Pursed lip breathing techniques.    Pertinent Vitals/ Pain       Pain Assessment: No/denies pain  Home Living                                          Prior Functioning/Environment              Frequency  Min 2X/week        Progress Toward Goals  OT Goals(current goals can now be found in the care plan section)  Progress towards OT goals: Progressing toward goals  Acute Rehab OT Goals Patient Stated Goal: get my strength back  OT Goal Formulation: With patient Time For  Goal Achievement: 08/12/19 Potential to Achieve Goals: Good ADL Goals Pt Will Perform Grooming: with modified independence;standing Pt Will Perform Upper Body Bathing: with modified independence;standing Pt Will Perform Lower Body Bathing: with modified independence;sit to/from stand Pt Will Perform Upper Body Dressing: with modified independence;sitting;standing Pt Will Perform Lower Body Dressing: with modified independence;sit to/from stand Pt Will Transfer to Toilet: with modified independence;ambulating;regular height toilet;grab bars Pt Will Perform Toileting - Clothing Manipulation and hygiene: with modified independence;sit to/from stand Pt Will Perform Tub/Shower Transfer: Tub transfer;with modified independence;ambulating;shower seat Pt/caregiver will Perform Home Exercise Program: Increased strength;Right Upper extremity;Left upper extremity;With written  HEP provided;With theraband;Independently Additional ADL Goal #1: Pt will be independent with energy conservation techniques  Plan Discharge plan remains appropriate    Co-evaluation                 AM-PAC OT "6 Clicks" Daily Activity     Outcome Measure   Help from another person eating meals?: None Help from another person taking care of personal grooming?: A Little Help from another person toileting, which includes using toliet, bedpan, or urinal?: A Little Help from another person bathing (including washing, rinsing, drying)?: A Little Help from another person to put on and taking off regular upper body clothing?: A Little Help from another person to put on and taking off regular lower body clothing?: A Little 6 Click Score: 19    End of Session Equipment Utilized During Treatment: Oxygen  OT Visit Diagnosis: Unsteadiness on feet (R26.81)   Activity Tolerance Patient limited by fatigue   Patient Left in bed;with call bell/phone within reach   Nurse Communication Mobility status        Time: 6659-9357 OT Time Calculation (min): 25 min  Charges: OT General Charges $OT Visit: 1 Visit OT Treatments $Self Care/Home Management : 8-22 mins $Therapeutic Exercise: 8-22 mins  Jefferey Pica, OTR/L Acute Rehabilitation Services Pager: [phone] Office: [place] 07/30/2019, 1:49 PM

## 2019-07-30 NOTE — Progress Notes (Addendum)
Charles Walters [facility]  PROGRESS NOTE    Charles Walters  ZSW:[id number] DOB: 1960-07-09 DOA: 07/18/2019 PCP: [name], NP   Brief Narrative:   Charles Walters an 59 y.o.malewith past medical history significant for HTN who presented to [facility] ED for treatment of hiccups. Patient states that he was in his usual state of reasonable health until 1 week ago when he developed intractable hiccups as well as loss of appetite, watery diarrhea which occurs 1 time a day and some dyspnea on exertion. Patient states he rested and was treating himself conservatively however the hiccups bothered him so much that he was unable to eat. When the hiccups did not resolve he came to the ED.  Patient states that he feels generally well according to him. He does admit that he is short of breath with ambulation but is not short of breath at rest. Patient denies fevers or chills. He denies any cough or hemoptysis. He does admit to decreased appetite as well as inability to eat due to intractable hiccups. Denies any abdominal pain. No profound fatigue or malaise. Not sure if he has had any exposure to Covid.  07/30/19: Na+ is stable. He's desatting with ambulation and CXR looks worse. Have ordered sputum Cx. Talked with Pulm. Appreciate their assistance. His orthostatics were positive, so bolused some fluids. This is strange as he has been on fluids the entire time he's been here.   UPDATE: He is starting to develop fevers. He's become more tachypneic and tachy since d/c'ing vanc. I have asked pharmacy to re-dose vanc for him. Will get Bld Cx in addition to our sputum Cx.    Assessment & Plan:   Principal Problem:   Hyponatremia Active Problems:   Intractable hiccups   Diarrhea   Asthenia   Essential hypertension  Hyponatremia - multifactorial etiology including combination of HCTZ, losartan, diarrhea, decreased p.o. 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 L ast labs we have prior to this admission has him at about the same. He follows with [facility]. Will try to obtain sodium records. He is much more alert this AM and not displaying any neurological symptoms - 07/28/19: Na+ improving. Continue fluids. - 07/29/19: Na+ is stable. He has no neurological deficits at this time. He is likely chronically low. He needs to avoid HCTZ. Have counseled him on that.     - 07/30/19: stable, monitor  Acute respiratory failure w/ hypoxia Multifocal PNA - Atypical PNA vs pulmonary fibrosis on CXR - procal mildly elevated - COVID negative - inflamatory markers are up, but improving - multifocal pneumonia seen on CT - elevated d-dimer, so ordered CTA PE, results pending - being treated with CAP coverage, will broaden abx to vanc, cefepime w/ pharm to dose. - 07/27/19: CTA negative for clot. Resp status improving with abx. Add incentive spiro. Doing well on RA duringinterview - 07/28/19: change to lvq 750mg  to complete 7 total days of abx coverage - 07/29/19: Day 4 of abx coverage. Needs 3 more days at discharge. Respiratory not back to baseline after evaluation with PT; may need O2 at discharge, will speak with CM     - 07/30/19: Desats with ambulation still, and gets tachycardic. Orthostatics were positive so fluid bolus given. Have asked pulm to review. Appreciate their assistance.  Will still likely need to go home on O2. Will get formal 6MWT. Continue abx.   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 - Echocardiogram: Left Ventricle: Left ventricular ejection fraction, by estimation, is 60 to 65%.  The left ventricle has normal function. The left ventricle has no regional wall motion abnormalities. The left ventricular internal cavity size was normal in size. There is no left ventricular hypertrophy. Left ventricular diastolic parameters are consistent with Grade I diastolic dysfunction (impaired relaxation). Right Ventricle: The right ventricular size is normal. No increase in right ventricular wall thickness. Right ventricular systolic function is  low normal. There is moderately elevated pulmonary artery systolic pressure. The tricuspid regurgitant velocity is 3.08 m/s, and with an assumed right atrial pressure of 3 mmHg, the  estimated right ventricular systolic pressure is 98.9 mmHg.  Diarrhea - Patient states he has been having diarrhea for a week, however states is only 1 time a day liquid stool - c diff ordered, GI PCR ordered - 07/27/19: No loose stools noted ON; lab still not collected - 07/28/19: resolved  HTN - continue norvasc - hold HCTZ, losartan d/t hyponatremia - 07/27/19: BP ok, continue current Tx. - 07/28/19: BP is ok on amlodipine; he should never take HCTZ again d/t hyponatremia - 07/28/19: Amlodipine is doing well for him. Doesn't need losartan right now. He should avoid HCTZ.     - 07/29/19: BP acceptable.     - see above about orthostatics  Hiccups - not complaining of hiccups this morning, none witness during time in room - 07/27/19: denies complaint this morning  Falls Generalized weakness - reports multiple falls over the last month - PT/OT to see.     - 07/29/19: during eval w/ PT; per nursing, pt became tachypneic, tachycardic, and desatted to the mid-80's. He is now back on 2L [place] during second interview of the day; not ready for d/c just yet.     - 07/30/19: PT: rec HHPT  DVT prophylaxis: lovenox Code Status: FULL Family Communication: None at bedside   Disposition Plan: Remains inpt for increased  O2 need   Consultants:   Nephrology  PCCM  Antimicrobials:  . levaquin   ROS:  Denies CP, N, V, ab pain. Reports dyspnea. Remainder 10-pt ROS is negative for all not previously mentioned.  Subjective: "I'll be here. I can't go anywhere."  Objective: Vitals:   07/30/19 1300 07/30/19 1400 07/30/19 1431 07/30/19 1435  BP: 128/86 (!) 148/87    Pulse: (!) 115 (!) 126 (!) 116   Resp: (!) 40 (!) 24 20   Temp:    98.4 F (36.9 C)  TempSrc:    Oral  SpO2: 94%  98%   Weight:      Height:        Intake/Output Summary (Last 24 hours) at 07/30/2019 1437 Last data filed at 07/30/2019 1109 Gross per 24 hour  Intake 4048.43 ml  Output 1250 ml  Net 2798.43 ml   Filed Weights   August 03, 2019 1319 07/26/19 0000  Weight: 79.4 kg 64.4 kg    Examination:  General: 59 y.o. male resting in bed in NAD Cardiovascular: RRR, +S1, S2, no m/g/r, equal pulses throughout Respiratory: CTABL, no w/r/r, normal WOB GI: BS+, NDNT, no masses noted, no organomegaly noted MSK: No e/c/c Neuro: A&O x 3, no focal  deficits Psyc: Appropriate interaction and affect, calm/cooperative   Data Reviewed: I have personally reviewed following labs and imaging studies.  CBC: Recent Labs  Lab Aug 12, 2019 2348 07/26/19 0508 07/27/19 0826 07/28/19 0616 07/29/19 0625  WBC 9.8 9.5 8.0 8.6 9.8  NEUTROABS  --   --  6.4 6.6 7.9*  HGB 11.7* 12.1* 9.9* 9.0* 8.8*  HCT 31.1* 32.3* 26.9* 24.9* 24.1*  MCV 86.9 85.7 88.5 88.6 88.3  PLT 217 198 179 158 175   Basic Metabolic Panel: Recent Labs  Lab 12-Aug-2019 1611 12-Aug-2019 1800 08-12-19 2348 Aug 12, 2019 2349 07/26/19 0508 07/26/19 0651 07/27/19 0826 07/27/19 1241 07/28/19 0616 07/28/19 0953 07/28/19 1350 07/28/19 1744 07/28/19 2207 07/29/19 0201 07/29/19 0625  NA 114*   < >  --    < > 120*   < > 124*   < > 126*   < > 127* 127* 128* 126* 123*  126*  K 3.4*  --   --   --  3.9  --  3.5  --  3.6  --   --   --   --   --  3.6  CL 79*  --   --   --  89*  --  95*  --   96*  --   --   --   --   --  96*  CO2 20*  --   --   --  16*  --  19*  --  20*  --   --   --   --   --  21*  GLUCOSE 107*  --   --   --  98  --  127*  --  108*  --   --   --   --   --  99  BUN 7  --   --   --  10  --  10  --  9  --   --   --   --   --  6  CREATININE 0.64   < > 0.68  --  0.68  --  0.67  --  0.64  --   --   --   --   --  0.55*  CALCIUM 8.6*  --   --   --  8.4*  --  8.0*  --  8.1*  --   --   --   --   --  7.9*  MG  --   --   --   --   --   --  1.9  --  1.8  --   --   --   --   --  1.6*  PHOS  --   --   --   --  3.1  --   --   --   --   --   --   --   --   --   --    < > = values in this interval not displayed.   GFR: Estimated Creatinine Clearance: 92.8 mL/min (A) (by C-G formula based on SCr of 0.55 mg/dL (L)). Liver Function Tests: Recent Labs  Lab 07/26/19 0508 07/27/19 0826 07/28/19 0616 07/29/19 0625  AST 31 29 32 40  ALT 22 22 24  33  ALKPHOS 59 49 52 54  BILITOT 1.8* 1.3* 0.7 0.4  PROT 7.1 6.0* 5.9* 5.5*  ALBUMIN 2.2* 1.8* 1.8* 1.6*   No results for input(s): LIPASE, AMYLASE in the last 168 hours. No results for input(s):  AMMONIA in the last 168 hours. Coagulation Profile: No results for input(s): INR, PROTIME in the last 168 hours. Cardiac Enzymes: No results for input(s): CKTOTAL, CKMB, CKMBINDEX, TROPONINI in the last 168 hours. BNP (last 3 results) No results for input(s): PROBNP in the last 8760 hours. HbA1C: No results for input(s): HGBA1C in the last 72 hours. CBG: No results for input(s): GLUCAP in the last 168 hours. Lipid Profile: No results for input(s): CHOL, HDL, LDLCALC, TRIG, CHOLHDL, LDLDIRECT in the last 72 hours. Thyroid Function Tests: No results for input(s): TSH, T4TOTAL, FREET4, T3FREE, THYROIDAB in the last 72 hours. Anemia Panel: No results for input(s): VITAMINB12, FOLATE, FERRITIN, TIBC, IRON, RETICCTPCT in the last 72 hours. Sepsis Labs: Recent Labs  Lab 07/28/2019 1900  PROCALCITON 0.53    Recent Results (from the past  240 hour(s))  SARS CORONAVIRUS 2 (TAT 6-24 HRS) Nasopharyngeal Nasopharyngeal Swab     Status: None   Collection Time: 07/23/2019  2:32 PM   Specimen: Nasopharyngeal Swab  Result Value Ref Range Status   SARS Coronavirus 2 NEGATIVE NEGATIVE Final    Comment: (NOTE) SARS-CoV-2 target nucleic acids are NOT DETECTED. The SARS-CoV-2 RNA is generally detectable in upper and lower respiratory specimens during the acute phase of infection. Negative results do not preclude SARS-CoV-2 infection, do not rule out co-infections with other pathogens, and should not be used as the sole basis for treatment or other patient management decisions. Negative results must be combined with clinical observations, patient history, and epidemiological information. The expected result is Negative. Fact Sheet for Patients: HairSlick.no Fact Sheet for Healthcare Providers: quierodirigir.com This test is not yet approved or cleared by the [place] FDA and  has been authorized for detection and/or diagnosis of SARS-CoV-2 by FDA under an Emergency Use Authorization (EUA). This EUA will remain  in effect (meaning this test can be used) for the duration of the COVID-19 declaration under Section 56 4(b)(1) of the Act, 21 U.S.C. section 360bbb-3(b)(1), unless the authorization is terminated or revoked sooner. Performed at [facility], [address] [place], [place] [address]   MRSA PCR Screening     Status: None   Collection Time: 07/26/19 12:05 AM   Specimen: Nasal Mucosa; Nasopharyngeal  Result Value Ref Range Status   MRSA by PCR NEGATIVE NEGATIVE Final    Comment:        The GeneXpert MRSA Assay (FDA approved for NASAL specimens only), is one component of a comprehensive MRSA colonization surveillance program. It is not intended to diagnose MRSA infection nor to guide or monitor treatment for MRSA infections. Performed at [facility], [address], [place], [place] [address]   Respiratory Panel by PCR     Status: None   Collection Time: 07/26/19  2:12 PM   Specimen: Nasopharyngeal Swab; Respiratory  Result Value Ref Range Status   Adenovirus NOT DETECTED NOT DETECTED Final   Coronavirus 229E NOT DETECTED NOT DETECTED Final    Comment: (NOTE) The Coronavirus on the Respiratory Panel, DOES NOT test for the novel  Coronavirus (2019 nCoV)    Coronavirus HKU1 NOT DETECTED NOT DETECTED Final   Coronavirus NL63 NOT DETECTED NOT DETECTED Final   Coronavirus OC43 NOT DETECTED NOT DETECTED Final   Metapneumovirus NOT DETECTED NOT DETECTED Final   Rhinovirus / Enterovirus NOT DETECTED NOT DETECTED Final   Influenza A NOT DETECTED NOT DETECTED Final   Influenza B NOT DETECTED NOT DETECTED Final   Parainfluenza Virus 1 NOT  DETECTED NOT DETECTED Final   Parainfluenza Virus 2 NOT DETECTED NOT DETECTED Final   Parainfluenza Virus 3 NOT DETECTED NOT DETECTED Final   Parainfluenza Virus 4 NOT DETECTED NOT DETECTED Final   Respiratory Syncytial Virus NOT DETECTED NOT DETECTED Final   Bordetella pertussis NOT DETECTED NOT DETECTED Final   Chlamydophila pneumoniae NOT DETECTED NOT DETECTED Final   Mycoplasma pneumoniae NOT DETECTED NOT DETECTED Final    Comment: Performed at [facility], [address], [place], [place] [address]      Radiology Studies: DG Chest 2 View  Result Date: 07/30/2019 CLINICAL DATA:  Dyspnea, smoker, hypertension EXAM: CHEST - 2 VIEW COMPARISON:  07/29/2019 FINDINGS: Normal heart size, mediastinal contours, and pulmonary vascularity. LEFT basilar atelectasis versus infiltrate increased. RIGHT basilar infiltrate consistent with pneumonia, increased. Remaining lungs clear. No pleural effusion or pneumothorax. IMPRESSION: Increased RIGHT basilar infiltrate with additionally increased atelectasis versus infiltrate at LEFT base. Electronically Signed   By: Ulyses Southward M.D.   On: 07/30/2019 08:04      Scheduled Meds: . enoxaparin (LOVENOX) injection  40 mg Subcutaneous QHS  . feeding supplement (ENSURE ENLIVE)  237 mL Oral TID BM  . levofloxacin  750 mg Oral Daily  . mouth rinse  15 mL Mouth Rinse BID  . multivitamin with minerals  1 tablet Oral Daily  . sodium chloride flush  3 mL Intravenous Once   Continuous Infusions: . sodium chloride 75 mL/hr at 07/30/19 1109     LOS: 5 days    Time spent: 35 minutes spent in the coordination of care today.    Teddy Spike, DO [facility]  If 7PM-7AM, please contact night-coverage [email or web address] 07/30/2019, 2:37 PM

## 2019-07-31 LAB — CBC WITH DIFFERENTIAL/PLATELET
Abs Immature Granulocytes: 0.1 10*3/uL — ABNORMAL HIGH (ref 0.00–0.07)
Basophils Absolute: 0 10*3/uL (ref 0.0–0.1)
Basophils Relative: 0 %
Eosinophils Absolute: 0 10*3/uL (ref 0.0–0.5)
Eosinophils Relative: 0 %
HCT: 25.8 % — ABNORMAL LOW (ref 39.0–52.0)
Hemoglobin: 9.1 g/dL — ABNORMAL LOW (ref 13.0–17.0)
Immature Granulocytes: 1 %
Lymphocytes Relative: 7 %
Lymphs Abs: 0.8 10*3/uL (ref 0.7–4.0)
MCH: 31.8 pg (ref 26.0–34.0)
MCHC: 35.3 g/dL (ref 30.0–36.0)
MCV: 90.2 fL (ref 80.0–100.0)
Monocytes Absolute: 1 10*3/uL (ref 0.1–1.0)
Monocytes Relative: 9 %
Neutro Abs: 8.5 10*3/uL — ABNORMAL HIGH (ref 1.7–7.7)
Neutrophils Relative %: 83 %
Platelets: 206 10*3/uL (ref 150–400)
RBC: 2.86 MIL/uL — ABNORMAL LOW (ref 4.22–5.81)
RDW: 12.2 % (ref 11.5–15.5)
WBC: 10.3 10*3/uL (ref 4.0–10.5)
nRBC: 0 % (ref 0.0–0.2)

## 2019-07-31 LAB — COMPREHENSIVE METABOLIC PANEL
ALT: 42 U/L (ref 0–44)
AST: 47 U/L — ABNORMAL HIGH (ref 15–41)
Albumin: 1.7 g/dL — ABNORMAL LOW (ref 3.5–5.0)
Alkaline Phosphatase: 58 U/L (ref 38–126)
Anion gap: 6 (ref 5–15)
BUN: 5 mg/dL — ABNORMAL LOW (ref 6–20)
CO2: 27 mmol/L (ref 22–32)
Calcium: 8.3 mg/dL — ABNORMAL LOW (ref 8.9–10.3)
Chloride: 95 mmol/L — ABNORMAL LOW (ref 98–111)
Creatinine, Ser: 0.61 mg/dL (ref 0.61–1.24)
GFR calc Af Amer: >60 mL/min (ref >60)
GFR calc non Af Amer: >60 mL/min (ref >60)
Glucose, Bld: 118 mg/dL — ABNORMAL HIGH (ref 70–99)
Potassium: 4 mmol/L (ref 3.5–5.1)
Sodium: 128 mmol/L — ABNORMAL LOW (ref 135–145)
Total Bilirubin: 0.8 mg/dL (ref 0.3–1.2)
Total Protein: 6.2 g/dL — ABNORMAL LOW (ref 6.5–8.1)

## 2019-07-31 LAB — RHEUMATOID FACTOR: Rheumatoid fact SerPl-aCnc: 14.2 IU/mL — ABNORMAL HIGH (ref 0.0–13.9)

## 2019-07-31 LAB — MAGNESIUM: Magnesium: 1.6 mg/dL — ABNORMAL LOW (ref 1.7–2.4)

## 2019-07-31 LAB — ANA: Anti Nuclear Antibody (ANA): NEGATIVE

## 2019-07-31 NOTE — Progress Notes (Signed)
Physical Therapy Treatment Patient Details Name: Charles Walters MRN: [id number] DOB: 11/03/1960 Today's Date: 07/31/2019    History of Present Illness This 59 y.o. male admitted with intractable hiccups, loss of appetite, watery diarrhea, and DOE. Dx: acute respiratory failure with hypoxia, multifocal PNA, hyponatremia.  PMH includes: HTN    PT Comments    Patient seen for mobility progression. Pt tolerated gait distance of 80 ft with standing rest break due to DOE, elevated HR to 140 bpm, and increased RR. 4L O2 via [place] needed to maintain SpO2 >88% while ambulating. Pt will continue to benefit from further skilled PT services to maximize independence and safety with mobility.     Follow Up Recommendations  Home health PT     Equipment Recommendations  None recommended by PT    Recommendations for Other Services       Precautions / Restrictions Precautions Precautions: Fall Restrictions Weight Bearing Restrictions: No    Mobility  Bed Mobility               General bed mobility comments: pt OOB in chair upon arrival  Transfers Overall transfer level: Needs assistance Equipment used: None Transfers: Sit to/from Stand Sit to Stand: Supervision            Ambulation/Gait Ambulation/Gait assistance: Min guard;Supervision Gait Distance (Feet): (80 ft total with standing rest break) Assistive device: None Gait Pattern/deviations: Step-through pattern(minimal knee flexion)     General Gait Details: slow, guarded movements; decreased bilat step lengths; limited by DOE; pt tremulous with mobility   Stairs             Wheelchair Mobility    Modified Rankin (Stroke Patients Only)       Balance Overall balance assessment: Needs assistance Sitting-balance support: No upper extremity supported;Feet supported Sitting balance-Leahy Scale: Good     Standing balance support: No upper extremity supported;During functional activity Standing balance-Leahy  Scale: Fair                              Cognition Arousal/Alertness: Awake/alert Behavior During Therapy: WFL for tasks assessed/performed Overall Cognitive Status: Within Functional Limits for tasks assessed                                        Exercises      General Comments General comments (skin integrity, edema, etc.): HR up to 140 bpm with mobility and 4L O2 via [place] required to maintain SpO2 >88% while ambulating; at rest pt on 2L and SpO2 90% and HR in 120s end of session      Pertinent Vitals/Pain Pain Assessment: No/denies pain    Home Living                      Prior Function            PT Goals (current goals can now be found in the care plan section) Progress towards PT goals: Progressing toward goals    Frequency    Min 3X/week      PT Plan Current plan remains appropriate    Co-evaluation              AM-PAC PT "6 Clicks" Mobility   Outcome Measure  Help needed turning from your back to your side while in a flat bed without using bedrails?: None  Help needed moving from lying on your back to sitting on the side of a flat bed without using bedrails?: None Help needed moving to and from a bed to a chair (including a wheelchair)?: A Little Help needed standing up from a chair using your arms (e.g., wheelchair or bedside chair)?: A Little Help needed to walk in hospital room?: A Little Help needed climbing 3-5 steps with a railing? : A Little 6 Click Score: 20    End of Session Equipment Utilized During Treatment: Oxygen Activity Tolerance: Patient tolerated treatment well Patient left: with call bell/phone within reach;in chair Nurse Communication: Mobility status PT Visit Diagnosis: Difficulty in walking, not elsewhere classified (R26.2);Other (comment)(cardiopulmonary dysfunction)     Time: 8333-8329 PT Time Calculation (min) (ACUTE ONLY): 17 min  Charges:  $Gait Training: 8-22 mins                      Erline Levine, PTA Acute Rehabilitation Services Pager: [phone] Office: [phone]     Carolynne Edouard 07/31/2019, 4:55 PM

## 2019-07-31 NOTE — Progress Notes (Signed)
Charles Walters  PROGRESS NOTE    Charles Walters  SFS:[id number] DOB: [birth year] DOA: 08/03/2019 PCP: [name], NP   Brief Narrative:   Charles Walters an 59 y.o.malewith past medical history significant for HTN who presented to [facility] ED for treatment of hiccups. Patient states that he was in his usual state of reasonable health until 1 week ago when he developed intractable hiccups as well as loss of appetite, watery diarrhea which occurs 1 time a day and some dyspnea on exertion. Patient states he rested and was treating himself conservatively however the hiccups bothered him so much that he was unable to eat. When the hiccups did not resolve he came to the ED.  Patient states that he feels generally well according to him. He does admit that he is short of breath with ambulation but is not short of breath at rest. Patient denies fevers or chills. He denies any cough or hemoptysis. He does admit to decreased appetite as well as inability to eat due to intractable hiccups. Denies any abdominal pain. No profound fatigue or malaise. Not sure if he has had any exposure to Covid.  07/31/19: Started developing fevers on previous coverage yesterday. With CXR results being concerning; expanded abx back to vanc. Sputum ordered. Bld Cx ordered. Appreciate pulm assistance. Studies pending. Continue to work with IS. Continue mobility as able.     Assessment & Plan:   Principal Problem:   Hyponatremia Active Problems:   Intractable hiccups   Diarrhea   Asthenia   Essential hypertension  Hyponatremia - multifactorial etiology including combination of HCTZ, losartan, diarrhea, decreased p.o. 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 L ast labs we have prior to this admission has  him at about the same. He follows with [facility]. Will try to obtain sodium records. He is much more alert this AM and not displaying any neurological symptoms - 07/28/19: Na+ improving. Continue fluids. - 07/29/19: Na+ is stable. He has no neurological deficits at this time. He is likely chronically low. He needs to avoid HCTZ. Have counseled him on that.     - 07/31/19: Na+ 128 this AM; stable, monitor  Acute respiratory failure w/ hypoxia Multifocal PNA - Atypical PNA vs pulmonary fibrosis on CXR - procal mildly elevated - COVID negative - inflamatory markers are up, but improving - multifocal pneumonia seen on CT - elevated d-dimer, so ordered CTA PE, results pending - 07/27/19: CTA negative for clot. Resp status improving with abx. Add incentive spiro. Doing well on RA duringinterview - 07/28/19: change to lvq 750mg  to complete 7 total days of abx coverage - 07/29/19: Day 4 of abx coverage. Needs 3 more days at discharge.Respiratory not back to baseline after evaluation with PT; may need O2 at discharge, will speak with CM     - 07/30/19: Desats with ambulation still, and gets tachycardic. Orthostatics were positive so fluid bolus given. Have asked pulm to review. Appreciate their assistance. Will still likely need to go home on O2. Will get formal . Continue abx.      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 - Echocardiogram: Left Ventricle: Left ventricular ejection fraction, by estimation, is 60 to 65%. The left ventricle has normal function. The left ventricle has no regional wall  motion abnormalities. The left ventricular internal cavity size was normal in size. There is no left ventricular hypertrophy. Left ventricular diastolic parameters are consistent with Grade I diastolic dysfunction (impaired relaxation). Right Ventricle: The right ventricular size is normal. No increase in right ventricular wall thickness. Right ventricular systolic function is  low normal. There is moderately elevated pulmonary artery systolic pressure. The tricuspid regurgitant velocity is 3.08 m/s, and with an assumed right atrial pressure of 3 mmHg, the  estimated right ventricular systolic pressure is 40.9 mmHg.  Diarrhea - Patient states he has been having diarrhea for a week, however states is only 1 time a day liquid stool - c diff ordered, GI PCR ordered - 07/27/19: No loose stools noted ON; lab still not collected - 07/28/19: resolved  HTN - continue norvasc - hold HCTZ, losartan d/t hyponatremia - 07/27/19: BP ok, continue current Tx. - 07/28/19: BP is ok on amlodipine; he should never take HCTZ again d/t hyponatremia - 07/28/19: Amlodipine is doing well for him. Doesn't need losartan right now. He should avoid HCTZ. - 07/31/19: BP acceptable.       Hiccups - not complaining of hiccups this morning, none witness during time in room - 07/27/19: denies complaint this morning  Falls Generalized weakness - reports multiple falls over the last month - PT/OT to see. - 07/29/19: during eval w/ PT; per nursing, pt became tachypneic, tachycardic, and desatted to the mid-80's. He is now back on 2L [place] during second interview of the day; not ready for d/c just yet.     - 07/30/19: PT: rec HHPT  DVT prophylaxis: lovenox Code Status: FULL Family Communication: None at bedside   Disposition Plan: Remains inpt for increased O2 need, IV abx  Consultants:   Neprhology  PCCM  Antimicrobials:  . Lvq, vanc   ROS:  Denies CP,  ab pain, HA, D, N, V. Remainder 10-pt ROS is negative for all not previously mentioned.  Subjective: "I'm here. I won't ask that question."  Objective: Vitals:   07/30/19 2315 07/31/19 0300 07/31/19 0732 07/31/19 0900  BP: 125/82 131/86 (!) 131/91 130/85  Pulse: 95 98 94 (!) 108  Resp: 20 16 (!) 22 20  Temp: 98.2 F (36.8 C)  99.9 F (37.7 C)   TempSrc: Oral Oral Oral   SpO2: 96% 100% 97%   Weight:      Height:        Intake/Output Summary (Last 24 hours) at 07/31/2019 1031 Last data filed at 07/31/2019 6767 Gross per 24 hour  Intake 2721.1 ml  Output 2675 ml  Net 46.1 ml   Filed Weights   07/09/2019 1319 07/26/19 0000  Weight: 79.4 kg 64.4 kg    Examination:  General: 59 y.o. male resting in bed in NAD Cardiovascular: RRR, +S1, S2, no m/g/r Respiratory: soft rhonci right base otherwise clear, normal WOB GI: BS+, NDNT, soft MSK: No e/c/c Neuro: A&O x 3, no focal deficits Psyc: Appropriate interaction and affect, calm/cooperative   Data Reviewed: I have personally reviewed following labs and imaging studies.  CBC: Recent Labs  Lab 07/26/19 2094 07/27/19 7096 07/28/19 2836 07/29/19 6294 07/31/19 7654  WBC 9.5 8.0 8.6 9.8 10.3  NEUTROABS  --  6.4 6.6 7.9* 8.5*  HGB 12.1* 9.9* 9.0* 8.8* 9.1*  HCT 32.3* 26.9* 24.9* 24.1* 25.8*  MCV 85.7 88.5 88.6 88.3 90.2  PLT 198 179 158 175 417   Basic Metabolic Panel: Recent Labs  Lab 07/26/19 0508 07/26/19 0651 07/27/19 0826 07/27/19 1241 07/28/19 0616 07/28/19 0953 07/28/19 1744 07/28/19 2207 07/29/19 0201 07/29/19 0625 07/31/19 0837  NA 120*   < > 124*   < > 126*   < > 127* 128* 126* 123*  126* 128*  K 3.9  --  3.5  --  3.6  --   --   --   --  3.6 4.0  CL 89*  --  95*  --  96*  --   --   --   --  96* 95*  CO2 16*  --  19*  --  20*  --   --   --   --  21* 27  GLUCOSE 98  --  127*  --  108*  --   --   --   --  99 118*  BUN 10  --  10  --  9  --   --   --   --  6 5*  CREATININE 0.68  --  0.67  --  0.64  --    --   --   --  0.55* 0.61  CALCIUM 8.4*  --  8.0*  --  8.1*  --   --   --   --  7.9* 8.3*  MG  --   --  1.9  --  1.8  --   --   --   --  1.6* 1.6*  PHOS 3.1  --   --   --   --   --   --   --   --   --   --    < > = values in this interval not displayed.   GFR: Estimated Creatinine Clearance: 92.8 mL/min (by C-G formula based on SCr of 0.61 mg/dL). Liver Function Tests: Recent Labs  Lab 07/26/19 0508 07/27/19 0826 07/28/19 0616 07/29/19 0625 07/31/19 0837  AST 31 29 32 40 47*  ALT 22 22 24  33 42  ALKPHOS 59 49 52 54 58  BILITOT 1.8* 1.3* 0.7 0.4 0.8  PROT 7.1 6.0* 5.9* 5.5* 6.2*  ALBUMIN 2.2* 1.8* 1.8* 1.6* 1.7*   No results for input(s): LIPASE, AMYLASE in the last 168 hours. No results for input(s): AMMONIA in the last 168 hours. Coagulation Profile: No results for input(s): INR, PROTIME in the last 168 hours. Cardiac Enzymes: No results for input(s): CKTOTAL, CKMB, CKMBINDEX, TROPONINI in the last 168 hours. BNP (last 3 results) No results for input(s): PROBNP in the last 8760 hours. HbA1C: No results for input(s): HGBA1C in the last 72 hours. CBG: No results for input(s): GLUCAP in the last 168 hours. Lipid Profile: No results for input(s): CHOL, HDL, LDLCALC, TRIG, CHOLHDL, LDLDIRECT in the last 72 hours. Thyroid Function Tests: No results for input(s): TSH, T4TOTAL, FREET4, T3FREE, THYROIDAB in the last 72 hours. Anemia Panel: No results for input(s): VITAMINB12, FOLATE, FERRITIN, TIBC, IRON, RETICCTPCT in the last 72 hours. Sepsis Labs: Recent Labs  Lab 07-28-19 1900  PROCALCITON 0.53    Recent Results (from the past 240 hour(s))  SARS CORONAVIRUS 2 (TAT 6-24 HRS) Nasopharyngeal Nasopharyngeal Swab     Status: None   Collection Time:  07/11/2019  2:32 PM   Specimen: Nasopharyngeal Swab  Result Value Ref Range Status   SARS Coronavirus 2 NEGATIVE NEGATIVE Final    Comment: (NOTE) SARS-CoV-2 target nucleic acids are NOT DETECTED. The SARS-CoV-2 RNA is generally  detectable in upper and lower respiratory specimens during the acute phase of infection. Negative results do not preclude SARS-CoV-2 infection, do not rule out co-infections with other pathogens, and should not be used as the sole basis for treatment or other patient management decisions. Negative results must be combined with clinical observations, patient history, and epidemiological information. The expected result is Negative. Fact Sheet for Patients: HairSlick.no Fact Sheet for Healthcare Providers: quierodirigir.com This test is not yet approved or cleared by the [place] FDA and  has been authorized for detection and/or diagnosis of SARS-CoV-2 by FDA under an Emergency Use Authorization (EUA). This EUA will remain  in effect (meaning this test can be used) for the duration of the COVID-19 declaration under Section 56 4(b)(1) of the Act, 21 U.S.C. section 360bbb-3(b)(1), unless the authorization is terminated or revoked sooner. Performed at [facility], [address] [place], [place] [address]   MRSA PCR Screening     Status: None   Collection Time: 07/26/19 12:05 AM   Specimen: Nasal Mucosa; Nasopharyngeal  Result Value Ref Range Status   MRSA by PCR NEGATIVE NEGATIVE Final    Comment:        The GeneXpert MRSA Assay (FDA approved for NASAL specimens only), is one component of a comprehensive MRSA colonization surveillance program. It is not intended to diagnose MRSA infection nor to guide or monitor treatment for MRSA infections. Performed at [facility], [address] [place], [place] [address]   Respiratory Panel by PCR     Status: None   Collection Time: 07/26/19  2:12 PM   Specimen: Nasopharyngeal Swab; Respiratory  Result Value Ref Range Status   Adenovirus NOT DETECTED NOT DETECTED Final   Coronavirus 229E NOT DETECTED NOT DETECTED Final    Comment: (NOTE) The Coronavirus on the  Respiratory Panel, DOES NOT test for the novel  Coronavirus (2019 nCoV)    Coronavirus HKU1 NOT DETECTED NOT DETECTED Final   Coronavirus NL63 NOT DETECTED NOT DETECTED Final   Coronavirus OC43 NOT DETECTED NOT DETECTED Final   Metapneumovirus NOT DETECTED NOT DETECTED Final   Rhinovirus / Enterovirus NOT DETECTED NOT DETECTED Final   Influenza A NOT DETECTED NOT DETECTED Final   Influenza B NOT DETECTED NOT DETECTED Final   Parainfluenza Virus 1 NOT DETECTED NOT DETECTED Final   Parainfluenza Virus 2 NOT DETECTED NOT DETECTED Final   Parainfluenza Virus 3 NOT DETECTED NOT DETECTED Final   Parainfluenza Virus 4 NOT DETECTED NOT DETECTED Final   Respiratory Syncytial Virus NOT DETECTED NOT DETECTED Final   Bordetella pertussis NOT DETECTED NOT DETECTED Final   Chlamydophila pneumoniae NOT DETECTED NOT DETECTED Final   Mycoplasma pneumoniae NOT DETECTED NOT DETECTED Final    Comment: Performed at [facility], [address], [place], [place] [address]  Culture, blood (Routine X 2) w Reflex to ID Panel     Status: None (Preliminary result)   Collection Time: 07/30/19  6:00 PM   Specimen: BLOOD  Result Value Ref Range Status   Specimen Description BLOOD LEFT ANTECUBITAL  Final   Special Requests   Final    BOTTLES DRAWN AEROBIC AND ANAEROBIC Blood Culture adequate volume   Culture   Final  NO GROWTH < 24 HOURS Performed at [facility], [address] [place], [place] [address]    Report Status PENDING  Incomplete  Culture, blood (Routine X 2) w Reflex to ID Panel     Status: None (Preliminary result)   Collection Time: 07/30/19  6:07 PM   Specimen: BLOOD LEFT HAND  Result Value Ref Range Status   Specimen Description BLOOD LEFT HAND  Final   Special Requests   Final    BOTTLES DRAWN AEROBIC AND ANAEROBIC Blood Culture adequate volume   Culture   Final    NO GROWTH < 24 HOURS Performed at [facility], [address] [place], [place] [address]    Report  Status PENDING  Incomplete      Radiology Studies: DG Chest 2 View  Result Date: 07/30/2019 CLINICAL DATA:  Dyspnea, smoker, hypertension EXAM: CHEST - 2 VIEW COMPARISON:  07/26/2019 FINDINGS: Normal heart size, mediastinal contours, and pulmonary vascularity. LEFT basilar atelectasis versus infiltrate increased. RIGHT basilar infiltrate consistent with pneumonia, increased. Remaining lungs clear. No pleural effusion or pneumothorax. IMPRESSION: Increased RIGHT basilar infiltrate with additionally increased atelectasis versus infiltrate at LEFT base. Electronically Signed   By: Ulyses Southward M.D.   On: 07/30/2019 08:04     Scheduled Meds: . enoxaparin (LOVENOX) injection  40 mg Subcutaneous QHS  . feeding supplement (ENSURE ENLIVE)  237 mL Oral TID BM  . levofloxacin  750 mg Oral Daily  . mouth rinse  15 mL Mouth Rinse BID  . multivitamin with minerals  1 tablet Oral Daily  . sodium chloride flush  3 mL Intravenous Once   Continuous Infusions: . vancomycin 1,250 mg (07/31/19 0604)     LOS: 6 days    Time spent: 25 minutes spent in the coordination of care today.    Teddy Spike, DO [facility]  If 7PM-7AM, please contact night-coverage [email or web address] 07/31/2019, 10:31 AM

## 2019-07-31 NOTE — Plan of Care (Signed)

## 2019-08-01 DIAGNOSIS — J159 Unspecified bacterial pneumonia: Secondary | ICD-10-CM | POA: Diagnosis present

## 2019-08-01 LAB — CBC WITH DIFFERENTIAL/PLATELET
Abs Immature Granulocytes: 0.07 10*3/uL (ref 0.00–0.07)
Basophils Absolute: 0 10*3/uL (ref 0.0–0.1)
Basophils Relative: 0 %
Eosinophils Absolute: 0.1 10*3/uL (ref 0.0–0.5)
Eosinophils Relative: 0 %
HCT: 22.2 % — ABNORMAL LOW (ref 39.0–52.0)
Hemoglobin: 8 g/dL — ABNORMAL LOW (ref 13.0–17.0)
Immature Granulocytes: 1 %
Lymphocytes Relative: 7 %
Lymphs Abs: 0.8 10*3/uL (ref 0.7–4.0)
MCH: 31.9 pg (ref 26.0–34.0)
MCHC: 36 g/dL (ref 30.0–36.0)
MCV: 88.4 fL (ref 80.0–100.0)
Monocytes Absolute: 1.2 10*3/uL — ABNORMAL HIGH (ref 0.1–1.0)
Monocytes Relative: 11 %
Neutro Abs: 9.2 10*3/uL — ABNORMAL HIGH (ref 1.7–7.7)
Neutrophils Relative %: 81 %
Platelets: 215 10*3/uL (ref 150–400)
RBC: 2.51 MIL/uL — ABNORMAL LOW (ref 4.22–5.81)
RDW: 11.9 % (ref 11.5–15.5)
WBC: 11.4 10*3/uL — ABNORMAL HIGH (ref 4.0–10.5)
nRBC: 0 % (ref 0.0–0.2)

## 2019-08-01 LAB — COMPREHENSIVE METABOLIC PANEL
ALT: 43 U/L (ref 0–44)
AST: 45 U/L — ABNORMAL HIGH (ref 15–41)
Albumin: 1.6 g/dL — ABNORMAL LOW (ref 3.5–5.0)
Alkaline Phosphatase: 56 U/L (ref 38–126)
Anion gap: 9 (ref 5–15)
BUN: 7 mg/dL (ref 6–20)
CO2: 25 mmol/L (ref 22–32)
Calcium: 8.3 mg/dL — ABNORMAL LOW (ref 8.9–10.3)
Chloride: 96 mmol/L — ABNORMAL LOW (ref 98–111)
Creatinine, Ser: 0.61 mg/dL (ref 0.61–1.24)
GFR calc Af Amer: >60 mL/min (ref >60)
GFR calc non Af Amer: >60 mL/min (ref >60)
Glucose, Bld: 139 mg/dL — ABNORMAL HIGH (ref 70–99)
Potassium: 3.5 mmol/L (ref 3.5–5.1)
Sodium: 130 mmol/L — ABNORMAL LOW (ref 135–145)
Total Bilirubin: 0.6 mg/dL (ref 0.3–1.2)
Total Protein: 5.9 g/dL — ABNORMAL LOW (ref 6.5–8.1)

## 2019-08-01 LAB — MAGNESIUM: Magnesium: 1.7 mg/dL (ref 1.7–2.4)

## 2019-08-01 LAB — CYCLIC CITRUL PEPTIDE ANTIBODY, IGG/IGA: CCP Antibodies IgG/IgA: 5 units (ref 0–19)

## 2019-08-01 NOTE — Progress Notes (Signed)
PT Cancellation Note  Patient Details Name: Charles Walters MRN: [id number] DOB: April 24, 1961   Cancelled Treatment:    Reason Eval/Treat Not Completed: Patient declined, no reason specified Pt just finished eating dinner and declined mobilizing. PT will continue to follow acutely.    Erline Levine, PTA Acute Rehabilitation Services Pager: [phone] Office: [phone]   08/01/2019, 4:18 PM

## 2019-08-01 NOTE — Progress Notes (Signed)
Occupational Therapy Treatment Patient Details Name: Charles Walters MRN: [id number] DOB: Mar 26, 1961 Today's Date: 08/01/2019    History of present illness This 59 y.o. male admitted with intractable hiccups, loss of appetite, watery diarrhea, and DOE. Dx: acute respiratory failure with hypoxia, multifocal PNA, hyponatremia.  PMH includes: HTN   OT comments  Pt performing HEP with green theraband with good carry over skills from previous session. Pt able to recall 2 energy conservation techniques. Pt's HR up to 137; O2 sats on 3-4L O2 O2 desatting to mid 80s with exertion. Pt would greatly benefit from continued OT skilled services for ADL, mobility and energy conservation. OT following acutely.     Follow Up Recommendations  No OT follow up;Supervision - Intermittent    Equipment Recommendations  Tub/shower seat    Recommendations for Other Services      Precautions / Restrictions Precautions Precautions: Fall Restrictions Weight Bearing Restrictions: No       Mobility Bed Mobility               General bed mobility comments: sitting EOB  Transfers Overall transfer level: Needs assistance Equipment used: None Transfers: Sit to/from Stand Sit to Stand: Supervision         General transfer comment: Pt increasing functional to no AD for mobility    Balance Overall balance assessment: Needs assistance Sitting-balance support: No upper extremity supported;Feet supported Sitting balance-Leahy Scale: Good     Standing balance support: No upper extremity supported;During functional activity Standing balance-Leahy Scale: Fair                             ADL either performed or assessed with clinical judgement   ADL Overall ADL's : Needs assistance/impaired                                     Functional mobility during ADLs: Supervision/safety General ADL Comments: Education for energy conservation and HEP with green theraband. Pt  given breathing exercises with theraband exercises.     Vision       Perception     Praxis      Cognition Arousal/Alertness: Awake/alert Behavior During Therapy: WFL for tasks assessed/performed Overall Cognitive Status: Within Functional Limits for tasks assessed                                          Exercises Exercises: Other exercises;General Upper Extremity General Exercises - Upper Extremity Shoulder Flexion: AROM;20 reps;Seated;Theraband Theraband Level (Shoulder Flexion): Level 3 (Green) Shoulder ABduction: AROM;20 reps;Seated;Theraband Theraband Level (Shoulder Abduction): Level 3 (Green) Shoulder Horizontal ABduction: AROM;20 reps;Seated;Theraband Theraband Level (Shoulder Horizontal Abduction): Level 3 (Green) Elbow Flexion: AROM;20 reps;Seated;Theraband Theraband Level (Elbow Flexion): Level 3 (Green) Elbow Extension: AROM;20 reps;Seated;Theraband Theraband Level (Elbow Extension): Level 3 (Green) Other Exercises Other Exercises: BUE theraband, green level   Shoulder Instructions       General Comments HR up to 137; O2 sats on 3-4L O2 O2 desatting to mid 80s with exertion.    Pertinent Vitals/ Pain       Pain Assessment: No/denies pain  Home Living  Prior Functioning/Environment              Frequency  Min 2X/week        Progress Toward Goals  OT Goals(current goals can now be found in the care plan section)  Progress towards OT goals: Progressing toward goals  Acute Rehab OT Goals Patient Stated Goal: get my strength back  OT Goal Formulation: With patient Time For Goal Achievement: 08/12/19 Potential to Achieve Goals: Good ADL Goals Pt Will Perform Grooming: with modified independence;standing Pt Will Perform Upper Body Bathing: with modified independence;standing Pt Will Perform Lower Body Bathing: with modified independence;sit to/from stand Pt Will  Perform Upper Body Dressing: with modified independence;sitting;standing Pt Will Perform Lower Body Dressing: with modified independence;sit to/from stand Pt Will Transfer to Toilet: with modified independence;ambulating;regular height toilet;grab bars Pt Will Perform Toileting - Clothing Manipulation and hygiene: with modified independence;sit to/from stand Pt Will Perform Tub/Shower Transfer: Tub transfer;with modified independence;ambulating;shower seat Pt/caregiver will Perform Home Exercise Program: Increased strength;Right Upper extremity;Left upper extremity;With written HEP provided;With theraband;Independently Additional ADL Goal #1: Pt will be independent with energy conservation techniques  Plan Discharge plan remains appropriate    Co-evaluation                 AM-PAC OT "6 Clicks" Daily Activity     Outcome Measure   Help from another person eating meals?: None Help from another person taking care of personal grooming?: A Little Help from another person toileting, which includes using toliet, bedpan, or urinal?: A Little Help from another person bathing (including washing, rinsing, drying)?: A Little Help from another person to put on and taking off regular upper body clothing?: A Little Help from another person to put on and taking off regular lower body clothing?: A Little 6 Click Score: 19    End of Session Equipment Utilized During Treatment: Oxygen  OT Visit Diagnosis: Unsteadiness on feet (R26.81)   Activity Tolerance Patient limited by fatigue   Patient Left in bed;with call bell/phone within reach   Nurse Communication Mobility status        Time: 8250-5397 OT Time Calculation (min): 27 min  Charges: OT General Charges $OT Visit: 1 Visit OT Treatments $Self Care/Home Management : 8-22 mins $Therapeutic Exercise: 8-22 mins  Charles Walters, OTR/L Acute Rehabilitation Services Pager: [phone] Office: [phone]    Charles Walters C 08/01/2019,  1:50 PM

## 2019-08-01 NOTE — Progress Notes (Signed)
PROGRESS NOTE    Charles Walters  GBT:[id number] DOB: [birth year] DOA: 09/01/2019 PCP: Marliss Coots, NP    Brief Narrative:  Patient was admitted to the hospital with a working diagnosis of community-acquired pneumonia complicated by acute hypoxic respiratory failure and severe hyponatremia.   59 year old male who presented with hiccups.  He does have significant past medical history for hypertension.  He reported about 1 week of intractable hiccups associated with loss of appetite, and watery diarrhea.  Positive dyspnea on exertion.  On his initial physical examination blood pressure 156/119, heart rate 108, respiratory rate 27, oxygen saturation 98%, his lungs had coarse breath sounds bilaterally, no wheezing, heart S1-S2 present and rhythmic, abdomen was soft and nontender, no lower extremity edema. Sodium 114, potassium 3.0, chloride 80, bicarb 17, glucose 126, BUN 7, creatinine 0.77, white count 10.9, hemoglobin 12.3, hematocrit 33.1, platelets 235.  SARS COVID-19 negative.  Urinary osmolarity 507, urine sodium <10, alcohol less than 10.  His chest radiograph had bilateral bibasilar reticulonodular infiltrates, predominantly right base.  EKG 128 bpm, normal axis, normal intervals, right atrial enlargement, sinus rhythm, poor R wave progression, LVH, no ST segment T wave changes.  Patient received hypertonic saline and intravenous antibiotics, further work-up with CT chest negative for pulmonary embolism, echocardiography with preserved LV and RV systolic function.  Assessment & Plan:   Principal Problem:   Community acquired bacterial pneumonia Active Problems:   Hyponatremia   Intractable hiccups   Diarrhea   Asthenia   Essential hypertension   1. Acute hypoxic respiratory failure due to community acquired pneumonia/ bilateral. Patient had fever this am, T max 100.4 F, wbc is 11,4, cultures have bee no growth and viral panel is negative. Oxygenation is 97% on 2 L/ min per [place].  Doubt  MRSA pneumonia, clinical picture more likely atypical or viral pneumonia. Will dc Vancomycin and continue levofloxacin. Continue airway clearing techniques with incentive spirometer and flutter valve, out of bed to chair tid with meals. Follow cell count in am.   Plan for dc home in am.   2. Hyponatremia, hypo-osmolar, hypovolemic. Na up to 138, with K at 3,5 and serum bicarbonate at 25, will continue to follow up on renal function and electrolytes in am, hold on further IV fluids, patient is tolerating po well.  3. HTN. Blood pressure 127/84, will continue close monitoring, hold on diuretic therapy for now.   4. Diarrhea. Clinically improved, possible viral syndrome.   DVT prophylaxis: enoxaparin   Code Status: full Family Communication: no family at the bedside  Disposition Plan/ discharge barriers: will transfer to medical ward, discontinue telemetry, possible dc home in am.    Nutrition Status: Nutrition Problem: Increased nutrient needs Etiology: acute illness Signs/Symptoms: estimated needs Interventions: Ensure Enlive (each supplement provides 350kcal and 20 grams of protein), MVI     Skin Documentation:     Consultants:   Nephrology   Pulmonary   Procedures:     Antimicrobials:   Levofloxacin     Subjective: Patient is feeling better, dyspnea is improving but not yet back to baseline, no nausea or vomiting, no chest pain.   Objective: Vitals:   08/01/19 0400 08/01/19 0500 08/01/19 0510 08/01/19 0600  BP: 117/71 113/71  127/84  Pulse: (!) 111 98 92 90  Resp: (!) 28 (!) 21 18 18   Temp: (!) 100.8 F (38.2 C)   98.5 F (36.9 C)  TempSrc: Oral   Oral  SpO2: 97% 99% 99% 98%  Weight:  Height:        Intake/Output Summary (Last 24 hours) at 08/01/2019 0846 Last data filed at 08/01/2019 0300 Gross per 24 hour  Intake 560.55 ml  Output 925 ml  Net -364.45 ml   Filed Weights   07/23/2019 1319 07/26/19 0000  Weight: 79.4 kg 64.4 kg    Examination:    General: Not in pain or dyspnea.  Neurology: Awake and alert, non focal  E ENT: no pallor, no icterus, oral mucosa moist Cardiovascular: No JVD. S1-S2 present, rhythmic, no gallops, rubs, or murmurs. No lower extremity edema. Pulmonary: positive breath sounds bilaterally, adequate air movement, no wheezing, or rhonchi, mild rales at bases bilaterally. Gastrointestinal. Abdomen with no organomegaly, non tender, no rebound or guarding Skin. No rashes Musculoskeletal: no joint deformities     Data Reviewed: I have personally reviewed following labs and imaging studies  CBC: Recent Labs  Lab 07/27/19 0826 07/28/19 0616 07/29/19 0625 07/31/19 0837 08/01/19 0232  WBC 8.0 8.6 9.8 10.3 11.4*  NEUTROABS 6.4 6.6 7.9* 8.5* 9.2*  HGB 9.9* 9.0* 8.8* 9.1* 8.0*  HCT 26.9* 24.9* 24.1* 25.8* 22.2*  MCV 88.5 88.6 88.3 90.2 88.4  PLT 179 158 175 206 215   Basic Metabolic Panel: Recent Labs  Lab 07/26/19 0508 07/26/19 0651 07/27/19 0826 07/27/19 1241 07/28/19 0616 07/28/19 0953 07/28/19 2207 07/29/19 0201 07/29/19 0625 07/31/19 0837 08/01/19 0232  NA 120*   < > 124*   < > 126*   < > 128* 126* 123*  126* 128* 130*  K 3.9   < > 3.5  --  3.6  --   --   --  3.6 4.0 3.5  CL 89*   < > 95*  --  96*  --   --   --  96* 95* 96*  CO2 16*   < > 19*  --  20*  --   --   --  21* 27 25  GLUCOSE 98   < > 127*  --  108*  --   --   --  99 118* 139*  BUN 10   < > 10  --  9  --   --   --  6 5* 7  CREATININE 0.68   < > 0.67  --  0.64  --   --   --  0.55* 0.61 0.61  CALCIUM 8.4*   < > 8.0*  --  8.1*  --   --   --  7.9* 8.3* 8.3*  MG  --   --  1.9  --  1.8  --   --   --  1.6* 1.6* 1.7  PHOS 3.1  --   --   --   --   --   --   --   --   --   --    < > = values in this interval not displayed.   GFR: Estimated Creatinine Clearance: 92.8 mL/min (by C-G formula based on SCr of 0.61 mg/dL). Liver Function Tests: Recent Labs  Lab 07/27/19 0826 07/28/19 0616 07/29/19 0625 07/31/19 0837 08/01/19 0232    AST 29 32 40 47* 45*  ALT 22 24 33 42 43  ALKPHOS 49 52 54 58 56  BILITOT 1.3* 0.7 0.4 0.8 0.6  PROT 6.0* 5.9* 5.5* 6.2* 5.9*  ALBUMIN 1.8* 1.8* 1.6* 1.7* 1.6*   No results for input(s): LIPASE, AMYLASE in the last 168 hours. No results for input(s): AMMONIA in the last 168 hours.  Coagulation Profile: No results for input(s): INR, PROTIME in the last 168 hours. Cardiac Enzymes: No results for input(s): CKTOTAL, CKMB, CKMBINDEX, TROPONINI in the last 168 hours. BNP (last 3 results) No results for input(s): PROBNP in the last 8760 hours. HbA1C: No results for input(s): HGBA1C in the last 72 hours. CBG: No results for input(s): GLUCAP in the last 168 hours. Lipid Profile: No results for input(s): CHOL, HDL, LDLCALC, TRIG, CHOLHDL, LDLDIRECT in the last 72 hours. Thyroid Function Tests: No results for input(s): TSH, T4TOTAL, FREET4, T3FREE, THYROIDAB in the last 72 hours. Anemia Panel: No results for input(s): VITAMINB12, FOLATE, FERRITIN, TIBC, IRON, RETICCTPCT in the last 72 hours.    Radiology Studies: I have reviewed all of the imaging during this hospital visit personally     Scheduled Meds: . enoxaparin (LOVENOX) injection  40 mg Subcutaneous QHS  . feeding supplement (ENSURE ENLIVE)  237 mL Oral TID BM  . levofloxacin  750 mg Oral Daily  . mouth rinse  15 mL Mouth Rinse BID  . multivitamin with minerals  1 tablet Oral Daily  . sodium chloride flush  3 mL Intravenous Once   Continuous Infusions: . vancomycin 1,250 mg (08/01/19 0607)     LOS: 7 days        Joeseph Verville Annett Gula, MD

## 2019-08-02 ENCOUNTER — Inpatient Hospital Stay (HOSPITAL_COMMUNITY): Payer: Medicaid Other

## 2019-08-02 LAB — CBC WITH DIFFERENTIAL/PLATELET
Abs Immature Granulocytes: 0.06 10*3/uL (ref 0.00–0.07)
Basophils Absolute: 0 10*3/uL (ref 0.0–0.1)
Basophils Relative: 0 %
Eosinophils Absolute: 0 10*3/uL (ref 0.0–0.5)
Eosinophils Relative: 0 %
HCT: 22 % — ABNORMAL LOW (ref 39.0–52.0)
Hemoglobin: 8 g/dL — ABNORMAL LOW (ref 13.0–17.0)
Immature Granulocytes: 1 %
Lymphocytes Relative: 9 %
Lymphs Abs: 1 10*3/uL (ref 0.7–4.0)
MCH: 32.1 pg (ref 26.0–34.0)
MCHC: 36.4 g/dL — ABNORMAL HIGH (ref 30.0–36.0)
MCV: 88.4 fL (ref 80.0–100.0)
Monocytes Absolute: 1.3 10*3/uL — ABNORMAL HIGH (ref 0.1–1.0)
Monocytes Relative: 11 %
Neutro Abs: 9.1 10*3/uL — ABNORMAL HIGH (ref 1.7–7.7)
Neutrophils Relative %: 79 %
Platelets: 232 10*3/uL (ref 150–400)
RBC: 2.49 MIL/uL — ABNORMAL LOW (ref 4.22–5.81)
RDW: 12 % (ref 11.5–15.5)
WBC: 11.5 10*3/uL — ABNORMAL HIGH (ref 4.0–10.5)
nRBC: 0 % (ref 0.0–0.2)

## 2019-08-02 LAB — BASIC METABOLIC PANEL
Anion gap: 9 (ref 5–15)
BUN: 6 mg/dL (ref 6–20)
CO2: 25 mmol/L (ref 22–32)
Calcium: 8.2 mg/dL — ABNORMAL LOW (ref 8.9–10.3)
Chloride: 96 mmol/L — ABNORMAL LOW (ref 98–111)
Creatinine, Ser: 0.65 mg/dL (ref 0.61–1.24)
GFR calc Af Amer: >60 mL/min (ref >60)
GFR calc non Af Amer: >60 mL/min (ref >60)
Glucose, Bld: 110 mg/dL — ABNORMAL HIGH (ref 70–99)
Potassium: 3.8 mmol/L (ref 3.5–5.1)
Sodium: 130 mmol/L — ABNORMAL LOW (ref 135–145)

## 2019-08-02 MED ORDER — FUROSEMIDE 10 MG/ML IJ SOLN
20.0000 mg | Freq: Once | INTRAMUSCULAR | Status: AC
  Administered 2019-08-02: 20 mg via INTRAVENOUS

## 2019-08-02 MED ORDER — LEVOFLOXACIN IN D5W 500 MG/100ML IV SOLN: 500.0000 mg | INTRAVENOUS | Status: DC

## 2019-08-02 MED ORDER — VANCOMYCIN HCL IN DEXTROSE 1-5 GM/200ML-% IV SOLN
1000.0000 mg | Freq: Two times a day (BID) | INTRAVENOUS | Status: DC
  Administered 2019-08-03 – 2019-08-05 (×5): 1000 mg via INTRAVENOUS
  Filled 2019-08-02 (×8): qty 200

## 2019-08-02 MED ORDER — METOPROLOL TARTRATE 5 MG/5ML IV SOLN
5.0000 mg | INTRAVENOUS | Status: DC | PRN
  Administered 2019-08-02 – 2019-08-08 (×5): 5 mg via INTRAVENOUS
  Filled 2019-08-02 (×6): qty 5

## 2019-08-02 MED ORDER — IPRATROPIUM-ALBUTEROL 0.5-2.5 (3) MG/3ML IN SOLN
3.0000 mL | Freq: Four times a day (QID) | RESPIRATORY_TRACT | Status: DC
  Administered 2019-08-02 – 2019-08-05 (×10): 3 mL via RESPIRATORY_TRACT
  Filled 2019-08-02 (×8): qty 3

## 2019-08-02 MED ORDER — FUROSEMIDE 10 MG/ML IJ SOLN: 40.0000 mg | Freq: Once | INTRAMUSCULAR | Status: DC

## 2019-08-02 MED ORDER — SODIUM CHLORIDE 0.9 % IV SOLN
2.0000 g | Freq: Three times a day (TID) | INTRAVENOUS | Status: AC
  Administered 2019-08-02 – 2019-08-07 (×16): 2 g via INTRAVENOUS
  Filled 2019-08-02 (×17): qty 2

## 2019-08-02 MED ORDER — SODIUM CHLORIDE 0.9 % IV SOLN
1.0000 g | Freq: Three times a day (TID) | INTRAVENOUS | Status: DC
  Administered 2019-08-02: 14:00:00 1 g via INTRAVENOUS
  Filled 2019-08-02 (×3): qty 1

## 2019-08-02 MED ORDER — FUROSEMIDE 10 MG/ML IJ SOLN
INTRAMUSCULAR | Status: AC
  Filled 2019-08-02: qty 2

## 2019-08-02 MED ORDER — ALBUTEROL SULFATE (2.5 MG/3ML) 0.083% IN NEBU: 2.5000 mg | INHALATION_SOLUTION | RESPIRATORY_TRACT | Status: DC | PRN

## 2019-08-02 NOTE — Progress Notes (Signed)
Patient developed tachycardia and tachypnea while getting out of bed, follow-up chest radiograph personally reviewed showing a dense infiltrate at the left lower lobe, with bilateral interstitial markings.  EKG 134 bpm, with normal axis, normal intervals, sinus rhythm, poor R wave progression, no ST segment T wave changes.  1.  Cancel discharge 2.  Resume antibiotic therapy with cefepime 3.  Trial of diuresis with furosemide 20 mg intravenously 4.  As needed IV metoprolol 5.  Continue close oximetry and telemetry monitoring.

## 2019-08-02 NOTE — Progress Notes (Addendum)
MD at bedside post Xray results.  Worsening infiltrates and fluid build up.  Patient notified.  New orders for IV lasix 20 mg and cefipime.  RR [facility]ja updated for MEWS 5

## 2019-08-02 NOTE — Discharge Summary (Signed)
Physician Discharge Summary  Charles Walters ZOX:[id number] DOB: [birth year] DOA: 07/15/2019  PCP: [name], NP  Admit date: 07/09/2019 Discharge date: 08/02/2019  Admitted From: Home Disposition:  Home   Recommendations for Outpatient Follow-up and new medication changes:  1. Follow up with [name] NP on 7 days 2. Patient completed antibiotic therapy while hospitalized 3. 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  4. Holding losartan and hydrochlorothiazide, follow blood pressure as an outpatient.  Home Health: yes   Equipment/Devices: home 02   Discharge Condition: stable  CODE STATUS: full  Diet recommendation: heart healthy   Brief/Interim Summary: Patient was admitted to the hospital with a working diagnosis of community-acquired pneumonia complicated by acute hypoxic respiratory failure and severe hyponatremia.   59 year old male who presented with hiccups.  He does have significant past medical history for hypertension.  He reported about 1 week of intractable hiccups associated with loss of appetite, and watery diarrhea.  Positive dyspnea on exertion.  On his initial physical examination blood pressure 156/119, heart rate 108, respiratory rate 27, oxygen saturation 98%, his lungs had coarse breath sounds bilaterally, no wheezing, heart S1-S2 present and rhythmic, abdomen was soft and nontender, no lower extremity edema. Sodium 114, potassium 3.0, chloride 80, bicarb 17, glucose 126, BUN 7, creatinine 0.77, white count 10.9, hemoglobin 12.3, hematocrit 33.1, platelets 235.  SARS COVID-19 negative.  Urinary osmolarity 507, urine sodium <10, alcohol less than 10.  His chest radiograph had bilateral bibasilar reticulonodular infiltrates, predominantly right base.  EKG 128 bpm, normal axis, normal intervals, right atrial enlargement, sinus rhythm, poor R wave progression, LVH, no ST segment T wave  changes.  Patient received hypertonic saline and intravenous antibiotics, further work-up with CT chest negative for pulmonary embolism, echocardiography with preserved LV and RV systolic function.  1.  Acute hypoxic respiratory failure due to community-acquired pneumonia (atypical/viral), bilateral.  Patient was admitted to the progressive care unit, he received supplemental oxygen per nasal cannula and intravenous antibiotic with levofloxacin.  Further work-up with CT chest was negative for pulmonary embolism, positive multifocal pneumonia. His cultures were no growth, viral panel was negative.  Patient completed his antibiotic therapy while hospitalized.  Patient had a positive oxygen desaturation on room air at discharge, he will continue home oxygen.  Patient will follow up with the outpatient pulmonary clinic, atypical features of pneumonia warrants follow-up imaging to rule out interstitial lung disease.  2.  Hypovolemic hypo-osmolar hyponatremia.  Patient received hypertonic saline with correction of serum sodium, his p.o. intake improved, intravenous fluids were discontinued. His discharge sodium is 130, potassium 3.8, chloride 96, bicarb 25, BUN 6 and creatinine 0.65.  3.  Hypertension.  His antihypertensive agents number held including diuretics.  Will continue amlodipine 5 mg daily at discharge.   4.  Acute diarrhea.  Clinically resolved, likely part of viral syndrome.  5.  Tobacco abuse.  Patient was encouraged about smoking cessation.  Discharge Diagnoses:  Principal Problem:   Community acquired bacterial pneumonia Active Problems:   Hyponatremia   Intractable hiccups   Diarrhea   Asthenia   Essential hypertension    Discharge Instructions   Allergies as of 08/02/2019   No Known Allergies     Medication List    STOP taking these medications   hydrochlorothiazide 25 MG tablet Commonly known as: HYDRODIURIL   losartan 25 MG tablet Commonly known as: COZAAR    methylPREDNISolone 4 MG Tbpk tablet Commonly  known as: MEDROL DOSEPAK   sildenafil 20 MG tablet Commonly known as: REVATIO     TAKE these medications   amLODipine 10 MG tablet Commonly known as: NORVASC Take 1 tablet (10 mg total) by mouth daily. What changed: Another medication with the same name was removed. Continue taking this medication, and follow the directions you see here.   levofloxacin 750 MG tablet Commonly known as: LEVAQUIN Take 1 tablet (750 mg total) by mouth daily for 3 days.            Durable Medical Equipment  (From admission, onward)         Start     Ordered   07/29/19 1357  For home use only DME oxygen  Once    Question Answer Comment  Length of Need 6 Months   Mode or (Route) Nasal cannula   Liters per Minute 4   Frequency Continuous (stationary and portable oxygen unit needed)   Oxygen conserving device Yes   Oxygen delivery system Gas      07/29/19 1356         Follow-up Information    Placey, Audrea Muscat, NP Follow up.   Why: please make you a follow up apt Contact information: [place] [address] [phone]        Margaretha Seeds, MD Follow up on 09/10/2019.   Specialty: Pulmonary Disease Why: Follow up with Dr. Rodman Pickle 09/10/2019 at 10:30 am  Contact information: [place] [address] [phone]          No Known Allergies  Consultations:  Nephrology   Pulmonary    Procedures/Studies: DG Chest 2 View  Result Date: 07/30/2019 CLINICAL DATA:  Dyspnea, smoker, hypertension EXAM: CHEST - 2 VIEW COMPARISON:  08/13/19 FINDINGS: Normal heart size, mediastinal contours, and pulmonary vascularity. LEFT basilar atelectasis versus infiltrate increased. RIGHT basilar infiltrate consistent with pneumonia, increased. Remaining lungs clear. No pleural effusion or pneumothorax. IMPRESSION: Increased RIGHT basilar infiltrate with additionally increased atelectasis versus  infiltrate at LEFT base. Electronically Signed   By: Lavonia Dana M.D.   On: 07/30/2019 08:04   CT CHEST WO CONTRAST  Result Date: 08-13-19 CLINICAL DATA:  59 year old male with shortness of breath. EXAM: CT CHEST WITHOUT CONTRAST TECHNIQUE: Multidetector CT imaging of the chest was performed following the standard protocol without IV contrast. COMPARISON:  Chest radiograph dated 2019/08/13. FINDINGS: Evaluation of this exam is limited in the absence of intravenous contrast. Cardiovascular: There is no cardiomegaly or pericardial effusion. Advanced 3 vessel coronary vascular calcification noted. There is hypoattenuation of the cardiac blood pool suggestive of a degree of anemia. Clinical correlation is recommended. There is mild aneurysmal dilatation of the ascending aorta measuring up to 4.2 cm in diameter. The central pulmonary arteries are grossly unremarkable on this noncontrast CT. Mediastinum/Nodes: There is no hilar or mediastinal adenopathy. The esophagus and the thyroid gland are grossly unremarkable. No mediastinal fluid collection. Lungs/Pleura: There is diffuse interstitial and interlobular septal thickening and ground-glass airspace opacity of the lung bases. Scattered areas of subpleural ground-glass density also noted. Findings concerning for an atypical infection. Clinical correlation is recommended. There is no pleural effusion or pneumothorax. The central airways are patent. Upper Abdomen: Fatty infiltration of the liver. Musculoskeletal: Degenerative changes of the spine. No acute osseous pathology. IMPRESSION: 1. Findings concerning for multifocal pneumonia, possibly viral or atypical in etiology. Clinical correlation and follow-up recommended. 2. Mild aneurysmal dilatation of the ascending aorta measuring  up to 4.2 cm in diameter. Recommend annual imaging followup by CTA or MRA. This recommendation follows 2010 ACCF/AHA/AATS/ACR/ASA/SCA/SCAI/SIR/STS/SVM Guidelines for the Diagnosis and  Management of Patients with Thoracic Aortic Disease. Circulation. 2010; 121: Z610-R604. Aortic aneurysm NOS (ICD10-I71.9) 3. Three vessel coronary vascular calcification. 4. Fatty liver. Electronically Signed   By: Elgie Collard M.D.   On: 2019/08/19 21:10   CT ANGIO CHEST PE W OR WO CONTRAST  Result Date: 07/26/2019 CLINICAL DATA:  59 year old male with shortness of breath. Concern for pulmonary embolism. EXAM: CT ANGIOGRAPHY CHEST WITH CONTRAST TECHNIQUE: Multidetector CT imaging of the chest was performed using the standard protocol during bolus administration of intravenous contrast. Multiplanar CT image reconstructions and MIPs were obtained to evaluate the vascular anatomy. CONTRAST:  80mL OMNIPAQUE IOHEXOL 350 MG/ML SOLN COMPARISON:  Chest CT dated 08/19/2019. FINDINGS: Cardiovascular: Mild dilatation of the right atrium with slight retrograde flow of contrast into the IVC suggestive of a degree of right heart dysfunction. There is 3 vessel coronary vascular calcification. No pericardial effusion. Mildly dilated ascending aorta measuring up to 4.2 cm in diameter. There is mild atherosclerotic calcification of the thoracic aorta. No dissection. Evaluation of the pulmonary arteries is somewhat limited due to respiratory motion artifact. No pulmonary artery embolus identified. Mediastinum/Nodes: There is no hilar or mediastinal adenopathy. The esophagus is grossly unremarkable. No mediastinal fluid collection. Lungs/Pleura: Bilateral lower lobe predominant as well as upper lobe subpleural ground-glass opacities with areas of interlobular septal thickening at the lung bases. This is similar to the prior CT and nonspecific but most likely represent a multifocal pneumonia, possibly atypical in etiology. Pulmonary edema or other etiologies including pulmonary alveolar proteinosis are considered less likely. Clinical correlation is recommended. There is no pleural effusion or pneumothorax. The central airways  are patent. Upper Abdomen: Fatty infiltration of the liver. Musculoskeletal: Degenerative changes of the spine. No acute osseous pathology. Review of the MIP images confirms the above findings. IMPRESSION: 1. No CT evidence of pulmonary embolism. 2. Pulmonary opacities similar to prior CT, nonspecific but most likely multifocal pneumonia of atypical etiology. Clinical correlation is recommended. 3. Mild dilatation of the ascending aorta measuring up to 4.2 cm in diameter. Follow-up as per recommendation of yesterday's CT. 4. 3 vessel coronary vascular calcification and Aortic Atherosclerosis (ICD10-I70.0). 5. Fatty liver. Electronically Signed   By: Elgie Collard M.D.   On: 07/26/2019 17:13   DG Chest Portable 1 View  Result Date: 2019-08-19 CLINICAL DATA:  Shortness of breath and chest pain EXAM: PORTABLE CHEST 1 VIEW COMPARISON:  May 04, 2015 FINDINGS: There is diffuse interstitial thickening in the lung bases. The lungs elsewhere are clear. Heart size and pulmonary vascularity are normal. No adenopathy. No bone lesions. IMPRESSION: Diffuse interstitial thickening in the lung bases. Question atypical organism pneumonitis in the lung bases versus fibrosis. Both entities may be present concurrently. Lungs otherwise clear. Cardiac silhouette within normal limits. No adenopathy. Electronically Signed   By: Bretta Bang III M.D.   On: 2019/08/19 13:57   ECHOCARDIOGRAM COMPLETE  Result Date: 07/26/2019    ECHOCARDIOGRAM REPORT   Patient Name:   Charles Walters Date of Exam: 07/26/2019 Medical Rec #:  [id number]   Height:       73.0 in Accession #:    [id number]  Weight:       142.0 lb Date of Birth:  01-21-1961   BSA:          1.86 m Patient Age:    45 years  BP:           126/89 mmHg Patient Gender: M           HR:           108 bpm. Exam Location:  Inpatient Procedure: 2D Echo Indications:    dyspnea 786.09  History:        Patient has no prior history of Echocardiogram examinations.                  Risk Factors:Hypertension and Current Smoker.  Sonographer:    Celene Skeen RDCS (AE) Referring Phys: [id number] [facility]  Sonographer Comments: Suboptimal parasternal window. IMPRESSIONS  1. Left ventricular ejection fraction, by estimation, is 60 to 65%. The left ventricle has normal function. The left ventricle has no regional wall motion abnormalities. Left ventricular diastolic parameters are consistent with Grade I diastolic dysfunction (impaired relaxation).  2. Right ventricular systolic function is low normal. The right ventricular size is normal. There is moderately elevated pulmonary artery systolic pressure. The estimated right ventricular systolic pressure is 40.9 mmHg.  3. The mitral valve is normal in structure and function. No evidence of mitral valve regurgitation. No evidence of mitral stenosis.  4. Tricuspid valve regurgitation is mild to moderate.  5. The aortic valve is tricuspid. Aortic valve regurgitation is not visualized. No aortic stenosis is present.  6. Aortic dilatation noted. There is mild dilatation of the ascending aorta measuring 43 mm.  7. The inferior vena cava is normal in size with greater than 50% respiratory variability, suggesting right atrial pressure of 3 mmHg. FINDINGS  Left Ventricle: Left ventricular ejection fraction, by estimation, is 60 to 65%. The left ventricle has normal function. The left ventricle has no regional wall motion abnormalities. The left ventricular internal cavity size was normal in size. There is  no left ventricular hypertrophy. Left ventricular diastolic parameters are consistent with Grade I diastolic dysfunction (impaired relaxation). Right Ventricle: The right ventricular size is normal. No increase in right ventricular wall thickness. Right ventricular systolic function is low normal. There is moderately elevated pulmonary artery systolic pressure. The tricuspid regurgitant velocity  is 3.08 m/s, and with an assumed right atrial  pressure of 3 mmHg, the estimated right ventricular systolic pressure is 40.9 mmHg. Left Atrium: Left atrial size was normal in size. Right Atrium: Right atrial size was normal in size. Pericardium: Trivial pericardial effusion is present. Mitral Valve: The mitral valve is normal in structure and function. Mild mitral annular calcification. No evidence of mitral valve regurgitation. No evidence of mitral valve stenosis. Tricuspid Valve: The tricuspid valve is normal in structure. Tricuspid valve regurgitation is mild to moderate. Aortic Valve: The aortic valve is tricuspid. Aortic valve regurgitation is not visualized. No aortic stenosis is present. Pulmonic Valve: The pulmonic valve was not well visualized. Pulmonic valve regurgitation is not visualized. Aorta: Aortic dilatation noted. There is mild dilatation of the ascending aorta measuring 43 mm. Venous: The inferior vena cava is normal in size with greater than 50% respiratory variability, suggesting right atrial pressure of 3 mmHg. IAS/Shunts: No atrial level shunt detected by color flow Doppler.  LEFT VENTRICLE PLAX 2D LVIDd:         4.00 cm Diastology LVIDs:         2.60 cm LV e' lateral:   11.20 cm/s LV PW:         1.10 cm LV E/e' lateral: 6.2 LV IVS:        1.00 cm LV  e' medial:    6.09 cm/s LV SV Index:   25.13   LV E/e' medial:  11.4  LEFT ATRIUM         Index LA diam:    2.70 cm 1.45 cm/m  AORTIC VALVE LVOT Vmax:   75.10 cm/s LVOT Vmean:  61.100 cm/s LVOT VTI:    0.156 m  AORTA Ao Root diam: 3.10 cm MITRAL VALVE               TRICUSPID VALVE MV Area (PHT): 2.50 cm    TR Peak grad:   37.9 mmHg MV Decel Time: 304 msec    TR Vmax:        308.00 cm/s MV E velocity: 69.40 cm/s MV A velocity: 76.70 cm/s  SHUNTS MV E/A ratio:  0.90        Systemic VTI: 0.16 m Marca Ancona MD Electronically signed by Marca Ancona MD Signature Date/Time: 07/26/2019/3:56:46 PM    Final       Subjective: Patient is feeling better dyspnea is improving, no chest pain or  cough, no nausea or vomiting, no chest pain or further diarrhea.   Discharge Exam: Vitals:   08/02/19 0424 08/02/19 0739  BP:    Pulse:    Resp:    Temp: 98.5 F (36.9 C) 98.8 F (37.1 C)  SpO2:     Vitals:   08/01/19 2000 08/01/19 2331 08/02/19 0424 08/02/19 0739  BP: (!) 144/98 (!) 152/92    Pulse:  (!) 117    Resp:  19    Temp: 99.8 F (37.7 C) (!) 100.6 F (38.1 C) 98.5 F (36.9 C) 98.8 F (37.1 C)  TempSrc: Oral Oral Oral Oral  SpO2:  92%    Weight:      Height:        General: Not in pain or dyspnea Neurology: Awake and alert, non focal  E ENT: no pallor, no icterus, oral mucosa moist Cardiovascular: No JVD. S1-S2 present, rhythmic, no gallops, rubs, or murmurs. No lower extremity edema. Pulmonary: positive breath sounds bilaterally, adequate air movement, no wheezing, rhonchi or rales. Gastrointestinal. Abdomen with no organomegaly, non tender, no rebound or guarding Skin. No rashes Musculoskeletal: no joint deformities   The results of significant diagnostics from this hospitalization (including imaging, microbiology, ancillary and laboratory) are listed below for reference.     Microbiology: Recent Results (from the past 240 hour(s))  SARS CORONAVIRUS 2 (TAT 6-24 HRS) Nasopharyngeal Nasopharyngeal Swab     Status: None   Collection Time: 07/30/2019  2:32 PM   Specimen: Nasopharyngeal Swab  Result Value Ref Range Status   SARS Coronavirus 2 NEGATIVE NEGATIVE Final    Comment: (NOTE) SARS-CoV-2 target nucleic acids are NOT DETECTED. The SARS-CoV-2 RNA is generally detectable in upper and lower respiratory specimens during the acute phase of infection. Negative results do not preclude SARS-CoV-2 infection, do not rule out co-infections with other pathogens, and should not be used as the sole basis for treatment or other patient management decisions. Negative results must be combined with clinical observations, patient history, and epidemiological  information. The expected result is Negative. Fact Sheet for Patients: HairSlick.no Fact Sheet for Healthcare Providers: quierodirigir.com This test is not yet approved or cleared by the [place] FDA and  has been authorized for detection and/or diagnosis of SARS-CoV-2 by FDA under an Emergency Use Authorization (EUA). This EUA will remain  in effect (meaning this test can be used) for the duration of the COVID-19 declaration  under Section 56 4(b)(1) of the Act, 21 U.S.C. section 360bbb-3(b)(1), unless the authorization is terminated or revoked sooner. Performed at [facility], [address] [place], [place] [address]   MRSA PCR Screening     Status: None   Collection Time: 07/26/19 12:05 AM   Specimen: Nasal Mucosa; Nasopharyngeal  Result Value Ref Range Status   MRSA by PCR NEGATIVE NEGATIVE Final    Comment:        The GeneXpert MRSA Assay (FDA approved for NASAL specimens only), is one component of a comprehensive MRSA colonization surveillance program. It is not intended to diagnose MRSA infection nor to guide or monitor treatment for MRSA infections. Performed at [facility], [address] [place], [place] [address]   Respiratory Panel by PCR     Status: None   Collection Time: 07/26/19  2:12 PM   Specimen: Nasopharyngeal Swab; Respiratory  Result Value Ref Range Status   Adenovirus NOT DETECTED NOT DETECTED Final   Coronavirus 229E NOT DETECTED NOT DETECTED Final    Comment: (NOTE) The Coronavirus on the Respiratory Panel, DOES NOT test for the novel  Coronavirus (2019 nCoV)    Coronavirus HKU1 NOT DETECTED NOT DETECTED Final   Coronavirus NL63 NOT DETECTED NOT DETECTED Final   Coronavirus OC43 NOT DETECTED NOT DETECTED Final   Metapneumovirus NOT DETECTED NOT DETECTED Final   Rhinovirus / Enterovirus NOT DETECTED NOT DETECTED Final   Influenza A NOT DETECTED NOT DETECTED Final    Influenza B NOT DETECTED NOT DETECTED Final   Parainfluenza Virus 1 NOT DETECTED NOT DETECTED Final   Parainfluenza Virus 2 NOT DETECTED NOT DETECTED Final   Parainfluenza Virus 3 NOT DETECTED NOT DETECTED Final   Parainfluenza Virus 4 NOT DETECTED NOT DETECTED Final   Respiratory Syncytial Virus NOT DETECTED NOT DETECTED Final   Bordetella pertussis NOT DETECTED NOT DETECTED Final   Chlamydophila pneumoniae NOT DETECTED NOT DETECTED Final   Mycoplasma pneumoniae NOT DETECTED NOT DETECTED Final    Comment: Performed at [facility], [address] [place], [place] [address]  Culture, blood (Routine X 2) w Reflex to ID Panel     Status: None (Preliminary result)   Collection Time: 07/30/19  6:00 PM   Specimen: BLOOD  Result Value Ref Range Status   Specimen Description BLOOD LEFT ANTECUBITAL  Final   Special Requests   Final    BOTTLES DRAWN AEROBIC AND ANAEROBIC Blood Culture adequate volume   Culture   Final    NO GROWTH 2 DAYS Performed at [facility], [address] [place], [place] [address]    Report Status PENDING  Incomplete  Culture, blood (Routine X 2) w Reflex to ID Panel     Status: None (Preliminary result)   Collection Time: 07/30/19  6:07 PM   Specimen: BLOOD LEFT HAND  Result Value Ref Range Status   Specimen Description BLOOD LEFT HAND  Final   Special Requests   Final    BOTTLES DRAWN AEROBIC AND ANAEROBIC Blood Culture adequate volume   Culture   Final    NO GROWTH 2 DAYS Performed at [facility], [address] [place], [place] [address]    Report Status PENDING  Incomplete     Labs: BNP (last 3 results) No results for input(s): BNP in the last 8760 hours. Basic Metabolic Panel: Recent Labs  Lab 07/27/19 [id number] 07/27/19 1241 07/28/19 [id number] 07/28/19 [id number] 07/29/19 0201 07/29/19 [id number] 07/31/19 [id number]  08/01/19 0232 08/02/19 0216  NA 124*   < > 126*   < > 126* 123*  126* 128* 130* 130*  K 3.5   < > 3.6  --   --  3.6 4.0 3.5 3.8  CL  95*   < > 96*  --   --  96* 95* 96* 96*  CO2 19*   < > 20*  --   --  21* 27 25 25   GLUCOSE 127*   < > 108*  --   --  99 118* 139* 110*  BUN 10   < > 9  --   --  6 5* 7 6  CREATININE 0.67   < > 0.64  --   --  0.55* 0.61 0.61 0.65  CALCIUM 8.0*   < > 8.1*  --   --  7.9* 8.3* 8.3* 8.2*  MG 1.9  --  1.8  --   --  1.6* 1.6* 1.7  --    < > = values in this interval not displayed.   Liver Function Tests: Recent Labs  Lab 07/27/19 0826 07/28/19 0616 07/29/19 0625 07/31/19 0837 08/01/19 0232  AST 29 32 40 47* 45*  ALT 22 24 33 42 43  ALKPHOS 49 52 54 58 56  BILITOT 1.3* 0.7 0.4 0.8 0.6  PROT 6.0* 5.9* 5.5* 6.2* 5.9*  ALBUMIN 1.8* 1.8* 1.6* 1.7* 1.6*   No results for input(s): LIPASE, AMYLASE in the last 168 hours. No results for input(s): AMMONIA in the last 168 hours. CBC: Recent Labs  Lab 07/28/19 0616 07/29/19 0625 07/31/19 0837 08/01/19 0232 08/02/19 0216  WBC 8.6 9.8 10.3 11.4* 11.5*  NEUTROABS 6.6 7.9* 8.5* 9.2* 9.1*  HGB 9.0* 8.8* 9.1* 8.0* 8.0*  HCT 24.9* 24.1* 25.8* 22.2* 22.0*  MCV 88.6 88.3 90.2 88.4 88.4  PLT 158 175 206 215 232   Cardiac Enzymes: No results for input(s): CKTOTAL, CKMB, CKMBINDEX, TROPONINI in the last 168 hours. BNP: Invalid input(s): POCBNP CBG: No results for input(s): GLUCAP in the last 168 hours. D-Dimer No results for input(s): DDIMER in the last 72 hours. Hgb A1c No results for input(s): HGBA1C in the last 72 hours. Lipid Profile No results for input(s): CHOL, HDL, LDLCALC, TRIG, CHOLHDL, LDLDIRECT in the last 72 hours. Thyroid function studies No results for input(s): TSH, T4TOTAL, T3FREE, THYROIDAB in the last 72 hours.  Invalid input(s): FREET3 Anemia work up No results for input(s): VITAMINB12, FOLATE, FERRITIN, TIBC, IRON, RETICCTPCT in the last 72 hours. Urinalysis No results found for: COLORURINE, APPEARANCEUR, LABSPEC, PHURINE, GLUCOSEU, HGBUR, BILIRUBINUR, KETONESUR, PROTEINUR, UROBILINOGEN, NITRITE, LEUKOCYTESUR Sepsis  Labs Invalid input(s): PROCALCITONIN,  WBC,  LACTICIDVEN Microbiology Recent Results (from the past 240 hour(s))  SARS CORONAVIRUS 2 (TAT 6-24 HRS) Nasopharyngeal Nasopharyngeal Swab     Status: None   Collection Time: 07/13/2019  2:32 PM   Specimen: Nasopharyngeal Swab  Result Value Ref Range Status   SARS Coronavirus 2 NEGATIVE NEGATIVE Final    Comment: (NOTE) SARS-CoV-2 target nucleic acids are NOT DETECTED. The SARS-CoV-2 RNA is generally detectable in upper and lower respiratory specimens during the acute phase of infection. Negative results do not preclude SARS-CoV-2 infection, do not rule out co-infections with other pathogens, and should not be used as the sole basis for treatment or other patient management decisions. Negative results must be combined with clinical observations, patient history, and epidemiological information. The expected result is Negative. Fact Sheet for Patients: 07/27/19 Fact Sheet for Healthcare Providers: HairSlick.no This  test is not yet approved or cleared by the [place] and  has been authorized for detection and/or diagnosis of SARS-CoV-2 by FDA under an Emergency Use Authorization (EUA). This EUA will remain  in effect (meaning this test can be used) for the duration of the COVID-19 declaration under Section 56 4(b)(1) of the Act, 21 U.S.C. section 360bbb-3(b)(1), unless the authorization is terminated or revoked sooner. Performed at [facility], [address], [place], [place] [address]   MRSA PCR Screening     Status: None   Collection Time: 07/26/19 12:05 AM   Specimen: Nasal Mucosa; Nasopharyngeal  Result Value Ref Range Status   MRSA by PCR NEGATIVE NEGATIVE Final    Comment:        The GeneXpert MRSA Assay (FDA approved for NASAL specimens only), is one component of a comprehensive MRSA colonization surveillance program. It is not intended to diagnose  MRSA infection nor to guide or monitor treatment for MRSA infections. Performed at [facility], [address] [place], [place] [address]   Respiratory Panel by PCR     Status: None   Collection Time: 07/26/19  2:12 PM   Specimen: Nasopharyngeal Swab; Respiratory  Result Value Ref Range Status   Adenovirus NOT DETECTED NOT DETECTED Final   Coronavirus 229E NOT DETECTED NOT DETECTED Final    Comment: (NOTE) The Coronavirus on the Respiratory Panel, DOES NOT test for the novel  Coronavirus (2019 nCoV)    Coronavirus HKU1 NOT DETECTED NOT DETECTED Final   Coronavirus NL63 NOT DETECTED NOT DETECTED Final   Coronavirus OC43 NOT DETECTED NOT DETECTED Final   Metapneumovirus NOT DETECTED NOT DETECTED Final   Rhinovirus / Enterovirus NOT DETECTED NOT DETECTED Final   Influenza A NOT DETECTED NOT DETECTED Final   Influenza B NOT DETECTED NOT DETECTED Final   Parainfluenza Virus 1 NOT DETECTED NOT DETECTED Final   Parainfluenza Virus 2 NOT DETECTED NOT DETECTED Final   Parainfluenza Virus 3 NOT DETECTED NOT DETECTED Final   Parainfluenza Virus 4 NOT DETECTED NOT DETECTED Final   Respiratory Syncytial Virus NOT DETECTED NOT DETECTED Final   Bordetella pertussis NOT DETECTED NOT DETECTED Final   Chlamydophila pneumoniae NOT DETECTED NOT DETECTED Final   Mycoplasma pneumoniae NOT DETECTED NOT DETECTED Final    Comment: Performed at [facility], [address], [place], [place] [address]  Culture, blood (Routine X 2) w Reflex to ID Panel     Status: None (Preliminary result)   Collection Time: 07/30/19  6:00 PM   Specimen: BLOOD  Result Value Ref Range Status   Specimen Description BLOOD LEFT ANTECUBITAL  Final   Special Requests   Final    BOTTLES DRAWN AEROBIC AND ANAEROBIC Blood Culture adequate volume   Culture   Final    NO GROWTH 2 DAYS Performed at [facility], [address] [place], [place] [address]    Report Status PENDING  Incomplete  Culture, blood  (Routine X 2) w Reflex to ID Panel     Status: None (Preliminary result)   Collection Time: 07/30/19  6:07 PM   Specimen: BLOOD LEFT HAND  Result Value Ref Range Status   Specimen Description BLOOD LEFT HAND  Final   Special Requests   Final    BOTTLES DRAWN AEROBIC AND ANAEROBIC Blood Culture adequate volume   Culture   Final    NO GROWTH 2 DAYS Performed at [facility], [address] [place], [place] [address]    Report Status PENDING  Incomplete     Time coordinating discharge: 45 minutes  SIGNED:   Coralie KeensMauricio Daniel Calen Posch, MD  [facility] 08/02/2019, 8:10 AM

## 2019-08-02 NOTE — Progress Notes (Signed)
[id number]   Nurse tech reports patient with elevated temp of 101.2, patient had removed O2 monitor, reapplied, sats 86-90, Tylenol 650 mg given, [place] increased to 4 liters.  MD arrived on floor as RN sending him a message.  See new orders.  RR Puja notified

## 2019-08-02 NOTE — TOC Transition Note (Signed)
Transition of Care [facility]) - CM/SW Discharge Note   Patient Details  Name: Charles Walters MRN: [id number] Date of Birth: 02-18-1961  Transition of Care [facility]) CM/SW Contact:  Leone Haven, RN Phone Number: 08/02/2019, 12:21 PM   Clinical Narrative:    Patient is set up with [facility] for HHPT for charity,  He is also set up with [employer] for home oxygen for charity.  Britta Mccreedy Staff RN states patient heart rate is up to 135 today , so he will not be dc today.    Final next level of care: Home w Home Health Services Barriers to Discharge: Continued Medical Work up   Patient Goals and CMS Choice   CMS Medicare.gov Compare Post Acute Care list provided to:: Patient Choice offered to / list presented to : Patient  Discharge Placement                       Discharge Plan and Services                DME Arranged: Oxygen DME Agency: [employer] Date DME Agency Contacted: 07/29/19 Time DME Agency Contacted: [phone] Representative spoke with at DME Agency: Fayrene Fearing HH Arranged: PT HH Agency: [facility] Date HH Agency Contacted: 08/02/19 Time HH Agency Contacted: 1221 Representative spoke with at [facility] Agency: Tiffany  Social Determinants of Health (SDOH) Interventions     Readmission Risk Interventions No flowsheet data found.

## 2019-08-02 NOTE — Progress Notes (Signed)
Pharmacy Antibiotic Note  Charles Walters is a 59 y.o. male with recent CAP and was on cefepime then levaquin (last dose of levaquin was 2/24; total duration of about 7 days antibiotics). Pharmacy consulted to restart cefepime with increased work of breathing -WBC= 11.7, tmax= 100.6, CrCl ~ 100  Plan -Cefepime 1gm IV q8h -Will follow renal function, cultures and clinical progress     Height: 6\' 1"  (185.4 cm) Weight: 141 lb 15.6 oz (64.4 kg)(Shoes + telemetry box removed at time of weighing) IBW/kg (Calculated) : 79.9  Temp (24hrs), Avg:99 F (37.2 C), Min:98.3 F (36.8 C), Max:100.6 F (38.1 C)  Recent Labs  Lab 07/28/19 0616 07/29/19 0625 07/31/19 0837 08/01/19 0232 08/02/19 0216  WBC 8.6 9.8 10.3 11.4* 11.5*  CREATININE 0.64 0.55* 0.61 0.61 0.65    Estimated Creatinine Clearance: 92.8 mL/min (by C-G formula based on SCr of 0.65 mg/dL).    No Known Allergies   08/04/19, PharmD Clinical Pharmacist **Pharmacist phone directory can now be found on [email or web address] ([email or web address]).  Listed under [facility].

## 2019-08-02 NOTE — Progress Notes (Addendum)
Into patient room for noon assessment.  Patient heart rate 135-140, respiratory rate 38 with patient sitting in chair snoozing.  Previously, patient heart rate only elevated when standing at bedside for urinating and moving around in room and returns to to his baseline 110-115.  Review demonstrates patient with consistent heart rate of 130 without returning to 110s.  Patient respiratory rate 38 during sleep.  Respiratory rate elevated since admission due to pneumonia/smoker.  Dr Ella Jubilee notified.  Orders for EKG, Lopressor 5 mg IV, cancel discharge, portable chest Xray.  Patient with [place] at 2 liters.  RR Puja notified of patient status due to MEWS 5 but told not needed on the floor

## 2019-08-02 NOTE — Progress Notes (Signed)
Nutrition Follow up  DOCUMENTATION CODES:   Not applicable  INTERVENTION:    Ensure Enlive po TID, each supplement provides 350 kcal and 20 grams of protein  MVI daily   NUTRITION DIAGNOSIS:   Increased nutrient needs related to acute illness as evidenced by estimated needs.  Ongoing  GOAL:   Patient will meet greater than or equal to 90% of their needs   Progressing  MONITOR:   PO intake, Supplement acceptance, Weight trends, Labs, I & O's  REASON FOR ASSESSMENT:   Malnutrition Screening Tool    ASSESSMENT:   Patient with PMH significant for HTN. Presents this admission with hyponatremia.   RD working remotely.  Pt's intake progressing. Meal completions charted as 60-100% for his last 6 meals. Drinking Ensure 1-2 times daily. Encouraged PO intake. Originally, scheduled to d/c home today. Rapid response called. D/C canceled. Continue with current supplements.   Admission weight: 79.4 kg  Current weight: 64.4 kg   I/O: +3,996 ml since admit UOP: 1,850 mlx 24 hrs   Medications: MVI with minerals Labs: Na 130 (L)   Diet Order:   Diet Order            Diet - low sodium heart healthy        Diet regular Room service appropriate? Yes; Fluid consistency: Thin  Diet effective now              EDUCATION NEEDS:   Not appropriate for education at this time  Skin:  Skin Assessment: Reviewed RN Assessment  Last BM:  2/23  Height:   Ht Readings from Last 1 Encounters:  07/26/19 6\' 1"  (1.854 m)    Weight:   Wt Readings from Last 1 Encounters:  07/26/19 64.4 kg    Ideal Body Weight:  83.6 kg  BMI:  Body mass index is 18.73 kg/m.  Estimated Nutritional Needs:   Kcal:  2000-2200 kcal  Protein:  100-120 grams  Fluid:  >/= 2 L/day   07/28/19 RD, LDN Clinical Nutrition Pager listed in AMION

## 2019-08-02 NOTE — Progress Notes (Signed)
Pharmacy Antibiotic Note  Charles Walters is a 59 y.o. male with recent CAP and was on cefepime then levaquin (last dose of levaquin was 2/24; total duration of about 7 days antibiotics). Pharmacy consulted to restart cefepime with increased work of breathing and now to add back vancomycin (previously receiving 2/18-2/19 and 2/22-2/24). SCr stable at 0.65.   Plan Cefepime to 2g IV q8h Resume vancomycin 1g IV Q 12 hrs. Goal AUC 400-550. Expected AUC: 530 SCr used: 0.8 Monitor clinical progress, c/s, renal function F/u de-escalation plan/LOT, vancomycin levels as indicated   Height: 6\' 1"  (185.4 cm) Weight: 141 lb 15.6 oz (64.4 kg)(Shoes + telemetry box removed at time of weighing) IBW/kg (Calculated) : 79.9  Temp (24hrs), Avg:99.4 F (37.4 C), Min:98.3 F (36.8 C), Max:101.2 F (38.4 C)  Recent Labs  Lab 07/28/19 0616 07/29/19 0625 07/31/19 0837 08/01/19 0232 08/02/19 0216  WBC 8.6 9.8 10.3 11.4* 11.5*  CREATININE 0.64 0.55* 0.61 0.61 0.65    Estimated Creatinine Clearance: 92.8 mL/min (by C-G formula based on SCr of 0.65 mg/dL).    No Known Allergies   08/04/19, PharmD, BCPS Please check AMION for all [facility] contact numbers Clinical Pharmacist 08/02/2019 5:01 PM

## 2019-08-02 NOTE — Significant Event (Addendum)
Rapid Response Event Note  Overview: MEWS   Nurse now calls to report that patient now has a temperature, per nurse, has PNA on CXR, and MD is aware - Nurse administered 650 mg APAP as well.  Per nurse, she DID NOT need me to see the patient. Also the increased RR and HR are NOT new changes. The temperature was and it was being treated.   NO RRT INTERVENTIONS   Layla Kesling R

## 2019-08-02 NOTE — Progress Notes (Signed)
PT Cancellation Note  Patient Details Name: Charles Walters MRN: [id number] DOB: 1961-03-28   Cancelled Treatment:    Reason Eval/Treat Not Completed: Patient declined, no reason specified Pt declined participating in therapy at this time due to having loose stools this am. Offered to get incontinence underwear for pt however he declines mobilizing and reports being eager to get home. PT will continue to follow acutely.    Erline Levine, PTA Acute Rehabilitation Services Pager: [phone] Office: [phone]   08/02/2019, 11:20 AM

## 2019-08-02 NOTE — Significant Event (Addendum)
Rapid Response Event Note  Overview: MEWS - RED  Nurse called to report MEWS score, per nurse, MD is aware. I was called at 1300.   Charles Walters

## 2019-08-02 NOTE — TOC Transition Note (Addendum)
Transition of Care [facility]) - CM/SW Discharge Note   Patient Details  Name: Mingo Siegert MRN: [id number] Date of Birth: 1960-08-21  Transition of Care [facility]) CM/SW Contact:  Leone Haven, RN Phone Number: 08/02/2019, 9:08 AM   Clinical Narrative:    Patient is for dc today, NCM spoke with patient , informed him that pt eval is rec HHPT for him, he states he does not want HHPT, and he does not need a shower chair.  He has home oxygen tanks in his room to go home with and will have home oxygen set up for him.  He states he will call himself a cab when he goes home.  He will call [facility] with the [facility] so she can help get his medications for him.  NCM informed him to let us know if he needs anything else prior to dc. Patient has changed his mind and would like HHPT, NCM made referral to Tiffany with [facility] for charity. Awaiting call back. Tiffany with [facility] is able to take referral for HHPT for charity.    Final next level of care: Home/Self Care Barriers to Discharge: No Barriers Identified   Patient Goals and CMS Choice     Choice offered to / list presented to : NA  Discharge Placement                       Discharge Plan and Services                DME Arranged: Oxygen DME Agency: [employer] Date DME Agency Contacted: 07/29/19 Time DME Agency Contacted: 25 Representative spoke with at DME Agency: Fayrene Fearing            Social Determinants of Health (SDOH) Interventions     Readmission Risk Interventions No flowsheet data found.

## 2019-08-03 LAB — CBC WITH DIFFERENTIAL/PLATELET
Abs Immature Granulocytes: 0.09 10*3/uL — ABNORMAL HIGH (ref 0.00–0.07)
Basophils Absolute: 0 10*3/uL (ref 0.0–0.1)
Basophils Relative: 0 %
Eosinophils Absolute: 0 10*3/uL (ref 0.0–0.5)
Eosinophils Relative: 0 %
HCT: 23.1 % — ABNORMAL LOW (ref 39.0–52.0)
Hemoglobin: 8.1 g/dL — ABNORMAL LOW (ref 13.0–17.0)
Immature Granulocytes: 1 %
Lymphocytes Relative: 7 %
Lymphs Abs: 0.9 10*3/uL (ref 0.7–4.0)
MCH: 31.4 pg (ref 26.0–34.0)
MCHC: 35.1 g/dL (ref 30.0–36.0)
MCV: 89.5 fL (ref 80.0–100.0)
Monocytes Absolute: 1.3 10*3/uL — ABNORMAL HIGH (ref 0.1–1.0)
Monocytes Relative: 10 %
Neutro Abs: 10.6 10*3/uL — ABNORMAL HIGH (ref 1.7–7.7)
Neutrophils Relative %: 82 %
Platelets: 276 10*3/uL (ref 150–400)
RBC: 2.58 MIL/uL — ABNORMAL LOW (ref 4.22–5.81)
RDW: 12.3 % (ref 11.5–15.5)
WBC: 12.9 10*3/uL — ABNORMAL HIGH (ref 4.0–10.5)
nRBC: 0 % (ref 0.0–0.2)

## 2019-08-03 LAB — HYPERSENSITIVITY PNEUMONITIS
A. Pullulans Abs: NEGATIVE
A.Fumigatus #1 Abs: NEGATIVE
Micropolyspora faeni, IgG: NEGATIVE
Pigeon Serum Abs: NEGATIVE
Thermoact. Saccharii: NEGATIVE
Thermoactinomyces vulgaris, IgG: NEGATIVE

## 2019-08-03 LAB — BASIC METABOLIC PANEL
Anion gap: 11 (ref 5–15)
BUN: 8 mg/dL (ref 6–20)
CO2: 25 mmol/L (ref 22–32)
Calcium: 8.3 mg/dL — ABNORMAL LOW (ref 8.9–10.3)
Chloride: 94 mmol/L — ABNORMAL LOW (ref 98–111)
Creatinine, Ser: 0.72 mg/dL (ref 0.61–1.24)
GFR calc Af Amer: >60 mL/min (ref >60)
GFR calc non Af Amer: >60 mL/min (ref >60)
Glucose, Bld: 98 mg/dL (ref 70–99)
Potassium: 3.6 mmol/L (ref 3.5–5.1)
Sodium: 130 mmol/L — ABNORMAL LOW (ref 135–145)

## 2019-08-03 LAB — MRSA PCR SCREENING

## 2019-08-03 MED ORDER — METOPROLOL TARTRATE 25 MG PO TABS
25.0000 mg | ORAL_TABLET | Freq: Two times a day (BID) | ORAL | Status: DC
  Administered 2019-08-03 – 2019-08-06 (×7): 25 mg via ORAL
  Filled 2019-08-03 (×8): qty 1

## 2019-08-03 MED ORDER — NICOTINE 21 MG/24HR TD PT24
21.0000 mg | MEDICATED_PATCH | Freq: Every day | TRANSDERMAL | Status: DC
  Administered 2019-08-04 – 2019-08-08 (×5): 21 mg via TRANSDERMAL
  Filled 2019-08-03 (×5): qty 1

## 2019-08-03 NOTE — Progress Notes (Signed)
Rehab Admissions Coordinator Note:  Patient was screened by Clois Dupes for appropriateness for an Inpatient Acute Rehab Consult per therapy recs.   At this time, we are recommending Inpatient Rehab consult. I will place order per protocol.  Clois Dupes RN MSN 08/03/2019, 5:58 PM  I can be reached at [phone].

## 2019-08-03 NOTE — Progress Notes (Signed)
Patient continues with elevated heart rate though decreased from yesterday.  Sleeping rate 110-115 respiratory rate 28-34, unlabored on 5 liters temp 99.4.  Dr Ella Jubilee on the unit and updated, Rapid response updated as well

## 2019-08-03 NOTE — Progress Notes (Signed)
Occupational Therapy Treatment Patient Details Name: Charles Walters MRN: [id number] DOB: 07/04/1960 Today's Date: 08/03/2019    History of present illness This 59 y.o. male admitted with intractable hiccups, loss of appetite, watery diarrhea, and DOE. Dx: acute respiratory failure with hypoxia, multifocal PNA, hyponatremia.  PMH includes: HTN   OT comments  Pt seen for OT f/u with decreased cardiopulmonary status this date. Pt started session on 3L [place], impulsively got up to go to bathroom, pushing IV pole and removing supplemental O2 without much awareness. Once on toilet, pt satting around 72 SpO2 with RR in the 50s. Supplemental O2 returned and pt needing increased recovery time. O2 ultimately needing to be increased to 10L to maintain high 80% with mobility out of bathroom. During this time, pt with poor awareness and impulsively moving resulting IV to pull out. RN in room to address. Pt then engaged in LB dressing with min A to manage clean up of blood from IV. During this time, he was largely unaware of how this situation could have been dangerous. He consistently states he feels "fine" despite circumstances around him. Pt needing ~10 minutes recovery time to return to 90% in recliner. RN in room addressing at end of session. Updated recommendations for CIR to progress functional mobility, safety, and BADL tolerance prior to returning to home. Will continue to follow.    Follow Up Recommendations  CIR    Equipment Recommendations  Tub/shower seat    Recommendations for Other Services Rehab consult    Precautions / Restrictions Precautions Precautions: Fall Precaution Comments: watch sats Restrictions Weight Bearing Restrictions: No       Mobility Bed Mobility Overal bed mobility: Independent                Transfers Overall transfer level: Needs assistance Equipment used: None Transfers: Sit to/from Stand Sit to Stand: Supervision;Min guard         General transfer  comment: needing increased assist for safety, poor line management and awareness of cardiopulm deficits    Balance Overall balance assessment: Needs assistance Sitting-balance support: No upper extremity supported;Feet supported Sitting balance-Leahy Scale: Good     Standing balance support: No upper extremity supported;During functional activity Standing balance-Leahy Scale: Fair                             ADL either performed or assessed with clinical judgement   ADL Overall ADL's : Needs assistance/impaired                     Lower Body Dressing: Minimal assistance;Sitting/lateral leans Lower Body Dressing Details (indicate cue type and reason): to don socks Toilet Transfer: Minimal assistance;Ambulation;Comfort height toilet;Grab bars;Cueing for safety;Cueing for sequencing Toilet Transfer Details (indicate cue type and reason): very impulsive Toileting- Clothing Manipulation and Hygiene: Min guard;Sit to/from stand       Functional mobility during ADLs: Min guard;Minimal assistance;Cueing for safety;Cueing for sequencing General ADL Comments: pt very impulsive and de satting without awareness this date     Vision Patient Visual Report: No change from baseline     Perception     Praxis      Cognition Arousal/Alertness: Awake/alert Behavior During Therapy: WFL for tasks assessed/performed;Impulsive Overall Cognitive Status: Within Functional Limits for tasks assessed Area of Impairment: Safety/judgement                         Safety/Judgement: Decreased  awareness of safety;Decreased awareness of deficits     General Comments: decreased safety awareness to go to bathroom impulsively without O2, unaware of deficits despite cueing        Exercises     Shoulder Instructions       General Comments Pt on 3L O2 via [place] upon arrival; pt doffed O2 and impulsively began going to bathroom and SpO2 desat to 72%; up to 10 L O2 via [place] and  PLB required to increase SpO2 into 80s; pt on 5L O2 end of session at 90% ; pt's iv pulled out when standing up from commode and RN came to assist in stopping bleeding from iv site    Pertinent Vitals/ Pain       Pain Assessment: No/denies pain  Home Living                                          Prior Functioning/Environment              Frequency  Min 2X/week        Progress Toward Goals  OT Goals(current goals can now be found in the care plan section)  Progress towards OT goals: Not progressing toward goals - comment;Progressing toward goals  Acute Rehab OT Goals Patient Stated Goal: get my strength back  OT Goal Formulation: With patient Time For Goal Achievement: 08/12/19 Potential to Achieve Goals: Good  Plan Discharge plan needs to be updated    Co-evaluation    PT/OT/SLP Co-Evaluation/Treatment: Yes Reason for Co-Treatment: For patient/therapist safety PT goals addressed during session: Mobility/safety with mobility OT goals addressed during session: ADL's and self-care;Strengthening/ROM;Proper use of [employer] equipment and DME      AM-PAC OT "6 Clicks" Daily Activity     Outcome Measure   Help from another person eating meals?: None Help from another person taking care of personal grooming?: A Little Help from another person toileting, which includes using toliet, bedpan, or urinal?: A Little Help from another person bathing (including washing, rinsing, drying)?: A Little Help from another person to put on and taking off regular upper body clothing?: A Little Help from another person to put on and taking off regular lower body clothing?: A Little 6 Click Score: 19    End of Session Equipment Utilized During Treatment: Oxygen  OT Visit Diagnosis: Unsteadiness on feet (R26.81);Other abnormalities of gait and mobility (R26.89)   Activity Tolerance Patient limited by fatigue   Patient Left in chair;with call bell/phone within  reach   Nurse Communication Mobility status        Time: 1696-7893 OT Time Calculation (min): 38 min  Charges: OT General Charges $OT Visit: 1 Visit  Zenovia Jarred, MSOT, OTR/L [facility] Office Number: [phone] Pager: [phone]   Zenovia Jarred 08/03/2019, 5:07 PM

## 2019-08-03 NOTE — Progress Notes (Signed)
Physical Therapy Treatment Patient Details Name: Charles Walters MRN: [id number] DOB: 07-Oct-1960 Today's Date: 08/03/2019    History of Present Illness This 59 y.o. male admitted with intractable hiccups, loss of appetite, watery diarrhea, and DOE. Dx: acute respiratory failure with hypoxia, multifocal PNA, hyponatremia.  PMH includes: HTN    PT Comments    Patient seen for mobility progression. Pt has had significant decline in functional mobility since initial PT evaluation on 2/21. Pt presents with impaired activity tolerance and decreased awareness of safety/deficits. Pt impulsively ambulated to bathroom due to ongoing diarrhea and prior to ambulating took off his supplemental O2. Pt with SpO2 desat to 72% after ambulating 20 ft and required up to 10L O2 to recover before ultimately resting at 90% on 5L end of session. Given pt's current mobility level recommending CIR for further skilled PT services to maximize independence and safety with mobility.    Follow Up Recommendations  CIR     Equipment Recommendations  Wheelchair (measurements PT);Wheelchair cushion (measurements PT)    Recommendations for Other Services       Precautions / Restrictions Precautions Precautions: Fall Precaution Comments: watch sats Restrictions Weight Bearing Restrictions: No    Mobility  Bed Mobility Overal bed mobility: Independent                Transfers Overall transfer level: Needs assistance Equipment used: None Transfers: Sit to/from Stand Sit to Stand: Supervision;Min guard         General transfer comment: assist for safety; pt impulsive to get to bathroom and poor awareness of safety and managingn lines including wearing supplemental O2  Ambulation/Gait Ambulation/Gait assistance: Min guard Gait Distance (Feet): 20 Feet Assistive device: None;IV Pole Gait Pattern/deviations: Step-through pattern;Trunk flexed     General Gait Details: min guard for safety; pt ambulated  to bathroom without use of 02 and with SpO2 desat to 72% and O2 donned and increased time for recovery taken before pt transferred to recliner    Stairs             Wheelchair Mobility    Modified Rankin (Stroke Patients Only)       Balance Overall balance assessment: Needs assistance Sitting-balance support: No upper extremity supported;Feet supported Sitting balance-Leahy Scale: Good     Standing balance support: No upper extremity supported;During functional activity Standing balance-Leahy Scale: Fair                              Cognition Arousal/Alertness: Awake/alert Behavior During Therapy: WFL for tasks assessed/performed;Impulsive Overall Cognitive Status: Within Functional Limits for tasks assessed Area of Impairment: Safety/judgement                         Safety/Judgement: Decreased awareness of safety;Decreased awareness of deficits     General Comments: pt with decreased safety awareness and getting up impulsively to go to the bathroom without supplemental O2; pt appears to be unaware of mobility deficits       Exercises      General Comments General comments (skin integrity, edema, etc.): Pt on 3L O2 via [place] upon arrival; pt doffed O2 and impulsively began going to bathroom and SpO2 desat to 72%; up to 10 L O2 via [place] and PLB required to increase SpO2 into 80s; pt on 5L O2 end of session at 90% ; pt's iv pulled out when standing up from commode and RN came to assist  in stopping bleeding from iv site      Pertinent Vitals/Pain Pain Assessment: No/denies pain    Home Living                      Prior Function            PT Goals (current goals can now be found in the care plan section) Progress towards PT goals: Not progressing toward goals - comment    Frequency    Min 3X/week      PT Plan Current plan remains appropriate    Co-evaluation PT/OT/SLP Co-Evaluation/Treatment: Yes Reason for Co-Treatment:  For patient/therapist safety PT goals addressed during session: Mobility/safety with mobility OT goals addressed during session: ADL's and self-care;Strengthening/ROM;Proper use of Adaptive equipment and DME      AM-PAC PT "6 Clicks" Mobility   Outcome Measure  Help needed turning from your back to your side while in a flat bed without using bedrails?: None Help needed moving from lying on your back to sitting on the side of a flat bed without using bedrails?: None Help needed moving to and from a bed to a chair (including a wheelchair)?: A Little Help needed standing up from a chair using your arms (e.g., wheelchair or bedside chair)?: A Little Help needed to walk in hospital room?: A Little Help needed climbing 3-5 steps with a railing? : A Lot 6 Click Score: 19    End of Session Equipment Utilized During Treatment: Oxygen Activity Tolerance: Other (comment)(limited by DOE and SpO2 desat ) Patient left: with call bell/phone within reach;in chair Nurse Communication: Mobility status PT Visit Diagnosis: Difficulty in walking, not elsewhere classified (R26.2);Other (comment)(cardiopulmonary dysfunction)     Time: 1771-1657 PT Time Calculation (min) (ACUTE ONLY): 42 min  Charges:  $Gait Training: 8-22 mins                     Earney Navy, PTA Acute Rehabilitation Services Pager: [phone] Office: [phone]     Darliss Cheney 08/03/2019, 4:56 PM

## 2019-08-03 NOTE — Progress Notes (Addendum)
PROGRESS NOTE    Charles Walters  QQP:[id number] DOB: 1960-11-24 DOA: 03-Aug-2019 PCP: [name], NP    Brief Narrative:  Patient was admitted to the hospital with a working diagnosis of community-acquired pneumonia complicated by acute hypoxic respiratory failure and severe hyponatremia.  59 year old male who presented with hiccups. He does have significant past medical history for hypertension. He reported about 1 week of intractable hiccups associated with loss of appetite, and watery diarrhea. Positive dyspnea on exertion. On his initial physical examination blood pressure 156/119, heart rate 108, respiratory rate 27, oxygen saturation 98%, his lungs had coarse breath sounds bilaterally, no wheezing, heart S1-S2 present and rhythmic, abdomen was soft and nontender, no lower extremity edema. Sodium 114, potassium 3.0, chloride 80, bicarb 17, glucose 126, BUN 7, creatinine 0.77, white count 10.9, hemoglobin 12.3, hematocrit 33.1, platelets 235. SARS COVID-19 negative. Urinary osmolarity 507, urine sodium <10, alcohol less than 10.His chest radiograph had bilateral bibasilar reticulonodular infiltrates, predominantly right base. EKG 128 bpm, normal axis, normal intervals, right atrial enlargement, sinus rhythm, poor R wave progression, LVH, no ST segment T wave changes.  Patient received hypertonic saline and intravenous antibiotics, further work-up with CT chest negative for pulmonary embolism, echocardiography with preserved LV and RV systolic function.   Assessment & Plan:   Principal Problem:   Community acquired bacterial pneumonia Active Problems:   Hyponatremia   Intractable hiccups   Diarrhea   Asthenia   Essential hypertension   1.  Acute hypoxic respiratory failure due to community-acquired pneumonia (atypical/viral), bilateral.  Patient with worsening hypoxemia and worsening imaging, bilateral infiltrates at bases, more dense on the right base, personally  reviewed. Wbc up to 12,9, cultures with no growth. Oxymetry 97% on 4 L/min per [place]. Urine output post diuresis with furosemide 1,675 ml.   Suspected MRSA penumonia, post viral. Will continue medical therapy with IV vancomycin and Cefepime, supplemental 02 per [place], airway clearing techniques (incentive spirometer and flutter valve). Recheck MRSA screen.   Patient continue to be at high risk for worsening hypoxic respiratory failure.   2.  Hypovolemic hypo-osmolar hyponatremia.  Sodium up to 130, patient tolerated well diuresis with 1,675 ml urine output, renal function with serum cr at 0,73, K at 3,6 and serum bicarbonate at 25. Will follow on renal panel in am.   3.  Hypertension. Blood pressure 115/106 with HR at 125 sinus tachycardia. Will continue blood pressure with amlodipine and will add metoprolol,. Hold on diuretics for now.   4.  Acute diarrhea.  resolved, likely part of viral syndrome.  5.  Tobacco abuse.   smoking cessation, add nicotine patch.    Nutrition Status: Nutrition Problem: Increased nutrient needs Etiology: acute illness Signs/Symptoms: estimated needs Interventions: Ensure Enlive (each supplement provides 350kcal and 20 grams of protein), MVI    Antimicrobials:   IV Vancomycin   IV cefepime    Subjective: Yesterday's discharge was cancelled due to respiratory distress and worsening hypoxemia. Now on broad antibiotic therapy, continue to have tachycardia but denies dyspnea or chest pain, no nausea or vomiting.   Objective: Vitals:   08/02/19 2356 08/03/19 0340 08/03/19 0509 08/03/19 0745  BP: 125/76 (!) 139/95  133/86  Pulse:  (!) 119  (!) 117  Resp: 20 (!) 33 (!) 22 (!) 28  Temp: 99.3 F (37.4 C) 100.2 F (37.9 C)  99.3 F (37.4 C)  TempSrc: Oral Oral  Oral  SpO2: 97% 95%  98%  Weight:      Height:  Intake/Output Summary (Last 24 hours) at 08/03/2019 0832 Last data filed at 08/03/2019 0342 Gross per 24 hour  Intake 970 ml  Output 1675  ml  Net -705 ml   Filed Weights   07-31-19 1319 07/26/19 0000  Weight: 79.4 kg 64.4 kg    Examination:   General: deconditioned and ill looking appearing, positive dyspnea at rest.  Neurology: Awake and alert, non focal  E ENT: mild pallor, no icterus, oral mucosa moist Cardiovascular: No JVD. S1-S2 present, rhythmic, no gallops, rubs, or murmurs. No lower extremity edema. Pulmonary: positive breath sounds bilaterally, decreased air movement, no wheezing, positive bilateral rhonchi and rales. Gastrointestinal. Abdomen with  organomegaly, non tender, no rebound or guarding Skin. No rashes Musculoskeletal: no joint deformities     Data Reviewed: I have personally reviewed following labs and imaging studies  CBC: Recent Labs  Lab 07/28/19 0616 07/29/19 0625 07/31/19 0837 08/01/19 0232 08/02/19 0216  WBC 8.6 9.8 10.3 11.4* 11.5*  NEUTROABS 6.6 7.9* 8.5* 9.2* 9.1*  HGB 9.0* 8.8* 9.1* 8.0* 8.0*  HCT 24.9* 24.1* 25.8* 22.2* 22.0*  MCV 88.6 88.3 90.2 88.4 88.4  PLT 158 175 206 215 144   Basic Metabolic Panel: Recent Labs  Lab 07/28/19 0616 07/28/19 0953 07/29/19 0201 07/29/19 0625 07/31/19 0837 08/01/19 0232 08/02/19 0216  NA 126*   < > 126* 123*  126* 128* 130* 130*  K 3.6  --   --  3.6 4.0 3.5 3.8  CL 96*  --   --  96* 95* 96* 96*  CO2 20*  --   --  21* 27 25 25   GLUCOSE 108*  --   --  99 118* 139* 110*  BUN 9  --   --  6 5* 7 6  CREATININE 0.64  --   --  0.55* 0.61 0.61 0.65  CALCIUM 8.1*  --   --  7.9* 8.3* 8.3* 8.2*  MG 1.8  --   --  1.6* 1.6* 1.7  --    < > = values in this interval not displayed.   GFR: Estimated Creatinine Clearance: 92.8 mL/min (by C-G formula based on SCr of 0.65 mg/dL). Liver Function Tests: Recent Labs  Lab 07/28/19 0616 07/29/19 0625 07/31/19 0837 08/01/19 0232  AST 32 40 47* 45*  ALT 24 33 42 43  ALKPHOS 52 54 58 56  BILITOT 0.7 0.4 0.8 0.6  PROT 5.9* 5.5* 6.2* 5.9*  ALBUMIN 1.8* 1.6* 1.7* 1.6*   No results for  input(s): LIPASE, AMYLASE in the last 168 hours. No results for input(s): AMMONIA in the last 168 hours. Coagulation Profile: No results for input(s): INR, PROTIME in the last 168 hours. Cardiac Enzymes: No results for input(s): CKTOTAL, CKMB, CKMBINDEX, TROPONINI in the last 168 hours. BNP (last 3 results) No results for input(s): PROBNP in the last 8760 hours. HbA1C: No results for input(s): HGBA1C in the last 72 hours. CBG: No results for input(s): GLUCAP in the last 168 hours. Lipid Profile: No results for input(s): CHOL, HDL, LDLCALC, TRIG, CHOLHDL, LDLDIRECT in the last 72 hours. Thyroid Function Tests: No results for input(s): TSH, T4TOTAL, FREET4, T3FREE, THYROIDAB in the last 72 hours. Anemia Panel: No results for input(s): VITAMINB12, FOLATE, FERRITIN, TIBC, IRON, RETICCTPCT in the last 72 hours.    Radiology Studies: I have reviewed all of the imaging during this hospital visit personally     Scheduled Meds: . enoxaparin (LOVENOX) injection  40 mg Subcutaneous QHS  . feeding supplement (ENSURE ENLIVE)  237 mL Oral TID BM  . ipratropium-albuterol  3 mL Nebulization Q6H  . mouth rinse  15 mL Mouth Rinse BID  . multivitamin with minerals  1 tablet Oral Daily  . sodium chloride flush  3 mL Intravenous Once   Continuous Infusions: . ceFEPime (MAXIPIME) IV 2 g (08/02/19 2331)  . vancomycin 1,000 mg (08/03/19 0512)     LOS: 9 days        Charles Walters Annett Gula, MD

## 2019-08-04 ENCOUNTER — Inpatient Hospital Stay (HOSPITAL_COMMUNITY): Payer: Medicaid Other

## 2019-08-04 LAB — BASIC METABOLIC PANEL
Anion gap: 10 (ref 5–15)
BUN: 9 mg/dL (ref 6–20)
CO2: 23 mmol/L (ref 22–32)
Calcium: 8.2 mg/dL — ABNORMAL LOW (ref 8.9–10.3)
Chloride: 99 mmol/L (ref 98–111)
Creatinine, Ser: 0.67 mg/dL (ref 0.61–1.24)
GFR calc Af Amer: >60 mL/min (ref >60)
GFR calc non Af Amer: >60 mL/min (ref >60)
Glucose, Bld: 95 mg/dL (ref 70–99)
Potassium: 3.8 mmol/L (ref 3.5–5.1)
Sodium: 132 mmol/L — ABNORMAL LOW (ref 135–145)

## 2019-08-04 LAB — CBC WITH DIFFERENTIAL/PLATELET
Abs Immature Granulocytes: 0.08 10*3/uL — ABNORMAL HIGH (ref 0.00–0.07)
Basophils Absolute: 0 10*3/uL (ref 0.0–0.1)
Basophils Relative: 0 %
Eosinophils Absolute: 0 10*3/uL (ref 0.0–0.5)
Eosinophils Relative: 0 %
HCT: 21.4 % — ABNORMAL LOW (ref 39.0–52.0)
Hemoglobin: 7.7 g/dL — ABNORMAL LOW (ref 13.0–17.0)
Immature Granulocytes: 1 %
Lymphocytes Relative: 9 %
Lymphs Abs: 1.1 10*3/uL (ref 0.7–4.0)
MCH: 31.6 pg (ref 26.0–34.0)
MCHC: 36 g/dL (ref 30.0–36.0)
MCV: 87.7 fL (ref 80.0–100.0)
Monocytes Absolute: 1.3 10*3/uL — ABNORMAL HIGH (ref 0.1–1.0)
Monocytes Relative: 10 %
Neutro Abs: 10.8 10*3/uL — ABNORMAL HIGH (ref 1.7–7.7)
Neutrophils Relative %: 80 %
Platelets: 250 10*3/uL (ref 150–400)
RBC: 2.44 MIL/uL — ABNORMAL LOW (ref 4.22–5.81)
RDW: 12.3 % (ref 11.5–15.5)
WBC: 13.4 10*3/uL — ABNORMAL HIGH (ref 4.0–10.5)
nRBC: 0 % (ref 0.0–0.2)

## 2019-08-04 LAB — CULTURE, BLOOD (ROUTINE X 2)
Culture: NO GROWTH
Culture: NO GROWTH
Special Requests: ADEQUATE
Special Requests: ADEQUATE

## 2019-08-04 MED ORDER — PANTOPRAZOLE SODIUM 40 MG PO TBEC
40.0000 mg | DELAYED_RELEASE_TABLET | Freq: Every day | ORAL | Status: DC
  Administered 2019-08-04 – 2019-08-06 (×3): 40 mg via ORAL
  Filled 2019-08-04 (×4): qty 1

## 2019-08-04 MED ORDER — FUROSEMIDE 10 MG/ML IJ SOLN
20.0000 mg | Freq: Once | INTRAMUSCULAR | Status: AC
  Administered 2019-08-04: 20 mg via INTRAVENOUS
  Filled 2019-08-04: qty 2

## 2019-08-04 MED ORDER — METOCLOPRAMIDE HCL 10 MG PO TABS: 5.0000 mg | ORAL_TABLET | Freq: Three times a day (TID) | ORAL | Status: DC | PRN

## 2019-08-04 NOTE — Progress Notes (Signed)
PROGRESS NOTE    Charles Walters  HYI:[id number] DOB: 02-Sep-1960 DOA: 2019-07-26 PCP: [name], NP    Brief Narrative:  Patient was admitted to the hospital with a working diagnosis of community-acquired pneumonia complicated by acute hypoxic respiratory failure and severe hyponatremia.Patient with worsening pneumonia suspected multidrug resistant bacteria.   59 year old male who presented with hiccups. He does have significant past medical history for hypertension. He reported about 1 week of intractable hiccups associated with loss of appetite, and watery diarrhea. Positive dyspnea on exertion. On his initial physical examination blood pressure 156/119, heart rate 108, respiratory rate 27, oxygen saturation 98%, his lungs had coarse breath sounds bilaterally, no wheezing, heart S1-S2 present and rhythmic, abdomen was soft and nontender, no lower extremity edema. Sodium 114, potassium 3.0, chloride 80, bicarb 17, glucose 126, BUN 7, creatinine 0.77, white count 10.9, hemoglobin 12.3, hematocrit 33.1, platelets 235. SARS COVID-19 negative. Urinary osmolarity 507, urine sodium <10, alcohol less than 10.His chest radiograph had bilateral bibasilar reticulonodular infiltrates, predominantly right base. EKG 128 bpm, normal axis, normal intervals, right atrial enlargement, sinus rhythm, poor R wave progression, LVH, no ST segment T wave changes.  Patient received hypertonic saline and intravenous antibiotics, further work-up with CT chest negative for pulmonary embolism, echocardiography with preserved LV and RV systolic function.  His discharge was cancelled due to worsening hypoxemic respiratory failure, along with worsening lungs radiographic changes, bilateral infiltrates at bases, more dense on the left base, . Resumed antibiotic therapy with vancomycin and cefepime, suspected multidrug resistant bacterial pneumonia.     Assessment & Plan:   Principal Problem:   Community  acquired bacterial pneumonia Active Problems:   Hyponatremia   Intractable hiccups   Diarrhea   Asthenia   Essential hypertension   1.Acute hypoxic respiratory failure due to community-acquired pneumonia (atypical/viral), bilateral.Last night worsening oxygen requirements up to 10 L per HFN. Worsening leukocytosis up to 13,4 his cultures continue with no growth. Film this am personally reviewed with persistent bilateral infiltrates at bases, reticulonodular, more on the left.   Continue with broad spectrum antibiotic therapy with IV vancomycin and Cefepime. Trial of furosemide 20 mg IV. Target oxygen saturation more than 88%, follow oxymetry and continue telemetry monitoring. Continue bronchodilator therapy and airway clearing techniques with flutter valve and incentive spirometer.   Patient continue to be at high risk for worsening hypoxic respiratory failure.   2.Hypovolemic hypo-osmolar hyponatremia/ resolved.His renal function is stable with serum cr at 0,67 and K at 3,8 with serum bicarbonate at 23. Avoid hypotension and nephrotoxic medications, will follow up on renal panel in am.   3.Hypertension. patient drinks alcohol at home daily, 3 to 4 cigarrettes daily, will continue metoprolol for blood pressure control, possible withdrawal related tachycardia, continue with nicotine patch.    4.Acute diarrhea.resolved.   5.Tobacco abuse.nicotine patch. Smoking cessation.    DVT prophylaxis: enoxaparin   Code Status:  full Family Communication: no family at the bedside  Disposition Plan/ discharge barriers: patient critically ill.    Nutrition Status: Nutrition Problem: Increased nutrient needs Etiology: acute illness Signs/Symptoms: estimated needs Interventions: Ensure Enlive (each supplement provides 350kcal and 20 grams of protein), MVI    Antimicrobials:   Vancomycin and cefepime.     Subjective: Patient with worsening hypoxemia and increased  oxygen requirements, now on high flow nasal cannula, he does desaturate down to 80 wit movement. He does have dyspnea, but not cough or chest pain. At home smokes 3 to 4 cigarettes daily and drinks one  beer per day.   Objective: Vitals:   08/04/19 0347 08/04/19 0350 08/04/19 0734 08/04/19 0747  BP: 134/83  (!) 152/94   Pulse: (!) 118  (!) 117   Resp: (!) 32 (!) 24 (!) 35   Temp: 99.1 F (37.3 C)  98.7 F (37.1 C)   TempSrc: Oral  Oral   SpO2: 94%  94% 97%  Weight:      Height:        Intake/Output Summary (Last 24 hours) at 08/04/2019 7782 Last data filed at 08/03/2019 2300 Gross per 24 hour  Intake -  Output 500 ml  Net -500 ml   Filed Weights   07/22/2019 1319 07/26/19 0000  Weight: 79.4 kg 64.4 kg    Examination:   General: deconditioned, positive dyspnea at rest.  Neurology: Awake and alert, non focal  E ENT: mild  pallor, no icterus, oral mucosa moist Cardiovascular: No JVD. S1-S2 present, rhythmic, tachycardic with no gallops, rubs, or murmurs. No lower extremity edema. Pulmonary: positive breath sounds bilaterally, adequate air movement, no wheezing or rhonchi, mild rales at bases more left than right. Gastrointestinal. Abdomen with, no organomegaly, non tender, no rebound or guarding Skin. No rashes Musculoskeletal: no joint deformities     Data Reviewed: I have personally reviewed following labs and imaging studies  CBC: Recent Labs  Lab 07/31/19 0837 08/01/19 0232 08/02/19 0216 08/03/19 0915 08/04/19 0228  WBC 10.3 11.4* 11.5* 12.9* 13.4*  NEUTROABS 8.5* 9.2* 9.1* 10.6* 10.8*  HGB 9.1* 8.0* 8.0* 8.1* 7.7*  HCT 25.8* 22.2* 22.0* 23.1* 21.4*  MCV 90.2 88.4 88.4 89.5 87.7  PLT 206 215 232 276 423   Basic Metabolic Panel: Recent Labs  Lab 07/29/19 0625 07/29/19 0625 07/31/19 0837 08/01/19 0232 08/02/19 0216 08/03/19 0915 08/04/19 0228  NA 123*  126*   < > 128* 130* 130* 130* 132*  K 3.6   < > 4.0 3.5 3.8 3.6 3.8  CL 96*   < > 95* 96* 96* 94*  99  CO2 21*   < > 27 25 25 25 23   GLUCOSE 99   < > 118* 139* 110* 98 95  BUN 6   < > 5* 7 6 8 9   CREATININE 0.55*   < > 0.61 0.61 0.65 0.72 0.67  CALCIUM 7.9*   < > 8.3* 8.3* 8.2* 8.3* 8.2*  MG 1.6*  --  1.6* 1.7  --   --   --    < > = values in this interval not displayed.   GFR: Estimated Creatinine Clearance: 92.8 mL/min (by C-G formula based on SCr of 0.67 mg/dL). Liver Function Tests: Recent Labs  Lab 07/29/19 0625 07/31/19 0837 08/01/19 0232  AST 40 47* 45*  ALT 33 42 43  ALKPHOS 54 58 56  BILITOT 0.4 0.8 0.6  PROT 5.5* 6.2* 5.9*  ALBUMIN 1.6* 1.7* 1.6*   No results for input(s): LIPASE, AMYLASE in the last 168 hours. No results for input(s): AMMONIA in the last 168 hours. Coagulation Profile: No results for input(s): INR, PROTIME in the last 168 hours. Cardiac Enzymes: No results for input(s): CKTOTAL, CKMB, CKMBINDEX, TROPONINI in the last 168 hours. BNP (last 3 results) No results for input(s): PROBNP in the last 8760 hours. HbA1C: No results for input(s): HGBA1C in the last 72 hours. CBG: No results for input(s): GLUCAP in the last 168 hours. Lipid Profile: No results for input(s): CHOL, HDL, LDLCALC, TRIG, CHOLHDL, LDLDIRECT in the last 72 hours. Thyroid Function Tests:  No results for input(s): TSH, T4TOTAL, FREET4, T3FREE, THYROIDAB in the last 72 hours. Anemia Panel: No results for input(s): VITAMINB12, FOLATE, FERRITIN, TIBC, IRON, RETICCTPCT in the last 72 hours.    Radiology Studies: I have reviewed all of the imaging during this hospital visit personally     Scheduled Meds: . enoxaparin (LOVENOX) injection  40 mg Subcutaneous QHS  . feeding supplement (ENSURE ENLIVE)  237 mL Oral TID BM  . ipratropium-albuterol  3 mL Nebulization Q6H  . mouth rinse  15 mL Mouth Rinse BID  . metoprolol tartrate  25 mg Oral BID  . multivitamin with minerals  1 tablet Oral Daily  . nicotine  21 mg Transdermal Daily  . sodium chloride flush  3 mL Intravenous  Once   Continuous Infusions: . ceFEPime (MAXIPIME) IV 2 g (08/04/19 0762)  . vancomycin 1,000 mg (08/04/19 0553)     LOS: 10 days        Anisten Tomassi Annett Gula, MD

## 2019-08-04 NOTE — Plan of Care (Signed)

## 2019-08-05 LAB — CBC WITH DIFFERENTIAL/PLATELET
Abs Immature Granulocytes: 0.12 10*3/uL — ABNORMAL HIGH (ref 0.00–0.07)
Basophils Absolute: 0 10*3/uL (ref 0.0–0.1)
Basophils Relative: 0 %
Eosinophils Absolute: 0.1 10*3/uL (ref 0.0–0.5)
Eosinophils Relative: 0 %
HCT: 22.6 % — ABNORMAL LOW (ref 39.0–52.0)
Hemoglobin: 7.9 g/dL — ABNORMAL LOW (ref 13.0–17.0)
Immature Granulocytes: 1 %
Lymphocytes Relative: 10 %
Lymphs Abs: 1.5 10*3/uL (ref 0.7–4.0)
MCH: 31.6 pg (ref 26.0–34.0)
MCHC: 35 g/dL (ref 30.0–36.0)
MCV: 90.4 fL (ref 80.0–100.0)
Monocytes Absolute: 1.8 10*3/uL — ABNORMAL HIGH (ref 0.1–1.0)
Monocytes Relative: 11 %
Neutro Abs: 12.6 10*3/uL — ABNORMAL HIGH (ref 1.7–7.7)
Neutrophils Relative %: 78 %
Platelets: 278 10*3/uL (ref 150–400)
RBC: 2.5 MIL/uL — ABNORMAL LOW (ref 4.22–5.81)
RDW: 12.5 % (ref 11.5–15.5)
WBC: 16.2 10*3/uL — ABNORMAL HIGH (ref 4.0–10.5)
nRBC: 0 % (ref 0.0–0.2)

## 2019-08-05 LAB — BASIC METABOLIC PANEL
Anion gap: 10 (ref 5–15)
BUN: 12 mg/dL (ref 6–20)
CO2: 24 mmol/L (ref 22–32)
Calcium: 8.1 mg/dL — ABNORMAL LOW (ref 8.9–10.3)
Chloride: 98 mmol/L (ref 98–111)
Creatinine, Ser: 0.62 mg/dL (ref 0.61–1.24)
GFR calc Af Amer: >60 mL/min (ref >60)
GFR calc non Af Amer: >60 mL/min (ref >60)
Glucose, Bld: 125 mg/dL — ABNORMAL HIGH (ref 70–99)
Potassium: 3.6 mmol/L (ref 3.5–5.1)
Sodium: 132 mmol/L — ABNORMAL LOW (ref 135–145)

## 2019-08-05 LAB — VANCOMYCIN, TROUGH: Vancomycin Tr: 8 ug/mL — ABNORMAL LOW (ref 15–20)

## 2019-08-05 MED ORDER — THIAMINE HCL 100 MG PO TABS
100.0000 mg | ORAL_TABLET | Freq: Every day | ORAL | Status: DC
  Administered 2019-08-05 – 2019-08-06 (×2): 100 mg via ORAL
  Filled 2019-08-05 (×3): qty 1

## 2019-08-05 MED ORDER — POTASSIUM CHLORIDE CRYS ER 20 MEQ PO TBCR
40.0000 meq | EXTENDED_RELEASE_TABLET | Freq: Once | ORAL | Status: AC
  Administered 2019-08-05: 12:00:00 40 meq via ORAL
  Filled 2019-08-05: qty 2

## 2019-08-05 MED ORDER — IPRATROPIUM-ALBUTEROL 20-100 MCG/ACT IN AERS
1.0000 | INHALATION_SPRAY | Freq: Four times a day (QID) | RESPIRATORY_TRACT | Status: DC
  Administered 2019-08-05 – 2019-08-10 (×17): 1 via RESPIRATORY_TRACT
  Filled 2019-08-05: qty 4

## 2019-08-05 MED ORDER — VANCOMYCIN HCL IN DEXTROSE 1-5 GM/200ML-% IV SOLN
1000.0000 mg | Freq: Three times a day (TID) | INTRAVENOUS | Status: DC
  Administered 2019-08-05 – 2019-08-07 (×5): 1000 mg via INTRAVENOUS
  Filled 2019-08-05 (×7): qty 200

## 2019-08-05 NOTE — Plan of Care (Signed)

## 2019-08-05 NOTE — Progress Notes (Signed)
Pharmacy Antibiotic Note  Charles Walters is a 59 y.o. male with recent CAP and was on cefepime then levaquin (last dose of levaquin was 2/24; total duration of about 7 days antibiotics). Pharmacy consulted to restart cefepime with increased work of breathing and now to add back vancomycin (previously receiving 2/18-2/19 and 2/22-2/24). SCr stable at 0.65.  Scr remains table at 0.6 (CrCl 92 mL/min). Vancomycin trough 12 hours after last dose came back subtherapeutic at 8. WBC increasing to 16.2. Afebrile.    Plan Cefepime to 2g IV q8h Change to vancomycin 1 g IV every 8 hours.  Monitor clinical progress, c/s, renal function F/u de-escalation plan/LOT, vancomycin levels as indicated   Height: 6\' 1"  (185.4 cm) Weight: 141 lb 15.6 oz (64.4 kg)(Shoes + telemetry box removed at time of weighing) IBW/kg (Calculated) : 79.9  Temp (24hrs), Avg:98.2 F (36.8 C), Min:97.2 F (36.2 C), Max:99.5 F (37.5 C)  Recent Labs  Lab 08/01/19 0232 08/02/19 0216 08/03/19 0915 08/04/19 0228 08/05/19 0212 08/05/19 1614  WBC 11.4* 11.5* 12.9* 13.4* 16.2*  --   CREATININE 0.61 0.65 0.72 0.67 0.62  --   VANCOTROUGH  --   --   --   --   --  8*    Estimated Creatinine Clearance: 92.8 mL/min (by C-G formula based on SCr of 0.62 mg/dL).    No Known Allergies  08/08/2019, PharmD, BCCCP Clinical Pharmacist  Phone: [phone]  Please check AMION for all [facility] phone numbers After 10:00 PM, call Main Pharmacy [phone] 08/05/2019 4:57 PM

## 2019-08-05 NOTE — Progress Notes (Signed)
PROGRESS NOTE    Jacquis Paxton  ZMO:[id number] DOB: 03-Nov-1960 DOA: 30-Jul-2019 PCP: [name], NP    Brief Narrative:  Patient was admitted to the hospital with a working diagnosis of community-acquired pneumonia complicated by acute hypoxic respiratory failure and severe hyponatremia.Patient with worsening pneumonia suspected multidrug resistant bacteria.   59 year old male who presented with hiccups. He does have significant past medical history for hypertension. He reported about 1 week of intractable hiccups associated with loss of appetite, and watery diarrhea. Positive dyspnea on exertion. On his initial physical examination blood pressure 156/119, heart rate 108, respiratory rate 27, oxygen saturation 98%, his lungs had coarse breath sounds bilaterally, no wheezing, heart S1-S2 present and rhythmic, abdomen was soft and nontender, no lower extremity edema. Sodium 114, potassium 3.0, chloride 80, bicarb 17, glucose 126, BUN 7, creatinine 0.77, white count 10.9, hemoglobin 12.3, hematocrit 33.1, platelets 235. SARS COVID-19 negative. Urinary osmolarity 507, urine sodium <10, alcohol less than 10.His chest radiograph had bilateral bibasilar reticulonodular infiltrates, predominantly right base. EKG 128 bpm, normal axis, normal intervals, right atrial enlargement, sinus rhythm, poor R wave progression, LVH, no ST segment T wave changes.  Patient received hypertonic saline and intravenous antibiotics, further work-up with CT chest negative for pulmonary embolism, echocardiography with preserved LV and RV systolic function.  His discharge was cancelled due to worsening hypoxemic respiratory failure, along with worsening lungs radiographic changes, bilateral infiltrates at bases, more dense on the left base, . Resumed antibiotic therapy with vancomycin and cefepime, suspected multidrug resistant bacterial pneumonia.      Assessment & Plan:   Principal Problem:   Community  acquired bacterial pneumonia Active Problems:   Hyponatremia   Intractable hiccups   Diarrhea   Asthenia   Essential hypertension   1.Acute hypoxic respiratory failure due to community-acquired pneumonia (atypical/viral), bilateral.Improved oxygen requirements down to 5, L/ min per HFNC, but continue worsening leukocytosis up to 16.2, blood cultures continue with no growth. Patient continue to be tachycardic.   On broad spectrum antibiotic therapy with IV vancomycin and Cefepime. Responded well to trial of diuresis with furosemide, will continue with bronchodilator therapy and airway clearing techniques with flutter valve and incentive spirometer.   At high risk for worsening hypoxic respiratory failure.  2.Hypovolemic hypo-osmolar hyponatremia/ resolved.Renal with serum cr at 0,62 and K at 3,6 with serum bicarbonate at 24. Urine output over last 24 H is 1,455 ml.   Will continue to follow renal panel in am, will hold on diuresis today.   3.Hypertension.patient drinks alcohol at home daily, 3 to 4 cigarrettes daily, On metoprolol for blood pressure control, and continue with nicotine patch. Continue multivitamins and thiamine. No clear signs of withdrawal.   4.Acute diarrhea.resolved.   5.Tobacco abuse.Continue with nicotine patch and smoking cessation.    DVT prophylaxis: enoxaparin   Code Status:  full Family Communication: no family at the bedside  Disposition Plan/ discharge barriers: patient critically ill.    Nutrition Status: Nutrition Problem: Increased nutrient needs Etiology: acute illness Signs/Symptoms: estimated needs Interventions: Ensure Enlive (each supplement provides 350kcal and 20 grams of protein), MVI    Subjective: Patient feels frustrated of being hospitalized, no nausea or vomiting, continue to have dyspnea and cough, no chest pain. Patient declined anxiolytics.   Objective: Vitals:   08/04/19 2320 08/05/19 0352 08/05/19  0728 08/05/19 0747  BP: 104/73 132/90 (!) 129/92   Pulse:  (!) 113 (!) 109   Resp:  17 (!) 28   Temp:  99.5 F (  37.5 C) 98.3 F (36.8 C)   TempSrc:  Oral Oral   SpO2:  95% 94% 97%  Weight:      Height:        Intake/Output Summary (Last 24 hours) at 08/05/2019 0858 Last data filed at 08/05/2019 0400 Gross per 24 hour  Intake 1200 ml  Output 975 ml  Net 225 ml   Filed Weights   Aug 07, 2019 1319 07/26/19 0000  Weight: 79.4 kg 64.4 kg    Examination:   General: Not in pain or dyspnea,  Neurology: Awake and alert, non focal,  E ENT: no pallor, no icterus, oral mucosa moist Cardiovascular: No JVD. S1-S2 present, rhythmic tachycardic, no gallops, rubs, or murmurs. No lower extremity edema. Pulmonary: positive breath sounds bilaterally, decreased air movement, no wheezing, bilateral rhonchi and rales. Gastrointestinal. Abdomen with no organomegaly, non tender, no rebound or guarding Skin. No rashes Musculoskeletal: no joint deformities     Data Reviewed: I have personally reviewed following labs and imaging studies  CBC: Recent Labs  Lab 08/01/19 0232 08/02/19 0216 08/03/19 0915 08/04/19 0228 08/05/19 0212  WBC 11.4* 11.5* 12.9* 13.4* 16.2*  NEUTROABS 9.2* 9.1* 10.6* 10.8* 12.6*  HGB 8.0* 8.0* 8.1* 7.7* 7.9*  HCT 22.2* 22.0* 23.1* 21.4* 22.6*  MCV 88.4 88.4 89.5 87.7 90.4  PLT 215 232 276 250 278   Basic Metabolic Panel: Recent Labs  Lab 07/31/19 0837 07/31/19 0837 08/01/19 0232 08/02/19 0216 08/03/19 0915 08/04/19 0228 08/05/19 0212  NA 128*   < > 130* 130* 130* 132* 132*  K 4.0   < > 3.5 3.8 3.6 3.8 3.6  CL 95*   < > 96* 96* 94* 99 98  CO2 27   < > 25 25 25 23 24   GLUCOSE 118*   < > 139* 110* 98 95 125*  BUN 5*   < > 7 6 8 9 12   CREATININE 0.61   < > 0.61 0.65 0.72 0.67 0.62  CALCIUM 8.3*   < > 8.3* 8.2* 8.3* 8.2* 8.1*  MG 1.6*  --  1.7  --   --   --   --    < > = values in this interval not displayed.   GFR: Estimated Creatinine Clearance: 92.8  mL/min (by C-G formula based on SCr of 0.62 mg/dL). Liver Function Tests: Recent Labs  Lab 07/31/19 0837 08/01/19 0232  AST 47* 45*  ALT 42 43  ALKPHOS 58 56  BILITOT 0.8 0.6  PROT 6.2* 5.9*  ALBUMIN 1.7* 1.6*   No results for input(s): LIPASE, AMYLASE in the last 168 hours. No results for input(s): AMMONIA in the last 168 hours. Coagulation Profile: No results for input(s): INR, PROTIME in the last 168 hours. Cardiac Enzymes: No results for input(s): CKTOTAL, CKMB, CKMBINDEX, TROPONINI in the last 168 hours. BNP (last 3 results) No results for input(s): PROBNP in the last 8760 hours. HbA1C: No results for input(s): HGBA1C in the last 72 hours. CBG: No results for input(s): GLUCAP in the last 168 hours. Lipid Profile: No results for input(s): CHOL, HDL, LDLCALC, TRIG, CHOLHDL, LDLDIRECT in the last 72 hours. Thyroid Function Tests: No results for input(s): TSH, T4TOTAL, FREET4, T3FREE, THYROIDAB in the last 72 hours. Anemia Panel: No results for input(s): VITAMINB12, FOLATE, FERRITIN, TIBC, IRON, RETICCTPCT in the last 72 hours.    Radiology Studies: I have reviewed all of the imaging during this hospital visit personally     Scheduled Meds: . enoxaparin (LOVENOX) injection  40 mg  Subcutaneous QHS  . feeding supplement (ENSURE ENLIVE)  237 mL Oral TID BM  . ipratropium-albuterol  3 mL Nebulization Q6H  . mouth rinse  15 mL Mouth Rinse BID  . metoprolol tartrate  25 mg Oral BID  . multivitamin with minerals  1 tablet Oral Daily  . nicotine  21 mg Transdermal Daily  . pantoprazole  40 mg Oral Daily  . sodium chloride flush  3 mL Intravenous Once   Continuous Infusions: . ceFEPime (MAXIPIME) IV 2 g (08/05/19 0630)  . vancomycin 1,000 mg (08/05/19 0434)     LOS: 11 days        Mohab Ashby Gerome Apley, MD

## 2019-08-06 ENCOUNTER — Inpatient Hospital Stay (HOSPITAL_COMMUNITY): Payer: Medicaid Other

## 2019-08-06 DIAGNOSIS — J849 Interstitial pulmonary disease, unspecified: Secondary | ICD-10-CM

## 2019-08-06 LAB — BASIC METABOLIC PANEL
Anion gap: 9 (ref 5–15)
BUN: 10 mg/dL (ref 6–20)
CO2: 25 mmol/L (ref 22–32)
Calcium: 8.2 mg/dL — ABNORMAL LOW (ref 8.9–10.3)
Chloride: 101 mmol/L (ref 98–111)
Creatinine, Ser: 0.66 mg/dL (ref 0.61–1.24)
GFR calc Af Amer: >60 mL/min (ref >60)
GFR calc non Af Amer: >60 mL/min (ref >60)
Glucose, Bld: 100 mg/dL — ABNORMAL HIGH (ref 70–99)
Potassium: 4.3 mmol/L (ref 3.5–5.1)
Sodium: 135 mmol/L (ref 135–145)

## 2019-08-06 LAB — CBC WITH DIFFERENTIAL/PLATELET
Abs Immature Granulocytes: 0.12 10*3/uL — ABNORMAL HIGH (ref 0.00–0.07)
Basophils Absolute: 0 10*3/uL (ref 0.0–0.1)
Basophils Relative: 0 %
Eosinophils Absolute: 0.1 10*3/uL (ref 0.0–0.5)
Eosinophils Relative: 0 %
HCT: 21.6 % — ABNORMAL LOW (ref 39.0–52.0)
Hemoglobin: 7.5 g/dL — ABNORMAL LOW (ref 13.0–17.0)
Immature Granulocytes: 1 %
Lymphocytes Relative: 9 %
Lymphs Abs: 1.5 10*3/uL (ref 0.7–4.0)
MCH: 31 pg (ref 26.0–34.0)
MCHC: 34.7 g/dL (ref 30.0–36.0)
MCV: 89.3 fL (ref 80.0–100.0)
Monocytes Absolute: 1.9 10*3/uL — ABNORMAL HIGH (ref 0.1–1.0)
Monocytes Relative: 11 %
Neutro Abs: 13 10*3/uL — ABNORMAL HIGH (ref 1.7–7.7)
Neutrophils Relative %: 79 %
Platelets: 272 10*3/uL (ref 150–400)
RBC: 2.42 MIL/uL — ABNORMAL LOW (ref 4.22–5.81)
RDW: 12.4 % (ref 11.5–15.5)
WBC: 16.5 10*3/uL — ABNORMAL HIGH (ref 4.0–10.5)
nRBC: 0 % (ref 0.0–0.2)

## 2019-08-06 MED ORDER — LORAZEPAM 2 MG/ML IJ SOLN
0.5000 mg | Freq: Once | INTRAMUSCULAR | Status: AC
  Administered 2019-08-06: 0.5 mg via INTRAVENOUS
  Filled 2019-08-06: qty 1

## 2019-08-06 MED ORDER — METOPROLOL TARTRATE 5 MG/5ML IV SOLN
5.0000 mg | INTRAVENOUS | Status: DC | PRN
  Administered 2019-08-06: 5 mg via INTRAVENOUS
  Filled 2019-08-06 (×2): qty 5

## 2019-08-06 NOTE — Progress Notes (Signed)
PT Cancellation Note  Patient Details Name: Charles Walters MRN: [id number] DOB: 1961-04-01   Cancelled Treatment:    Reason Eval/Treat Not Completed: Patient not medically ready. Pt's RR at 38, tachycardic and SPO2 is low, at rest. RN deferred today until vitals improve. Acute PT to return as able to progress mobility.  Lewis Shock, PT, DPT Acute Rehabilitation Services Pager #: [phone] Office #: [phone]    Iona Hansen 08/06/2019, 11:34 AM

## 2019-08-06 NOTE — Progress Notes (Signed)
)  Patient placed on NRB as mouth breathing frequently and dropping sats into the 50

## 2019-08-06 NOTE — Plan of Care (Signed)

## 2019-08-06 NOTE — Progress Notes (Signed)
Inpatient Rehabilitation Admissions Coordinator  Inpatient rehab consult received. Noted respiratory issues today. I will follow up tomorrow.  Ottie Glazier, RN, MSN Rehab Admissions Coordinator [phone] 08/06/2019 1:15 PM

## 2019-08-06 NOTE — Progress Notes (Signed)
Patient via bed to CT

## 2019-08-06 NOTE — Progress Notes (Signed)
NAME:  Charles Walters, MRN:  [id number], DOB:  November 21, 1960, LOS: 12 ADMISSION DATE:  07/10/2019, CONSULTATION DATE:  07/30/2019 REFERRING MD:  Tawni Millers,*, CHIEF COMPLAINT:  Hypoxemic respiratory failure  Brief History    The patient is a 59 year old gentleman who presented 07/19/2019 with hiccups, loss of appetite, hyponatremia, watery diarrhea.  He was also short of breath on exertion.  He had a grossly abnormal chest x-ray and was found to be incidentally hypoxemic.  He was seen initially by pulmonary critical care team and treated empirically for community-acquired pneumonia.  However over the last week he has continued to decline, had worsening hypoxemia, and had a repeat CT chest which shows persistent bilateral lower lobe predominant groundglass opacities with interlobular septal thickening.  Is being called back to evaluate these opacities in the setting of hypoxemic respiratory failure.  Consults:  PCCM  Procedures:    Significant Diagnostic Tests:    Micro Data:  Cultures no growth to date Respiratory virus panel negative MRSA PCR negative Covid negative Antimicrobials:  Vancomycin and cefepime  Interim history/subjective:  This morning is short of breath.  Desaturating easily. Objective   Blood pressure (!) 138/95, pulse (!) 103, temperature 98.2 F (36.8 C), temperature source Oral, resp. rate (!) 35, height _0  (1.854 m), weight 64.4 kg, SpO2 100 %.        Intake/Output Summary (Last 24 hours) at 08/06/2019 1543 Last data filed at 08/06/2019 1108 Gross per 24 hour  Intake 699.87 ml  Output 725 ml  Net -25.13 ml   Filed Weights   07/09/2019 1319 07/26/19 0000  Weight: 79.4 kg 64.4 kg    Examination: General: ill appearing, on high flow oxygen HENT: dry mucus membranes, temporal wasting Lungs: tachypnic, shallow breathing, poor air entry bilaterally Cardiovascular: tachycardic, regular Extremities: no edema Neuro: good strength, normal speech, moves  all 4 extremities   Assessment & Plan:  Charles Walters is a 59 y.o. man who presented initially on 07/24/2019 with weakness and diarrhea and hyponatremia now found to have progressive hypoxemic respiratory failure   Acute hypoxemic respiratory failure Abnormal CT Chest - concerning for acute interstitial pneumonia  Mr. Sprung has a very abnormal CT chest.  He has not responded to broad-spectrum anti-bacterial therapy.  I do not think this is a run-of-the-mill bacterial pneumonia.  I am very concerned for an acute interstitial pneumonia, and pulmonary alveolar proteinosis certainly on the differential.  I reviewed his lab work which includes a negative ANA, RF, CCP, HP panel, HIV test.  There is no peripheral eosinophilia.  Think bronchoscopy is in order for a BAL to send for cell count, cytology, Gram stain and culture.  I am not sure he be be able to tolerate transbronchial biopsies given his high oxygen demand.  My hope is to go in and out for a quick BAL to get some answers.  BAL with milky lipoprotein would certainly be consistent with pulmonary alveolar proteinosis, but a cell count with eosinophilia for a possible drug reaction would also be useful.  Although less likely since he is immunocompetent fungal infection is in the differential.  I will send for all atypical studies.  In the event of a negative infectious work-up he may require high dose steroid therapy.  We will follow closely.  I have put him on the schedule for bronchoscopy tomorrow morning with anesthesia.  Discussed with Dr. Cathlean Sauer.  Lenice Llamas, MD Pulmonary [facility] [place] Pager: [place]  CBC: Recent Labs  Lab 08/02/19 0216 08/03/19 0915 08/04/19 0228 08/05/19 0212 08/06/19 0123  WBC 11.5* 12.9* 13.4* 16.2* 16.5*  NEUTROABS 9.1* 10.6* 10.8* 12.6* 13.0*  HGB 8.0* 8.1* 7.7* 7.9* 7.5*  HCT 22.0* 23.1* 21.4* 22.6* 21.6*  MCV 88.4 89.5 87.7 90.4 89.3  PLT  232 276 250 278 301    Basic Metabolic Panel: Recent Labs  Lab 07/31/19 0837 07/31/19 0837 08/01/19 0232 08/01/19 0232 08/02/19 0216 08/03/19 0915 08/04/19 0228 08/05/19 0212 08/06/19 0123  NA 128*   < > 130*   < > 130* 130* 132* 132* 135  K 4.0   < > 3.5   < > 3.8 3.6 3.8 3.6 4.3  CL 95*   < > 96*   < > 96* 94* 99 98 101  CO2 27   < > 25   < > _0 GLUCOSE 118*   < > 139*   < > 110* 98 95 125* 100*  BUN 5*   < > 7   < > _1 CREATININE 0.61   < > 0.61   < > 0.65 0.72 0.67 0.62 0.66  CALCIUM 8.3*   < > 8.3*   < > 8.2* 8.3* 8.2* 8.1* 8.2*  MG 1.6*  --  1.7  --   --   --   --   --   --    < > = values in this interval not displayed.   GFR: Estimated Creatinine Clearance: 92.8 mL/min (by C-G formula based on SCr of 0.66 mg/dL). Recent Labs  Lab 08/03/19 0915 08/04/19 0228 08/05/19 0212 08/06/19 0123  WBC 12.9* 13.4* 16.2* 16.5*    Liver Function Tests: Recent Labs  Lab 07/31/19 0837 08/01/19 0232  AST 47* 45*  ALT 42 43  ALKPHOS 58 56  BILITOT 0.8 0.6  PROT 6.2* 5.9*  ALBUMIN 1.7* 1.6*

## 2019-08-06 NOTE — Progress Notes (Signed)
Dr Ella Jubilee aware of patient yellow MEWS score.  Not a change in status.  No need to call Rapid Response at this time.  Patient will go for CT chest shortly

## 2019-08-06 NOTE — Progress Notes (Signed)
PROGRESS NOTE    Biff Rutigliano  HQR:[id number] DOB: March 27, 1961 DOA: Aug 21, 2019 PCP: [name], NP    Brief Narrative:  Patient was admitted to the hospital with a working diagnosis of community-acquired pneumonia complicated by acute hypoxic respiratory failure and severe hyponatremia.Patient with worsening pneumonia suspected multidrug resistant bacteria or acute interstitial pneumonia  59 year old male who presented with hiccups. He does have significant past medical history for hypertension. He reported about 1 week of intractable hiccups associated with loss of appetite, and watery diarrhea. Positive dyspnea on exertion. On his initial physical examination blood pressure 156/119, heart rate 108, respiratory rate 27, oxygen saturation 98%, his lungs had coarse breath sounds bilaterally, no wheezing, heart S1-S2 present and rhythmic, abdomen was soft and nontender, no lower extremity edema. Sodium 114, potassium 3.0, chloride 80, bicarb 17, glucose 126, BUN 7, creatinine 0.77, white count 10.9, hemoglobin 12.3, hematocrit 33.1, platelets 235. SARS COVID-19 negative. Urinary osmolarity 507, urine sodium <10, alcohol less than 10.His chest radiograph had bilateral bibasilar reticulonodular infiltrates, predominantly right base. EKG 128 bpm, normal axis, normal intervals, right atrial enlargement, sinus rhythm, poor R wave progression, LVH, no ST segment T wave changes.  Patient received hypertonic saline and intravenous antibiotics, further work-up with CT chest negative for pulmonary embolism, echocardiography with preserved LV and RV systolic function.  His discharge was cancelled due to worsening hypoxemic respiratory failure, along with worsening lungs radiographic changes,bilateral infiltrates at bases, more dense on the leftbase, . Resumed antibiotic therapy with vancomycin and cefepime, suspected multidrug resistant bacterial pneumonia.  Patient has not improving with  broad spectrum antibiotic therapy, follow CT chest with worsening infiltrates, suspected acute interstitial pneumonia.   Assessment & Plan:   Principal Problem:   Community acquired bacterial pneumonia Active Problems:   Hyponatremia   Intractable hiccups   Diarrhea   Asthenia   Essential hypertension   1.Acute hypoxic respiratory failure due to community-acquired pneumonia (atypical/viral), bilateral.Patient now back to 10 L/ min per HFNC, persistent leukocytosis up to 16.2, his blood culturescontinue withno growth.  Patient has been on antibiotic therapy for 11 days, on broad spectrum antibiotic therapy withIV vancomycin and Cefepime for the last 4 days with no significant improvement.   Patient with persistent hypoxic respiratory failure despite aggressive antibiotic therapy and diuresis. Follow CT personally reviewed, noted worsening interstitial infiltrates possible acute interstitial pneumonia. May need further diagnostics including bronchoscopy or start on systemic steroids. Will call the pulmonary team for recommendations.   Patient continue at a high risk for worsening hypoxic respiratory failure.  2.Hypovolemic hypo-osmolar hyponatremia/ resolved.Stable renal function with serum cr at 0,66 and K at 4,3 with serum bicarbonate at 25. Hold on further diuresis for now.  Continue close follow up of renal function and electrolytes.   3.Hypertension.patient drinks alcohol at home daily, 3 to 4 cigarrettes daily, Continue with metoprolol for blood pressure control, and continue with nicotine patch. On multivitamins and thiamine. No clear signs of withdrawal.   4.Acute diarrhea.resolved.  5.Tobacco abuse.On nicotine patch for smoking cessation.   DVT prophylaxis:enoxaparin Code Status:full Family Communication:no family at the bedside Disposition Plan/ discharge barriers:patient critically ill.   Nutrition Status: Nutrition Problem:  Increased nutrient needs Etiology: acute illness Signs/Symptoms: estimated needs Interventions: Ensure Enlive (each supplement provides 350kcal and 20 grams of protein), MVI     Skin Documentation:     Consultants:   Pulmonary   Antimicrobials:   Levofloxacin dc 02/25  Cefepime and vancomycin start 02/25.    Subjective: Patient back on  10 L/ min per HFNC, continue to have dyspnea, no chest pain, no nausea or vomiting, poor oral intake, no tremors or anxiety.   Objective: Vitals:   08/06/19 0250 08/06/19 0324 08/06/19 0409 08/06/19 0755  BP:  118/79  (!) 135/93  Pulse:  (!) 116 (!) 113 (!) 117  Resp:  (!) 32 14 (!) 24  Temp:  97.7 F (36.5 C)  98.2 F (36.8 C)  TempSrc:  Oral  Oral  SpO2: 100% 95% 95% 93%  Weight:      Height:        Intake/Output Summary (Last 24 hours) at 08/06/2019 0825 Last data filed at 08/06/2019 6962 Gross per 24 hour  Intake 919.87 ml  Output 725 ml  Net 194.87 ml   Filed Weights   07/28/2019 1319 07/26/19 0000  Weight: 79.4 kg 64.4 kg    Examination:   General: Not in pain or dyspnea, deconditioned  Neurology: Awake and alert, non focal  E ENT: mild pallor, no icterus, oral mucosa moist Cardiovascular: No JVD. S1-S2 present, rhythmic, no gallops, rubs, or murmurs. No lower extremity edema. Pulmonary: positive breath sounds bilaterally, no wheezing, bilateral  rhonchi and rales, more left than right.  Gastrointestinal. Abdomen with no organomegaly, non tender, no rebound or guarding Skin. No rashes Musculoskeletal: no joint deformities     Data Reviewed: I have personally reviewed following labs and imaging studies  CBC: Recent Labs  Lab 08/02/19 0216 08/03/19 0915 08/04/19 0228 08/05/19 0212 08/06/19 0123  WBC 11.5* 12.9* 13.4* 16.2* 16.5*  NEUTROABS 9.1* 10.6* 10.8* 12.6* 13.0*  HGB 8.0* 8.1* 7.7* 7.9* 7.5*  HCT 22.0* 23.1* 21.4* 22.6* 21.6*  MCV 88.4 89.5 87.7 90.4 89.3  PLT 232 276 250 278 952   Basic Metabolic  Panel: Recent Labs  Lab 07/31/19 0837 07/31/19 0837 08/01/19 0232 08/01/19 0232 08/02/19 0216 08/03/19 0915 08/04/19 0228 08/05/19 0212 08/06/19 0123  NA 128*   < > 130*   < > 130* 130* 132* 132* 135  K 4.0   < > 3.5   < > 3.8 3.6 3.8 3.6 4.3  CL 95*   < > 96*   < > 96* 94* 99 98 101  CO2 27   < > 25   < > 25 25 23 24 25   GLUCOSE 118*   < > 139*   < > 110* 98 95 125* 100*  BUN 5*   < > 7   < > 6 8 9 12 10   CREATININE 0.61   < > 0.61   < > 0.65 0.72 0.67 0.62 0.66  CALCIUM 8.3*   < > 8.3*   < > 8.2* 8.3* 8.2* 8.1* 8.2*  MG 1.6*  --  1.7  --   --   --   --   --   --    < > = values in this interval not displayed.   GFR: Estimated Creatinine Clearance: 92.8 mL/min (by C-G formula based on SCr of 0.66 mg/dL). Liver Function Tests: Recent Labs  Lab 07/31/19 0837 08/01/19 0232  AST 47* 45*  ALT 42 43  ALKPHOS 58 56  BILITOT 0.8 0.6  PROT 6.2* 5.9*  ALBUMIN 1.7* 1.6*   No results for input(s): LIPASE, AMYLASE in the last 168 hours. No results for input(s): AMMONIA in the last 168 hours. Coagulation Profile: No results for input(s): INR, PROTIME in the last 168 hours. Cardiac Enzymes: No results for input(s): CKTOTAL, CKMB, CKMBINDEX, TROPONINI in the last  168 hours. BNP (last 3 results) No results for input(s): PROBNP in the last 8760 hours. HbA1C: No results for input(s): HGBA1C in the last 72 hours. CBG: No results for input(s): GLUCAP in the last 168 hours. Lipid Profile: No results for input(s): CHOL, HDL, LDLCALC, TRIG, CHOLHDL, LDLDIRECT in the last 72 hours. Thyroid Function Tests: No results for input(s): TSH, T4TOTAL, FREET4, T3FREE, THYROIDAB in the last 72 hours. Anemia Panel: No results for input(s): VITAMINB12, FOLATE, FERRITIN, TIBC, IRON, RETICCTPCT in the last 72 hours.    Radiology Studies: I have reviewed all of the imaging during this hospital visit personally     Scheduled Meds: . enoxaparin (LOVENOX) injection  40 mg Subcutaneous QHS  .  feeding supplement (ENSURE ENLIVE)  237 mL Oral TID BM  . Ipratropium-Albuterol  1 puff Inhalation Q6H  . mouth rinse  15 mL Mouth Rinse BID  . metoprolol tartrate  25 mg Oral BID  . multivitamin with minerals  1 tablet Oral Daily  . nicotine  21 mg Transdermal Daily  . pantoprazole  40 mg Oral Daily  . sodium chloride flush  3 mL Intravenous Once  . thiamine  100 mg Oral Daily   Continuous Infusions: . ceFEPime (MAXIPIME) IV 2 g (08/06/19 8182)  . vancomycin Stopped (08/06/19 0318)     LOS: 12 days        Xaniyah Buchholz Annett Gula, MD

## 2019-08-06 DEATH — deceased

## 2019-08-07 ENCOUNTER — Encounter (HOSPITAL_COMMUNITY): Admission: EM | Disposition: E | Payer: Self-pay | Source: Home / Self Care | Attending: Internal Medicine

## 2019-08-07 ENCOUNTER — Inpatient Hospital Stay (HOSPITAL_COMMUNITY): Payer: Medicaid Other

## 2019-08-07 ENCOUNTER — Encounter (HOSPITAL_COMMUNITY): Payer: Self-pay | Admitting: Internal Medicine

## 2019-08-07 ENCOUNTER — Encounter (HOSPITAL_COMMUNITY): Payer: Self-pay | Admitting: Certified Registered"

## 2019-08-07 DIAGNOSIS — F17210 Nicotine dependence, cigarettes, uncomplicated: Secondary | ICD-10-CM

## 2019-08-07 DIAGNOSIS — D72825 Bandemia: Secondary | ICD-10-CM

## 2019-08-07 DIAGNOSIS — J849 Interstitial pulmonary disease, unspecified: Secondary | ICD-10-CM | POA: Clinically undetermined

## 2019-08-07 DIAGNOSIS — I712 Thoracic aortic aneurysm, without rupture: Secondary | ICD-10-CM

## 2019-08-07 DIAGNOSIS — J9601 Acute respiratory failure with hypoxia: Secondary | ICD-10-CM | POA: Diagnosis present

## 2019-08-07 DIAGNOSIS — G9341 Metabolic encephalopathy: Secondary | ICD-10-CM

## 2019-08-07 DIAGNOSIS — A419 Sepsis, unspecified organism: Secondary | ICD-10-CM

## 2019-08-07 DIAGNOSIS — R634 Abnormal weight loss: Secondary | ICD-10-CM

## 2019-08-07 DIAGNOSIS — E43 Unspecified severe protein-calorie malnutrition: Secondary | ICD-10-CM

## 2019-08-07 DIAGNOSIS — R6520 Severe sepsis without septic shock: Secondary | ICD-10-CM

## 2019-08-07 LAB — BLOOD GAS, ARTERIAL
Acid-Base Excess: 1.2 mmol/L (ref 0.0–2.0)
Bicarbonate: 24.2 mmol/L (ref 20.0–28.0)
FIO2: 100
O2 Saturation: 92.7 %
Patient temperature: 37
pCO2 arterial: 31.9 mmHg — ABNORMAL LOW (ref 32.0–48.0)
pH, Arterial: 7.493 — ABNORMAL HIGH (ref 7.350–7.450)
pO2, Arterial: 69.2 mmHg — ABNORMAL LOW (ref 83.0–108.0)

## 2019-08-07 LAB — CULTURE, BLOOD (ROUTINE X 2)
Culture: NO GROWTH
Culture: NO GROWTH
Special Requests: ADEQUATE
Special Requests: ADEQUATE

## 2019-08-07 LAB — CBC WITH DIFFERENTIAL/PLATELET
Abs Immature Granulocytes: 0.2 10*3/uL — ABNORMAL HIGH (ref 0.00–0.07)
Basophils Absolute: 0 10*3/uL (ref 0.0–0.1)
Basophils Relative: 0 %
Eosinophils Absolute: 0 10*3/uL (ref 0.0–0.5)
Eosinophils Relative: 0 %
HCT: 24.6 % — ABNORMAL LOW (ref 39.0–52.0)
Hemoglobin: 8.6 g/dL — ABNORMAL LOW (ref 13.0–17.0)
Immature Granulocytes: 1 %
Lymphocytes Relative: 6 %
Lymphs Abs: 1.3 10*3/uL (ref 0.7–4.0)
MCH: 31.6 pg (ref 26.0–34.0)
MCHC: 35 g/dL (ref 30.0–36.0)
MCV: 90.4 fL (ref 80.0–100.0)
Monocytes Absolute: 2.3 10*3/uL — ABNORMAL HIGH (ref 0.1–1.0)
Monocytes Relative: 10 %
Neutro Abs: 19.9 10*3/uL — ABNORMAL HIGH (ref 1.7–7.7)
Neutrophils Relative %: 83 %
Platelets: 282 10*3/uL (ref 150–400)
RBC: 2.72 MIL/uL — ABNORMAL LOW (ref 4.22–5.81)
RDW: 12.7 % (ref 11.5–15.5)
WBC: 23.7 10*3/uL — ABNORMAL HIGH (ref 4.0–10.5)
nRBC: 0 % (ref 0.0–0.2)

## 2019-08-07 LAB — BASIC METABOLIC PANEL
Anion gap: 11 (ref 5–15)
BUN: 11 mg/dL (ref 6–20)
CO2: 22 mmol/L (ref 22–32)
Calcium: 8.3 mg/dL — ABNORMAL LOW (ref 8.9–10.3)
Chloride: 102 mmol/L (ref 98–111)
Creatinine, Ser: 0.67 mg/dL (ref 0.61–1.24)
GFR calc Af Amer: >60 mL/min (ref >60)
GFR calc non Af Amer: >60 mL/min (ref >60)
Glucose, Bld: 98 mg/dL (ref 70–99)
Potassium: 4.2 mmol/L (ref 3.5–5.1)
Sodium: 135 mmol/L (ref 135–145)

## 2019-08-07 LAB — MRSA PCR SCREENING: MRSA by PCR: NEGATIVE

## 2019-08-07 SURGERY — CANCELLED PROCEDURE

## 2019-08-07 MED ORDER — WHITE PETROLATUM EX OINT
TOPICAL_OINTMENT | CUTANEOUS | Status: AC
  Administered 2019-08-07: 0.2 via TOPICAL
  Filled 2019-08-07: qty 28.35

## 2019-08-07 MED ORDER — CHLORHEXIDINE GLUCONATE CLOTH 2 % EX PADS
6.0000 | MEDICATED_PAD | Freq: Every day | CUTANEOUS | Status: DC
  Administered 2019-08-07 – 2019-08-17 (×12): 6 via TOPICAL

## 2019-08-07 MED ORDER — METHYLPREDNISOLONE SODIUM SUCC 125 MG IJ SOLR
60.0000 mg | INTRAMUSCULAR | Status: DC
  Administered 2019-08-08 – 2019-08-14 (×7): 60 mg via INTRAVENOUS
  Filled 2019-08-07 (×7): qty 2

## 2019-08-07 MED ORDER — WHITE PETROLATUM EX OINT
TOPICAL_OINTMENT | CUTANEOUS | Status: DC | PRN
  Administered 2019-08-13: 0.2 via TOPICAL

## 2019-08-07 MED ORDER — FENTANYL CITRATE (PF) 100 MCG/2ML IJ SOLN
INTRAMUSCULAR | Status: AC
  Filled 2019-08-07: qty 2

## 2019-08-07 MED ORDER — PANTOPRAZOLE SODIUM 40 MG IV SOLR
40.0000 mg | INTRAVENOUS | Status: DC
  Administered 2019-08-07 – 2019-08-17 (×11): 40 mg via INTRAVENOUS
  Filled 2019-08-07 (×11): qty 40

## 2019-08-07 MED ORDER — SULFAMETHOXAZOLE-TRIMETHOPRIM 400-80 MG/5ML IV SOLN
320.0000 mg | Freq: Three times a day (TID) | INTRAVENOUS | Status: DC
  Administered 2019-08-07: 320 mg via INTRAVENOUS
  Filled 2019-08-07 (×3): qty 20

## 2019-08-07 MED ORDER — METHYLPREDNISOLONE SODIUM SUCC 125 MG IJ SOLR: 60.0000 mg | Freq: Every day | INTRAMUSCULAR | Status: DC

## 2019-08-07 MED ORDER — ORAL CARE MOUTH RINSE
15.0000 mL | Freq: Two times a day (BID) | OROMUCOSAL | Status: DC
  Administered 2019-08-07 – 2019-08-08 (×4): 15 mL via OROMUCOSAL

## 2019-08-07 MED ORDER — METHYLPREDNISOLONE SODIUM SUCC 125 MG IJ SOLR: 60.0000 mg | Freq: Four times a day (QID) | INTRAMUSCULAR | Status: DC

## 2019-08-07 MED ORDER — SODIUM CHLORIDE 0.9 % IV SOLN
500.0000 mg | INTRAVENOUS | Status: DC
  Administered 2019-08-07: 11:00:00 500 mg via INTRAVENOUS
  Filled 2019-08-07: qty 500

## 2019-08-07 MED ORDER — FUROSEMIDE 10 MG/ML IJ SOLN
40.0000 mg | Freq: Once | INTRAMUSCULAR | Status: AC
  Administered 2019-08-07: 05:00:00 40 mg via INTRAVENOUS

## 2019-08-07 MED ORDER — CHLORHEXIDINE GLUCONATE 0.12 % MT SOLN
15.0000 mL | Freq: Two times a day (BID) | OROMUCOSAL | Status: DC
  Administered 2019-08-07 – 2019-08-10 (×7): 15 mL via OROMUCOSAL
  Filled 2019-08-07 (×4): qty 15

## 2019-08-07 MED ORDER — FUROSEMIDE 10 MG/ML IJ SOLN
INTRAMUSCULAR | Status: AC
  Filled 2019-08-07: qty 4

## 2019-08-07 MED ORDER — MIDAZOLAM HCL 2 MG/2ML IJ SOLN
INTRAMUSCULAR | Status: AC
  Filled 2019-08-07: qty 4

## 2019-08-07 MED ORDER — THIAMINE HCL 100 MG/ML IJ SOLN
100.0000 mg | Freq: Every day | INTRAMUSCULAR | Status: DC
  Administered 2019-08-07 – 2019-08-17 (×11): 100 mg via INTRAVENOUS
  Filled 2019-08-07 (×11): qty 2

## 2019-08-07 NOTE — Progress Notes (Signed)
Upon assessment at beginning of shift pt tachypnic with increased work of breathing on 15L HFNC. Pt did not want to wear NRB but RN was able to get pt to keep it near his mouth. Pt afebrile, HR 120's R 30's-40's. Pt was shaking and had an incontinent episode which is not normal for pt. RN called rapid response and paged TR[facility] on  call. Rapid response came to see pt. Pt stated that he felt that he was having a panic attack. On call [facility] ordered PRN metoprolol and ativan and stated they would come to see pt. RN spoke with respiratory regarding an order for heated high flow O2 but at the time the pt was comfortable and tolerating the HFNC and NRB. Throughout the night pt O2 sats 80's-90's with with increased work of breathing and respirations in the 40's. RN spoke with respiratory therapy again when pt was more diaphoretic and respirations were maintaining in the 40's. Respiratory placed pt on heated high flow, called rapid response and on call critical care. Critical care recommended transfer to ICU. Pt transferred to 2H13 with belongings.

## 2019-08-07 NOTE — Progress Notes (Signed)
PT Cancellation Note  Patient Details Name: Charles Walters MRN: [id number] DOB: 02/05/1961   Cancelled Treatment:    Reason Eval/Treat Not Completed: Patient not medically ready;Medical issues which prohibited therapy Pt not appropriate for therapy today due to requiring high amount of 02 and barely maintaining sats in low 90s. Will follow.   Charles Walters 08/24/2019, 11:49 AM [place], PT, DPT Acute Rehabilitation Services Pager [phone] Office [phone]   \

## 2019-08-07 NOTE — Progress Notes (Signed)
PROGRESS NOTE    Charles Walters  KXF:[id number] DOB: 1961/03/02 DOA: 2019/08/23 PCP: Marliss Coots, NP    Brief Narrative:  Patient was admitted to the hospital with a working diagnosis of community-acquired pneumonia complicated by acute hypoxic respiratory failure and severe hyponatremia.Patient with worsening pneumonia suspected multidrug resistant bacteria/ atypical pneumonia or acute interstitial pneumonia  59 year old male who presented with hiccups. He does have significant past medical history for hypertension. He reported about 1 week of intractable hiccups associated with loss of appetite, and watery diarrhea. Positive dyspnea on exertion. On his initial physical examination blood pressure 156/119, heart rate 108, respiratory rate 27, oxygen saturation 98% on supplemental oxygen, his lungs had coarse breath sounds bilaterally, no wheezing, heart S1-S2 present and rhythmic, abdomen was soft and nontender, no lower extremity edema. Sodium 114, potassium 3.0, chloride 80, bicarb 17, glucose 126, BUN 7, creatinine 0.77, white count 10.9, hemoglobin 12.3, hematocrit 33.1, platelets 235. SARS COVID-19 negative. Urinary osmolarity 507, urine sodium <10, alcohol less than 10.His chest radiograph had bilateral bibasilar reticulonodular infiltrates, predominantly right base. EKG 128 bpm, normal axis, normal intervals, right atrial enlargement, sinus rhythm, poor R wave progression, LVH, no ST segment T wave changes.  Patient received hypertonic saline and intravenous antibiotics, further work-up with CT chest negative for pulmonary embolism, echocardiography with preserved LV and RV systolic function.  His discharge was cancelled due to worsening hypoxemic respiratory failure, along with worsening pulmonary radiographic changes,bilateral infiltrates at bases, more dense on the leftbase, . Resumed antibiotic therapy with vancomycin and cefepime, suspected multidrug resistant bacterial  pneumonia/ atypical pneumonia or acute interstitial process.  Patient with rapid worsening respiratory failure despite broad spectrum antibiotic therapy, follow CT chest with worsening infiltrates, suspected acute interstitial pneumonia/ pneumocystis.   03/02 Transferred to ICU for heated high flow nasal cannula therapy.      Assessment & Plan:   Principal Problem:   Acute respiratory failure with hypoxia (HCC) Active Problems:   Hyponatremia   Intractable hiccups   Diarrhea   Asthenia   Essential hypertension   Community acquired bacterial pneumonia   Acute interstitial pneumonia ([place])   1.Acute hypoxic respiratory failure due to community-acquired pneumonia (atypical/viral), bilateral.Worsening hypoxic respiratory failure, now on heated high flow nasal cannulat with 50 L/ min with 100% Fi02, oxygen saturation is 100%, continue to have high respiratory rate 37 and feeling fatigue.   ABG: 7.49, 31.9, 69.2, 24, 92%. 100% Fi02. Continue worsening leukocytosis now up to 23,7. Cultures continue with no growth. Follow up chest film personally reviewed noted bilateral patchy infiltrates, reticulonodular, mid right lung, left lower and left upper lobe, peripheral predominance.   Continue broad spectrum antibiotic therapy with antibiotic IV cefepime,  vancomycin and bactrim. Patient may need systemic corticosteroids, he has very high oxygen requirements, may not be a good candidate for bronchoscopy at this point. Hold on diuresis this am.  High risk to require invasive mechanical ventilation.   2.Hypovolemic hypo-osmolar hyponatremia/ resolved.Renal function with serum cr at 0,67and K at 4,2 with serum bicarbonate at 22.Urine output over last 24 H is 875 ml. Continue close follow up of renal function and electrolytes, avoid hypotension and nephrotoxic medications.   3.Hypertension. Blood pressure has remain stable 134/92. will contnue with metoprolol   4. Tobacco abuse/  alcohol use. patient drinks alcohol at home daily one beer,and smokes 3 to 4 cigarrettes daily. Continue with nicotine patch. Continue multivitamins and thiamine. No clear signs of active withdrawal.  5.Acute diarrhea.resolved.  DVT prophylaxis: enoxaparin  Code Status:  full Family Communication: no family at the bedside   Patient is critically will with severe hypoxic respiratory failure, will continue supportive medical therapy, may need invasive mechanical ventilation.   Critical care time  35 minutes.    Nutrition Status: Nutrition Problem: Increased nutrient needs Etiology: acute illness Signs/Symptoms: estimated needs Interventions: Ensure Enlive (each supplement provides 350kcal and 20 grams of protein), MVI    Consultants:   Pulmonary    Antimicrobials:   Levofloxacin  Dc 02/25  Cefepime/ vancomycin start 02/25  Bactrim 03/02   Subjective: Patient is somnolent but easy to arouse, follows commands and responds to simple questions, reports feeling fatigue. No chest pain, no nausea or vomiting. Persistent dyspnea that he qualifies as moderate and persistent worse with movement and improved with supplemental oxygen.   Objective: Vitals:   2019-08-14 0530 Aug 14, 2019 0600 08/14/2019 0630 08/14/19 0700  BP: (!) 133/94 (!) 140/91 (!) 135/93 (!) 134/92  Pulse: (!) 125 (!) 125 (!) 123 (!) 146  Resp: (!) 43 (!) 41 (!) 37 (!) 37  Temp:      TempSrc:      SpO2: 96% 95%  100%  Weight:      Height:        Intake/Output Summary (Last 24 hours) at 14-Aug-2019 0800 Last data filed at Aug 14, 2019 0705 Gross per 24 hour  Intake 100 ml  Output 1050 ml  Net -950 ml   Filed Weights   07/10/2019 1319 07/26/19 0000 2019-08-14 0459  Weight: 79.4 kg 64.4 kg 59.8 kg    Examination:   General: deconditioned and ill looking appearing.  Neurology: Somnolent but easy to arouse, non focal  E ENT: mild pallor, no icterus, oral mucosa moist Cardiovascular: No JVD. S1-S2 present,  rhythmic, no gallops, rubs, or murmurs. No lower extremity edema. Pulmonary: positive breath sounds bilaterally, decreased air movement, no wheezing, positive bilateral rhonchi and rales. Gastrointestinal. Abdomen with, no organomegaly, non tender, no rebound or guarding Skin. No rashes Musculoskeletal: no joint deformities     Data Reviewed: I have personally reviewed following labs and imaging studies  CBC: Recent Labs  Lab 08/03/19 0915 08/04/19 0228 08/05/19 0212 08/06/19 0123 08/14/19 0131  WBC 12.9* 13.4* 16.2* 16.5* 23.7*  NEUTROABS 10.6* 10.8* 12.6* 13.0* 19.9*  HGB 8.1* 7.7* 7.9* 7.5* 8.6*  HCT 23.1* 21.4* 22.6* 21.6* 24.6*  MCV 89.5 87.7 90.4 89.3 90.4  PLT 276 250 278 272 282   Basic Metabolic Panel: Recent Labs  Lab 07/31/19 0837 07/31/19 0837 08/01/19 0232 08/02/19 0216 08/03/19 0915 08/04/19 0228 08/05/19 0212 08/06/19 0123 14-Aug-2019 0131  NA 128*   < > 130*   < > 130* 132* 132* 135 135  K 4.0   < > 3.5   < > 3.6 3.8 3.6 4.3 4.2  CL 95*   < > 96*   < > 94* 99 98 101 102  CO2 27   < > 25   < > 25 23 24 25 22   GLUCOSE 118*   < > 139*   < > 98 95 125* 100* 98  BUN 5*   < > 7   < > 8 9 12 10 11   CREATININE 0.61   < > 0.61   < > 0.72 0.67 0.62 0.66 0.67  CALCIUM 8.3*   < > 8.3*   < > 8.3* 8.2* 8.1* 8.2* 8.3*  MG 1.6*  --  1.7  --   --   --   --   --   --    < > =  values in this interval not displayed.   GFR: Estimated Creatinine Clearance: 86.2 mL/min (by C-G formula based on SCr of 0.67 mg/dL). Liver Function Tests: Recent Labs  Lab 07/31/19 0837 08/01/19 0232  AST 47* 45*  ALT 42 43  ALKPHOS 58 56  BILITOT 0.8 0.6  PROT 6.2* 5.9*  ALBUMIN 1.7* 1.6*   No results for input(s): LIPASE, AMYLASE in the last 168 hours. No results for input(s): AMMONIA in the last 168 hours. Coagulation Profile: No results for input(s): INR, PROTIME in the last 168 hours. Cardiac Enzymes: No results for input(s): CKTOTAL, CKMB, CKMBINDEX, TROPONINI in the last  168 hours. BNP (last 3 results) No results for input(s): PROBNP in the last 8760 hours. HbA1C: No results for input(s): HGBA1C in the last 72 hours. CBG: No results for input(s): GLUCAP in the last 168 hours. Lipid Profile: No results for input(s): CHOL, HDL, LDLCALC, TRIG, CHOLHDL, LDLDIRECT in the last 72 hours. Thyroid Function Tests: No results for input(s): TSH, T4TOTAL, FREET4, T3FREE, THYROIDAB in the last 72 hours. Anemia Panel: No results for input(s): VITAMINB12, FOLATE, FERRITIN, TIBC, IRON, RETICCTPCT in the last 72 hours.    Radiology Studies: I have reviewed all of the imaging during this hospital visit personally     Scheduled Meds: . chlorhexidine  15 mL Mouth Rinse BID  . Chlorhexidine Gluconate Cloth  6 each Topical Daily  . enoxaparin (LOVENOX) injection  40 mg Subcutaneous QHS  . feeding supplement (ENSURE ENLIVE)  237 mL Oral TID BM  . Ipratropium-Albuterol  1 puff Inhalation Q6H  . mouth rinse  15 mL Mouth Rinse BID  . mouth rinse  15 mL Mouth Rinse q12n4p  . metoprolol tartrate  25 mg Oral BID  . multivitamin with minerals  1 tablet Oral Daily  . nicotine  21 mg Transdermal Daily  . pantoprazole  40 mg Oral Daily  . sodium chloride flush  3 mL Intravenous Once  . thiamine  100 mg Oral Daily   Continuous Infusions: . ceFEPime (MAXIPIME) IV 2 g (08/18/2019 0548)  . sulfamethoxazole-trimethoprim 320 mg (09/04/2019 0705)  . vancomycin 1,000 mg (08/28/2019 0206)     LOS: 13 days        Stefan Karen Annett Gula, MD

## 2019-08-07 NOTE — Progress Notes (Signed)
Person who called and said she was a daughter Zoila Shutter is really a girlfriend.  Patient states he has 3 daughters has not spoken to them in some time. He has a brother in [address] names Mathis Fare and a Brother in [place] named Casimiro Needle he has no numbers, they are in his phone at home.

## 2019-08-07 NOTE — Progress Notes (Signed)
Spoke to patient this morning about next of kin He states he has a brother who lives in [place]. He lives in [place] and works for a moving company. In assessing Charles Walters mentation today, it is felt by the MD that he would not be able to consent for procedures. Daughter called- number [phone]

## 2019-08-07 NOTE — Progress Notes (Signed)
Admitted to 2H13 as transfer from [facility] for respiratory distress.   Alert, oriented, laboured breathing, Sats 93% on HFNC 5 Lpm and 100%. Tachypneic 40s, Dyspnea at rest. Fine bibasilar crackles. HR 120s.   CCM ground team (MD and NP) at bedside, plan for Lasix 40 mg IV, chest xray. Monitoring closely for decompensation and possible intubation.   Plan explained to pt, verbalized understanding.

## 2019-08-07 NOTE — Progress Notes (Signed)
NAME:  Charles Walters, MRN:  [id number], DOB:  1960/07/21, LOS: 70 ADMISSION DATE:  August 13, 2019, CONSULTATION DATE:  07/30/2019 REFERRING MD:  Tawni Millers,*, CHIEF COMPLAINT:  Hypoxemic respiratory failure  Brief History    The patient is a 59 year old gentleman who presented 08/13/19 with hiccups, loss of appetite, hyponatremia, watery diarrhea.  He was also short of breath on exertion.  He had a grossly abnormal chest x-ray and was found to be incidentally hypoxemic.  He was seen initially by pulmonary critical care team and treated empirically for community-acquired pneumonia.  However over the last week he has continued to decline, had worsening hypoxemia, and had a repeat CT chest which shows persistent bilateral lower lobe predominant groundglass opacities with interlobular septal thickening.  Is being called back to evaluate these opacities in the setting of hypoxemic respiratory failure.  2/22 pulmonary consulted - recommended CAP coverage, outpatient follow up.  3/1 pulmonary reconsulted for persistent infiltrates and hypoxemia. Planned for bronch 3/2 morning - transferred to ICU, bronch cancelled for worsening O2 requirements.   Consults:  PCCM  Procedures:    Significant Diagnostic Tests:    Micro Data:  Cultures no growth to date Respiratory virus panel negative MRSA PCR negative Covid negative Antimicrobials:  Vancomycin and cefepime  Interim history/subjective:  This morning delirious, more tachypnic and tacycardic, desaturations requiring high flow oxygen. Transferred to ICU.  Denies pain.  Objective   Blood pressure (!) 134/92, pulse (!) 146, temperature 98.5 F (36.9 C), temperature source Oral, resp. rate (!) 37, height 6\' 1"  (1.854 m), weight 59.8 kg, SpO2 100 %.    FiO2 (%):  [100 %] 100 %   Intake/Output Summary (Last 24 hours) at 08/26/2019 0820 Last data filed at 08/16/2019 0705 Gross per 24 hour  Intake 100 ml  Output 925 ml  Net -825 ml    Filed Weights   2019/08/13 1319 07/26/19 0000 09/03/2019 0459  Weight: 79.4 kg 64.4 kg 59.8 kg    Examination: General: ill appearing, cachectic on high flow oxygen HENT: dry mucus membranes, temporal wasting Lungs: tachypnic, shallow breathing, poor air entry bilaterally Cardiovascular: tachycardic, regular Extremities: no edema Neuro: good strength, normal speech, moves all 4 extremities. Not oriented to time or situation.    Assessment & Plan:  Charles Walters is a 59 y.o. man who presented initially on August 13, 2019 with weakness and diarrhea and hyponatremia now found to have progressive hypoxemic respiratory failure   Acute hypoxemic respiratory failure likely secondary to: Abnormal CT Chest - concerning for acute interstitial pneumonia  Clinically worsened overnight. He is delirious and not consentable for bronchoscopy.  No family listed in chart, only a friend.  I do not think he would tolerated at this point safely. Started empirically on bactrim overnight. Still on vancomycin and cefepime - today is day 5. Will stop after day. At this point he is approaching impending intubation given concern for infectious cause with fevers and symptoms below, will hold on steroids if no improvement with below.   Acute metabolic encephalopathy - likely secondary to delirium and respiratory failure  management as above  Severe protein calorie malnutrition Body mass index is 17.39 kg/m.  Leukocytosis with bandemia Has been having fevers since 2/22 although these are more low grade now.  Cannot rule out sepsis Continue broad spectrum abx as above. On cefepime, vancomycin, bactrim. Will add azithromycin for legionella.  Will send urine legionella given initial presentation with fever, dyspnea, hyponatremia diarrhea now anemia.   Acute Anemia - came  in with Hgb of 12.3 and is now down to 8.6.  - differential includes acute illness, cannot exclude that this is related to his pulmonary infiltrates.   - will send peripheral blood smear to be reviewed by pathology   The patient is critically ill with multiple organ systems failure and requires high complexity decision making for assessment and support, frequent evaluation and titration of therapies, application of advanced monitoring technologies and extensive interpretation of multiple databases.   Critical Care Time devoted to patient care services described in this note is 45  minutes. This time reflects time of care of this signee Charlott Holler . This critical care time does not reflect separately billable procedures or procedure time, teaching time or supervisory time of PA/NP/Med student/Med Resident etc but could involve care discussion time.  Mickel Baas Pulmonary and Critical Care Medicine 08/16/2019 8:29 AM  Pager: [phone] After hours pager: [phone]  Best practice:  Diet: regular diet - he is taking in very little po. Pain/Anxiety/Delirium protocol (if indicated): n/a VAP protocol (if indicated):n/a DVT prophylaxis: lovenox GI prophylaxis: n/a Glucose control: monitor CBG Foley condom cath Mobility: bed rest Code Status: Full Family Communication: patient reports a brother named Mathis Fare in [place]. Will attempt to obtain contact information Disposition: needs ICU  Labs   CBC: Recent Labs  Lab 08/03/19 0915 08/04/19 0228 08/05/19 0212 08/06/19 0123 08/21/2019 0131  WBC 12.9* 13.4* 16.2* 16.5* 23.7*  NEUTROABS 10.6* 10.8* 12.6* 13.0* 19.9*  HGB 8.1* 7.7* 7.9* 7.5* 8.6*  HCT 23.1* 21.4* 22.6* 21.6* 24.6*  MCV 89.5 87.7 90.4 89.3 90.4  PLT 276 250 278 272 282    Basic Metabolic Panel: Recent Labs  Lab 07/31/19 0837 07/31/19 0837 08/01/19 0232 08/02/19 0216 08/03/19 0915 08/04/19 0228 08/05/19 0212 08/06/19 0123 09/04/2019 0131  NA 128*   < > 130*   < > 130* 132* 132* 135 135  K 4.0   < > 3.5   < > 3.6 3.8 3.6 4.3 4.2  CL 95*   < > 96*   < > 94* 99 98 101 102  CO2 27   < > 25   < > 25 23 24  25 22   GLUCOSE 118*   < > 139*   < > 98 95 125* 100* 98  BUN 5*   < > 7   < > 8 9 12 10 11   CREATININE 0.61   < > 0.61   < > 0.72 0.67 0.62 0.66 0.67  CALCIUM 8.3*   < > 8.3*   < > 8.3* 8.2* 8.1* 8.2* 8.3*  MG 1.6*  --  1.7  --   --   --   --   --   --    < > = values in this interval not displayed.   GFR: Estimated Creatinine Clearance: 86.2 mL/min (by C-G formula based on SCr of 0.67 mg/dL). Recent Labs  Lab 08/04/19 0228 08/05/19 0212 08/06/19 0123 08/30/2019 0131  WBC 13.4* 16.2* 16.5* 23.7*    Liver Function Tests: Recent Labs  Lab 07/31/19 0837 08/01/19 0232  AST 47* 45*  ALT 42 43  ALKPHOS 58 56  BILITOT 0.8 0.6  PROT 6.2* 5.9*  ALBUMIN 1.7* 1.6*

## 2019-08-07 NOTE — Progress Notes (Addendum)
Noted ID recommendations, patient continue to have significant hypoxic respiratory failure, with very high oxygen requirements. Not likely a bacterial infection.   After consideration of risk vs benefit, will start patient on systemic steroids.

## 2019-08-07 NOTE — Progress Notes (Signed)
NAME:  Charles Walters, MRN:  [id number], DOB:  1961/04/29, LOS: 13 ADMISSION DATE:  07/15/2019, CONSULTATION DATE:  07/30/2019 REFERRING MD:  Coralie Keens,*, CHIEF COMPLAINT:  Hypoxemic respiratory failure  Brief History    The patient is a 59 year old gentleman who presented 07/09/2019 with hiccups, loss of appetite, hyponatremia, watery diarrhea.  He was also short of breath on exertion.  He had a grossly abnormal chest x-ray and was found to be incidentally hypoxemic.  He was seen initially by pulmonary critical care team and treated empirically for community-acquired pneumonia.  However over the last week he has continued to decline, had worsening hypoxemia, and had a repeat CT chest which shows persistent bilateral lower lobe predominant groundglass opacities with interlobular septal thickening.  Is being called back to evaluate these opacities in the setting of hypoxemic respiratory failure.  Consults:  PCCM  Procedures:    Significant Diagnostic Tests:    Micro Data:  Cultures no growth to date Respiratory virus panel negative MRSA PCR negative Covid negative  Antimicrobials:  Vancomycin and cefepime  Interim history/subjective:  Called tonight for [name] decompensating respiratory status. His O2 saturations and tachypnea have been worsening throughout the day. His supplemental O2 has been increased to 100% FiO2 at 40lpm with sats maintaining in the low 90s. His mentation has also declined and he is significantly less responsive than he was earlier in the shift.   Objective   Blood pressure 119/78, pulse (!) 114, temperature 99.6 F (37.6 C), temperature source Oral, resp. rate (!) 22, height 6\' 1"  (1.854 m), weight 64.4 kg, SpO2 (!) 39 %.        Intake/Output Summary (Last 24 hours) at 09/03/2019 0411 Last data filed at 08/06/2019 2140 Gross per 24 hour  Intake --  Output 375 ml  Net -375 ml   Filed Weights   07/21/2019 1319 07/26/19 0000  Weight: 79.4 kg  64.4 kg    Examination:  General: Frail middle aged male in NAD HENT: [place]/AT, PERRL, no JVD. Bitemporal wasting.  Lungs: Rapid shallow breathing with rates 40s. Coarse bilateral breath sounds Cardiovascular: tachycardic, regular Extremities: no edema Neuro: Lethargic. One word answers. Not opening eyes.   Assessment & Plan:   Acute hypoxemic respiratory failure: etiology unclear. At this point he has been inpatient for almost 2 weeks and has failed conventional treatment with antibiotics and diuretics. Autoimmune panel also negative. Ddx now includes acute interstitial pneumonia and pulmonary alveolar proteinosis.  Now requiring HFNC 100% at 40lpm with worsening mentation.  - Transfer to ICU - I worry he will require intubation - If he is intubated would proceed with FOB for today as scheduled, otherwise I doubt he will tolerate. - Trial of lasix - Continue empiric antibiotics   Acute metabolic encephalopathy: presumably in the setting of hypoxia.  - Support breathing as above  HTN - Metoprolol  Alcohol abuse - Thiamine    Labs   CBC: Recent Labs  Lab 08/03/19 0915 08/04/19 0228 08/05/19 0212 08/06/19 0123 08/12/2019 0131  WBC 12.9* 13.4* 16.2* 16.5* 23.7*  NEUTROABS 10.6* 10.8* 12.6* 13.0* 19.9*  HGB 8.1* 7.7* 7.9* 7.5* 8.6*  HCT 23.1* 21.4* 22.6* 21.6* 24.6*  MCV 89.5 87.7 90.4 89.3 90.4  PLT 276 250 278 272 282    Basic Metabolic Panel: Recent Labs  Lab 07/31/19 0837 07/31/19 0837 08/01/19 0232 08/02/19 0216 08/03/19 0915 08/04/19 0228 08/05/19 0212 08/06/19 0123 08/24/2019 0131  NA 128*   < > 130*   < >  130* 132* 132* 135 135  K 4.0   < > 3.5   < > 3.6 3.8 3.6 4.3 4.2  CL 95*   < > 96*   < > 94* 99 98 101 102  CO2 27   < > 25   < > 25 23 24 25 22   GLUCOSE 118*   < > 139*   < > 98 95 125* 100* 98  BUN 5*   < > 7   < > 8 9 12 10 11   CREATININE 0.61   < > 0.61   < > 0.72 0.67 0.62 0.66 0.67  CALCIUM 8.3*   < > 8.3*   < > 8.3* 8.2* 8.1* 8.2* 8.3*  MG  1.6*  --  1.7  --   --   --   --   --   --    < > = values in this interval not displayed.   GFR: Estimated Creatinine Clearance: 92.8 mL/min (by C-G formula based on SCr of 0.67 mg/dL). Recent Labs  Lab 08/04/19 0228 08/05/19 0212 08/06/19 0123 08/19/2019 0131  WBC 13.4* 16.2* 16.5* 23.7*    Liver Function Tests: Recent Labs  Lab 07/31/19 0837 08/01/19 0232  AST 47* 45*  ALT 42 43  ALKPHOS 58 56  BILITOT 0.8 0.6  PROT 6.2* 5.9*  ALBUMIN 1.7* 1.6*    Georgann Housekeeper, AGACNP-BC [place]  See Amion for personal pager PCCM on call pager [phone]  August 19, 2019 4:11 AM

## 2019-08-07 NOTE — Progress Notes (Signed)
Pharmacy Antibiotic Note  Charles Walters is a 59 y.o. male with respiratory failure and PNA.  Pharmacy has been consulted for  Bactrim dosing.  Plan: Bactrim 320 mg IV q8h  Height: 6\' 1"  (185.4 cm) Weight: 131 lb 13.4 oz (59.8 kg) IBW/kg (Calculated) : 79.9  Temp (24hrs), Avg:98.4 F (36.9 C), Min:98 F (36.7 C), Max:99.6 F (37.6 C)  Recent Labs  Lab 08/03/19 0915 08/04/19 0228 08/05/19 0212 08/05/19 1614 08/06/19 0123 08/19/2019 0131  WBC 12.9* 13.4* 16.2*  --  16.5* 23.7*  CREATININE 0.72 0.67 0.62  --  0.66 0.67  VANCOTROUGH  --   --   --  8*  --   --     Estimated Creatinine Clearance: 86.2 mL/min (by C-G formula based on SCr of 0.67 mg/dL).    No Known Allergies  Antimicrobials this admission: CTX 2/17 >2/18 Azithromycin 2/17 > 2/18 Vanc 2/18>> 2/19; 2/22-2/24; 2/25 >> Cefepime 2/18>>2/20; 2/25 >> LVQ 2/20>2/25 Bactrim 3/2 >>    08-26-1997 08/27/2019 5:31 AM

## 2019-08-07 NOTE — Progress Notes (Signed)
Placed patient on heated high flow nasal cannula set at 40LPM and 100%. Patients Sp02=93-97% with respiratory rate 33-40. Arterial blood gas obtained and CCM notified and at bedside.

## 2019-08-07 NOTE — Progress Notes (Signed)
OT Cancellation Note  Patient Details Name: Charles Walters MRN: [id number] DOB: 1960-10-28   Cancelled Treatment:    Reason Eval/Treat Not Completed: Patient not medically ready;Medical issues which prohibited therapy(Pt on hi flow O2, unable to tolerate therapy at this time.)   Flora Lipps, OTR/L Acute Rehabilitation Services Pager: [phone] Office: [phone]   [name] 09-06-19, 11:50 AM

## 2019-08-07 NOTE — Consult Note (Signed)
[facility]    Date of Admission:  07/11/2019      Total days of antibiotics 13  Bactrim + Azithromycin                Reason for Consult: Hypoxia    Referring Provider: Dr. Cathlean Sauer  Primary Care Provider: Synthia Innocent Audrea Muscat, NP    Assessment: Charles Walters is a 59 y.o. male with worsening hypoxia and pulmonary status. CT scan with an interesting peripheral crazy paving pattern throughout the peripheral lungs bilaterally but involving all lobes that appears to spare central lungs. Considering atypical infectious causes the 5d of Levaquin would have covered for legionella; oninterview and review of his chart he does not seem to have any risk factors for pneumocystis and CBC diff indicates his CD4 cells likely preserved. No mention of any abscess formation. Unclear at this time what is the etiology of worsening pulmonary status, but he has been well covered for possible infectious organisms on previous regimens and no improvement. Would D/C bactrim on very low presumption of PJP and stop azithromycin. Continue cefepime through today then stop and re-evaluate.   Ultimately he is likely going to require intubation for pulmonary support as he is ongoingly hypoxic on high doses of oxygen.   Of note has a thoracic aneurysm and would avoid quinolones if alternative treatment option available.    Plan: 1. Would stop Bactrim and azithromycin  2. Continue Cefepime today then stop 3. Stop Vancomycin (PCR negative) 4. Bronch will be helpful to evaluate further if can get consent vs emergency procedure     Principal Problem:   Acute respiratory failure with hypoxia ([place]) Active Problems:   Hyponatremia   Intractable hiccups   Diarrhea   Asthenia   Essential hypertension   Community acquired bacterial pneumonia   Acute interstitial pneumonia ([place])   . chlorhexidine  15 mL Mouth Rinse BID  . Chlorhexidine Gluconate Cloth  6 each Topical Daily  .  enoxaparin (LOVENOX) injection  40 mg Subcutaneous QHS  . feeding supplement (ENSURE ENLIVE)  237 mL Oral TID BM  . Ipratropium-Albuterol  1 puff Inhalation Q6H  . mouth rinse  15 mL Mouth Rinse BID  . mouth rinse  15 mL Mouth Rinse q12n4p  . metoprolol tartrate  25 mg Oral BID  . multivitamin with minerals  1 tablet Oral Daily  . nicotine  21 mg Transdermal Daily  . pantoprazole  40 mg Oral Daily  . sodium chloride flush  3 mL Intravenous Once  . thiamine  100 mg Oral Daily    HPI: Charles Walters is a 59 y.o. male who presented from home for evaluation of hiccups.   He describes differently events prior to admission with different interviewers. States that he has had a significant weight loss unintentionally over the last 6 months. Had shortness of breath that started seemingly suddenly the morning of admission to hospital. He does not recall any illnesses leading up to his current state and denies any rhinorrhea, sore throat, cough, diarrhea, vomiting or otherwise. He states he "feels pretty good now" at rest in the bed on high flow oxygen and respiratory rate 30s. He is a daily cigarette smoker. No other illicit drug use. He drinks a beer a few days a week. No travel - only goes from work at [employer] to home. Has a male partner, Willette Cluster for the past 3 years. Denies MSM or IVDU.  In ER he had a new oxygen requirement but otherwise hemodynamically stable. Chest CT done yesterday reveals extensive worsening crazy paving pattern throughout the peripheral lungs bilaterally but involving all lobes. New patchy regions of consolidative airspace disease in the lungs bilaterally most prominent in the lower lobes. Consideration for acute interstitial pneumonia, ARDS, atypical infection of pulmonary alveolar proteinosis. Dilated pulmonary artery suggesting PAH. Stable 4.4 cm ascending thoracic aneurysm.   Over the course of his hospitalization he has been treated with 13 days of antibiotics:  Vanc +  Cefepime (2/17 >> 2/19) Levaquin (2/20 >> 2/24) cefepime + Vancomycin (2/25 >> 3/01) Bactrim + Azithromycin (03/02 >>   PCCM had planned a bronch today but unable to consent him due to confusion. He states he has family (siblings) up in [place] but unclear on their contact information.    Review of Systems: Review of Systems  Constitutional: Positive for fever and weight loss. Negative for chills.  Eyes: Negative for blurred vision.  Respiratory: Negative for cough and shortness of breath.   Cardiovascular: Negative for chest pain.  Gastrointestinal: Negative for abdominal pain, diarrhea, nausea and vomiting.  Genitourinary: Negative for dysuria.  Musculoskeletal: Negative for back pain and myalgias.  Skin: Negative for itching and rash.  Neurological: Negative for dizziness and weakness.  Psychiatric/Behavioral: Negative for depression.     Past Medical History:  Diagnosis Date  . Hypertension     Social History   Tobacco Use  . Smoking status: Current Every Day Smoker  . Smokeless tobacco: Never Used  Substance Use Topics  . Alcohol use: Yes  . Drug use: No    History reviewed. No pertinent family history. No Known Allergies   OBJECTIVE: Blood pressure 127/81, pulse (!) 122, temperature 99.1 F (37.3 C), temperature source Axillary, resp. rate (!) 43, height 6\' 1"  (1.854 m), weight 59.8 kg, SpO2 92 %.   Physical Exam Vitals and nursing note reviewed.  Constitutional:      Appearance: He is well-developed.     Comments: Ill appearing, malnourished male  HENT:     Mouth/Throat:     Mouth: Mucous membranes are moist.     Pharynx: Oropharynx is clear.  Cardiovascular:     Rate and Rhythm: Regular rhythm. Tachycardia present.  Pulmonary:     Effort: Tachypnea present.     Breath sounds: Examination of the right-lower field reveals decreased breath sounds. Examination of the left-lower field reveals decreased breath sounds. Decreased breath sounds present. No  wheezing, rhonchi or rales.  Abdominal:     General: Bowel sounds are normal.     Palpations: Abdomen is soft.  Musculoskeletal:        General: Normal range of motion.  Skin:    General: Skin is warm and dry.     Capillary Refill: Capillary refill takes less than 2 seconds.  Neurological:     Mental Status: He is alert and oriented to person, place, and time.  Psychiatric:     Comments: Seems confused at times     Lab Results Lab Results  Component Value Date   WBC 23.7 (H) 26-Aug-2019   HGB 8.6 (L) 2019-08-26   HCT 24.6 (L) 08/26/19   MCV 90.4 Aug 26, 2019   PLT 282 08-26-2019    Lab Results  Component Value Date   CREATININE 0.67 August 26, 2019   BUN 11 08-26-2019   NA 135 08-26-19   K 4.2 08/26/2019   CL 102 08-26-19   CO2 22 August 26, 2019    Lab Results  Component Value Date   ALT 43 08/01/2019   AST 45 (H) 08/01/2019   ALKPHOS 56 08/01/2019   BILITOT 0.6 08/01/2019     Microbiology: Recent Results (from the past 240 hour(s))  Culture, blood (Routine X 2) w Reflex to ID Panel     Status: None   Collection Time: 07/30/19  6:00 PM   Specimen: BLOOD  Result Value Ref Range Status   Specimen Description BLOOD LEFT ANTECUBITAL  Final   Special Requests   Final    BOTTLES DRAWN AEROBIC AND ANAEROBIC Blood Culture adequate volume   Culture   Final    NO GROWTH 5 DAYS Performed at [facility], [address], [place], [place] [address]    Report Status 08/04/2019 FINAL  Final  Culture, blood (Routine X 2) w Reflex to ID Panel     Status: None   Collection Time: 07/30/19  6:07 PM   Specimen: BLOOD LEFT HAND  Result Value Ref Range Status   Specimen Description BLOOD LEFT HAND  Final   Special Requests   Final    BOTTLES DRAWN AEROBIC AND ANAEROBIC Blood Culture adequate volume   Culture   Final    NO GROWTH 5 DAYS Performed at [facility], [address], [place], [place] [address]    Report Status 08/04/2019 FINAL  Final  Culture, blood  (routine x 2)     Status: None   Collection Time: 08/02/19  5:50 PM   Specimen: BLOOD  Result Value Ref Range Status   Specimen Description BLOOD LEFT ANTECUBITAL  Final   Special Requests   Final    BOTTLES DRAWN AEROBIC AND ANAEROBIC Blood Culture adequate volume   Culture   Final    NO GROWTH 5 DAYS Performed at [facility], [address] [place], [place] [address]    Report Status 09/02/2019 FINAL  Final  Culture, blood (routine x 2)     Status: None   Collection Time: 08/02/19  6:00 PM   Specimen: BLOOD RIGHT HAND  Result Value Ref Range Status   Specimen Description BLOOD RIGHT HAND  Final   Special Requests   Final    BOTTLES DRAWN AEROBIC AND ANAEROBIC Blood Culture adequate volume   Culture   Final    NO GROWTH 5 DAYS Performed at [facility], [address] [place], [place] [address]    Report Status 08/11/2019 FINAL  Final  MRSA PCR Screening     Status: Abnormal   Collection Time: 08/03/19  6:30 PM   Specimen: Nasal Mucosa; Nasopharyngeal  Result Value Ref Range Status   MRSA by PCR (A) NEGATIVE Final    INVALID, UNABLE TO DETERMINE THE PRESENCE OF TARGET DUE TO SPECIMEN INTEGRITY. RECOLLECTION REQUESTED.    CommentTerressa Koyanagi RN 0102 08/03/19 A BROWNING Performed at [facility], [address] [place], [place] [address]   MRSA PCR Screening     Status: None   Collection Time: 08/09/2019  5:01 AM   Specimen: Nasal Mucosa; Nasopharyngeal  Result Value Ref Range Status   MRSA by PCR NEGATIVE NEGATIVE Final    Comment:        The GeneXpert MRSA Assay (FDA approved for NASAL specimens only), is one component of a comprehensive MRSA colonization surveillance program. It is not intended to diagnose MRSA infection nor to guide or monitor treatment for MRSA infections. Performed at [facility], [address] [place],  [place] [address]     Rexene Alberts, MSN, NP-C [facility]   [email or web address] Pager: [phone] Office: [phone] [facility] Main Line: [phone]   10-Aug-2019 1:09 PM

## 2019-08-08 ENCOUNTER — Inpatient Hospital Stay (HOSPITAL_COMMUNITY): Payer: Medicaid Other

## 2019-08-08 DIAGNOSIS — R0603 Acute respiratory distress: Secondary | ICD-10-CM

## 2019-08-08 DIAGNOSIS — Z720 Tobacco use: Secondary | ICD-10-CM | POA: Diagnosis present

## 2019-08-08 DIAGNOSIS — D72829 Elevated white blood cell count, unspecified: Secondary | ICD-10-CM

## 2019-08-08 DIAGNOSIS — R000 Tachycardia, unspecified: Secondary | ICD-10-CM | POA: Diagnosis present

## 2019-08-08 DIAGNOSIS — E43 Unspecified severe protein-calorie malnutrition: Secondary | ICD-10-CM | POA: Diagnosis present

## 2019-08-08 LAB — CBC WITH DIFFERENTIAL/PLATELET
Abs Immature Granulocytes: 0.28 10*3/uL — ABNORMAL HIGH (ref 0.00–0.07)
Basophils Absolute: 0 10*3/uL (ref 0.0–0.1)
Basophils Relative: 0 %
Eosinophils Absolute: 0 10*3/uL (ref 0.0–0.5)
Eosinophils Relative: 0 %
HCT: 22.5 % — ABNORMAL LOW (ref 39.0–52.0)
Hemoglobin: 7.8 g/dL — ABNORMAL LOW (ref 13.0–17.0)
Immature Granulocytes: 1 %
Lymphocytes Relative: 5 %
Lymphs Abs: 1.2 10*3/uL (ref 0.7–4.0)
MCH: 31.3 pg (ref 26.0–34.0)
MCHC: 34.7 g/dL (ref 30.0–36.0)
MCV: 90.4 fL (ref 80.0–100.0)
Monocytes Absolute: 2.2 10*3/uL — ABNORMAL HIGH (ref 0.1–1.0)
Monocytes Relative: 10 %
Neutro Abs: 18.4 10*3/uL — ABNORMAL HIGH (ref 1.7–7.7)
Neutrophils Relative %: 84 %
Platelets: 241 10*3/uL (ref 150–400)
RBC: 2.49 MIL/uL — ABNORMAL LOW (ref 4.22–5.81)
RDW: 12.7 % (ref 11.5–15.5)
WBC: 22.1 10*3/uL — ABNORMAL HIGH (ref 4.0–10.5)
nRBC: 0 % (ref 0.0–0.2)

## 2019-08-08 LAB — BASIC METABOLIC PANEL
Anion gap: 11 (ref 5–15)
BUN: 20 mg/dL (ref 6–20)
CO2: 23 mmol/L (ref 22–32)
Calcium: 8.3 mg/dL — ABNORMAL LOW (ref 8.9–10.3)
Chloride: 101 mmol/L (ref 98–111)
Creatinine, Ser: 0.88 mg/dL (ref 0.61–1.24)
GFR calc Af Amer: >60 mL/min (ref >60)
GFR calc non Af Amer: >60 mL/min (ref >60)
Glucose, Bld: 101 mg/dL — ABNORMAL HIGH (ref 70–99)
Potassium: 3.6 mmol/L (ref 3.5–5.1)
Sodium: 135 mmol/L (ref 135–145)

## 2019-08-08 LAB — PROCALCITONIN: Procalcitonin: 3.64 ng/mL

## 2019-08-08 MED ORDER — NICOTINE 7 MG/24HR TD PT24
7.0000 mg | MEDICATED_PATCH | Freq: Every day | TRANSDERMAL | Status: DC
  Administered 2019-08-09 – 2019-08-17 (×9): 7 mg via TRANSDERMAL
  Filled 2019-08-08 (×9): qty 1

## 2019-08-08 MED ORDER — ENSURE ENLIVE PO LIQD
237.0000 mL | Freq: Three times a day (TID) | ORAL | Status: DC
  Administered 2019-08-08 – 2019-08-10 (×4): 237 mL via ORAL

## 2019-08-08 NOTE — Evaluation (Signed)
Occupational Therapy Evaluation Patient Details Name: Charles Walters MRN: [id number] DOB: 08-21-1960 Today's Date: 08/08/2019    History of Present Illness This 59 y.o. male admitted with intractable hiccups, loss of appetite, watery diarrhea, and DOE.  Pt with respiratory failure  with infiltratres and hypoxemia resulting in hi flow O2 and tranfser to 2H. Dx: acute respiratory failure with hypoxia, multifocal PNA, hyponatremia.  PMH includes: HTN   Clinical Impression   Pt ha d a significant change in status. OT re-evaluation, POC updated and goals updated. Pt currently maxA +2 for bed mobility and maxA+2 for squat pivot transfer. Pt unable to stand fully upright and forward lean for transfer. Pt maxA overall for ADL due to decreased activity tolerance. Pt requires increased multimodal cues to follow commands, ptwith decreased strength and poor ability to care for self without fatigue.  Pt on 100% hi flow O2 on 40L at rest 96% O2; sitting EOB to 82% and quickly recovers above 90% with seated rest break.  Pt's HR increasing to 125 throughout session. BP 115/84 (93) 96% O2 after rest. Pt would greatly benefit from continued OT skilled services for ADL, mobility and safety in CIR setting. OT following acutely.     Follow Up Recommendations  CIR;Supervision/Assistance - 24 hour    Equipment Recommendations  Tub/shower seat;3 in 1 bedside commode    Recommendations for Other Services Rehab consult     Precautions / Restrictions Precautions Precautions: Fall;Other (comment) Precaution Comments: O2  Restrictions Weight Bearing Restrictions: No      Mobility Bed Mobility Overal bed mobility: Needs Assistance Bed Mobility: Supine to Sit     Supine to sit: Max assist;+2 for physical assistance;HOB elevated     General bed mobility comments: Assist for trunk elevation and BLE management.   Transfers Overall transfer level: Needs assistance Equipment used: 2 person hand held  assist Transfers: Sit to/from [employer] Sit to Stand: Max assist;+2 physical assistance   Squat pivot transfers: Max assist;+2 physical assistance     General transfer comment: needing increased assist for safety, poor line management and awareness of cardiopulm deficits    Balance Overall balance assessment: Needs assistance Sitting-balance support: Bilateral upper extremity supported;Feet supported Sitting balance-Leahy Scale: Poor Sitting balance - Comments: Pt wanting to lean back and return to supine   Standing balance support: No upper extremity supported;During functional activity Standing balance-Leahy Scale: Fair                             ADL either performed or assessed with clinical judgement   ADL Overall ADL's : Needs assistance/impaired Eating/Feeding: NPO   Grooming: Moderate assistance;Sitting   Upper Body Bathing: Moderate assistance;Sitting   Lower Body Bathing: Maximal assistance;+2 for physical assistance;Cueing for safety;Sitting/lateral leans;Sit to/from stand   Upper Body Dressing : Moderate assistance;Standing   Lower Body Dressing: Maximal assistance   Toilet Transfer: Maximal assistance;+2 for physical assistance;Squat-pivot;BSC Toilet Transfer Details (indicate cue type and reason): simulated to recliner Toileting- Clothing Manipulation and Hygiene: Total assistance;+2 for physical assistance;Cueing for safety;Sitting/lateral lean;Sit to/from stand       Functional mobility during ADLs: Maximal assistance;+2 for physical assistance;+2 for safety/equipment;Cueing for safety;Cueing for sequencing General ADL Comments: Pt requires increased multimodal cues to follow commands, ptwith decreased strength and poor ability to care for self without fatigue.     Vision Baseline Vision/History: No visual deficits Vision Assessment?: No apparent visual deficits     Perception  Praxis      Pertinent Vitals/Pain Pain  Assessment: No/denies pain     Hand Dominance Right   Extremity/Trunk Assessment Upper Extremity Assessment Upper Extremity Assessment: Generalized weakness   Lower Extremity Assessment Lower Extremity Assessment: Defer to PT evaluation   Cervical / Trunk Assessment Cervical / Trunk Assessment: Normal   Communication Communication Communication: No difficulties   Cognition Arousal/Alertness: Awake/alert Behavior During Therapy: WFL for tasks assessed/performed;Flat affect Overall Cognitive Status: Impaired/Different from baseline Area of Impairment: Problem solving;Following commands;Safety/judgement                         Safety/Judgement: Decreased awareness of safety;Decreased awareness of deficits     General Comments: Pt has decreased awareness of deficits and increased time for processing in order to follow commands.    General Comments  100% hi flow O2 on 40L at rest 96% O2; sitting EOB to 82% and quickly recovers above 90% with seated rest break. Pt's HR increasing to 125 throughout session. BP 115/84 (93) 96% O2 after rest.    Exercises General Exercises - Upper Extremity Elbow Flexion: AROM;Seated   Shoulder Instructions      Home Living Family/patient expects to be discharged to:: Private residence Living Arrangements: Alone Available Help at Discharge: Friend(s);Available PRN/intermittently Type of Home: House Home Access: Level entry     Home Layout: One level     Bathroom Shower/Tub: Tub/shower unit;Curtain   Biochemist, clinical: Standard Bathroom Accessibility: No   Home Equipment: None   Additional Comments: Pt resides in a boarding house - private room with shared bath and shared kitchen       Prior Functioning/Environment Level of Independence: Independent        Comments: relies on public transportation, but can borrow a friend's truck. working as Clinical research associate for [employer] Problem List: Decreased strength;Decreased  activity tolerance;Impaired balance (sitting and/or standing);Cardiopulmonary status limiting activity;Decreased knowledge of use of DME or AE      OT Treatment/Interventions: Self-care/ADL training;Therapeutic exercise;Energy conservation;DME and/or AE instruction;Therapeutic activities;Cognitive remediation/compensation;Patient/family education;Balance training    OT Goals(Current goals can be found in the care plan section) Acute Rehab OT Goals Patient Stated Goal: get my strength back  OT Goal Formulation: With patient Time For Goal Achievement: 08/22/19 Potential to Achieve Goals: Good ADL Goals Pt Will Perform Grooming: with set-up;sitting Pt Will Perform Upper Body Bathing: with min assist;sitting Pt Will Perform Lower Body Bathing: with min assist;sit to/from stand Pt Will Perform Upper Body Dressing: with supervision;sitting Pt Will Perform Lower Body Dressing: with min assist;sitting/lateral leans;sit to/from stand Pt Will Transfer to Toilet: stand pivot transfer;bedside commode Pt Will Perform Toileting - Clothing Manipulation and hygiene: with min assist;sitting/lateral leans Pt Will Perform Tub/Shower Transfer: Tub transfer;with min assist;ambulating Additional ADL Goal #1: Pt will increase to x10 mins of sustained ADL and functional mobility tasks with minA overall to increase activity tolerance for ADL.  OT Frequency: Min 2X/week   Barriers to D/C:            Co-evaluation PT/OT/SLP Co-Evaluation/Treatment: Yes Reason for Co-Treatment: Complexity of the patient's impairments (multi-system involvement);To address functional/ADL transfers   OT goals addressed during session: ADL's and self-care      AM-PAC OT "6 Clicks" Daily Activity     Outcome Measure Help from another person eating meals?: Total Help from another person taking care of personal grooming?: A Lot Help from another person toileting, which includes using toliet,  bedpan, or urinal?: A Lot Help from  another person bathing (including washing, rinsing, drying)?: Total Help from another person to put on and taking off regular upper body clothing?: A Lot Help from another person to put on and taking off regular lower body clothing?: Total 6 Click Score: 9   End of Session Equipment Utilized During Treatment: Oxygen;Gait belt Nurse Communication: Mobility status  Activity Tolerance: Patient limited by fatigue;Treatment limited secondary to medical complications (Comment) Patient left: in chair;with call bell/phone within reach;with chair alarm set;with nursing/sitter in room  OT Visit Diagnosis: Unsteadiness on feet (R26.81);Other abnormalities of gait and mobility (R26.89)                Time: 5361-4431 OT Time Calculation (min): 24 min Charges:  OT General Charges $OT Visit: 1 Visit OT Evaluation $OT Re-eval: 1 Re-eval  Flora Lipps, OTR/L Acute Rehabilitation Services Pager: [phone] Office: [phone]   Lonzo Cloud 08/08/2019, 1:07 PM

## 2019-08-08 NOTE — Progress Notes (Signed)
Next of kin Cougar Imel indicated that patient's sister Nicole Kindred and daughter Milas Kocher may receive phone updates.

## 2019-08-08 NOTE — Plan of Care (Signed)
  Problem: Clinical Measurements: Goal: Respiratory complications will improve Outcome: Progressing   Problem: Clinical Measurements: Goal: Cardiovascular complication will be avoided Outcome: Progressing   Problem: Nutrition: Goal: Adequate nutrition will be maintained Outcome: Progressing   Problem: Elimination: Goal: Will not experience complications related to bowel motility Outcome: Progressing   Problem: Clinical Measurements: Goal: Will remain free from infection Outcome: Progressing

## 2019-08-08 NOTE — Progress Notes (Addendum)
PROGRESS NOTE    Charles Walters  ZOX:[id number] DOB: 10-11-1960 DOA: 07/11/2019 PCP: Marliss Coots, NP    Brief Narrative:  Patient was admitted to the hospital with a working diagnosis of community-acquired pneumonia complicated by acute hypoxic respiratory failure and severe hyponatremia.Patient with initially worsening pneumonia suspected multidrug resistant bacteria/ atypical pneumoniaor acute interstitial pneumonia  59 year old male who presented with hiccups. He does have significant past medical history for hypertension. He reported about 1 week of intractable hiccups associated with loss of appetite, and watery diarrhea. Positive dyspnea on exertion. On his initial physical examination blood pressure 156/119, heart rate 108, respiratory rate 27, oxygen saturation 98% on supplemental oxygen, his lungs had coarse breath sounds bilaterally, no wheezing, heart S1-S2 present and rhythmic, abdomen was soft and nontender, no lower extremity edema. Sodium 114, potassium 3.0, chloride 80, bicarb 17, glucose 126, BUN 7, creatinine 0.77, white count 10.9, hemoglobin 12.3, hematocrit 33.1, platelets 235. SARS COVID-19 negative. Urinary osmolarity 507, urine sodium <10, alcohol less than 10.His chest radiograph had bilateral bibasilar reticulonodular infiltrates, predominantly right base. EKG 128 bpm, normal axis, normal intervals, right atrial enlargement, sinus rhythm, poor R wave progression, LVH, no ST segment T wave changes.  Patient received hypertonic saline and intravenous antibiotics, further work-up with CT chest negative for pulmonary embolism, echocardiography with preserved LV and RV systolic function.  His discharge was cancelled due to worsening hypoxemic respiratory failure, along with worsening pulmonary radiographic changes,bilateral infiltrates at bases, more dense on the leftbase, . Resumed antibiotic therapy with vancomycin and cefepime, suspected multidrug resistant  bacterial pneumonia/ atypical pneumonia or acute interstitial process.  Patient with rapid worsening respiratory failure despite broad spectrum antibiotic therapy, follow CT chest with worsening infiltrates, suspected acute interstitial pneumonia/ pneumocystis.   03/02 Transferred to ICU for heated high flow nasal cannula therapy.      Assessment & Plan:   Principal Problem:   Acute respiratory failure with hypoxia (HCC) Active Problems:   Hyponatremia   Intractable hiccups   Diarrhea   Asthenia   Essential hypertension   Community acquired bacterial pneumonia   Acute interstitial pneumonia ([place])  1 acute hypoxemic respiratory failure Initially felt to be secondary to community-acquired pneumonia (atypical/viral) bilaterally. Patient with worsening hypoxic respiratory status/failure currently on high flow nasal cannula at 35 L/min with 100% FiO2 with sats of 93% and tachypneic with a respiratory rate of 34.  Patient with use of accessory muscles of respiration.  ABG done on 08/23/2019 with a pH of 7.43/PCO2 of 32/PO2 of 69/bicarb of 24.  Repeat CT chest done on 08/06/2019 with extensive worsening crazy paving pattern (groundglass opacity plus interlobular septal thickening) throughout the peripheral lungs bilaterally involving all lung lobes.  New patchy regions of consolidative airspace disease in the lungs bilaterally most prominent in the lower lobes.  Given interval worsening considerations include acute interstitial pneumonia/ARDS, atypical infection of pulmonary alveolar proteinosis.  Small dependent bilateral pleural effusions.  Dilated pulmonary arteries suggesting pulmonary arterial hypertension.  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  Status post 2-week course of empiric IV vancomycin, Levaquin, cefepime.  Patient received a dose of IV azithromycin and a dose of IV Bactrim.  Still with a leukocytosis of 22.1.  Repeat chest x-ray pending for this morning.  Respiratory  viral panel negative.  MRSA PCR negative.  SARS coronavirus 2 PCR negative.  Patient seen in consultation by ID and who had recommended finishing 14-day course of antibiotics which was completed 3-20 21.  Urine Legionella antigen and  strep pneumococcus antigen pending.  ID recommending bronchoscopy if patient was to get intubated due to worsening respiratory failure.  Pulmonary consulted as well and are following and were in agreement with discontinuation of antibiotics on 08/13/2019.  Patient started on IV steroids yesterday.  Continue flutter valve.  Incentive spirometry.  Patient with worsening respiratory status may need intubation however will defer to PCCM.  Pulmonary and ID following and appreciate input and recommendations.  2.  Severe protein calorie malnutrition When tolerating oral intake will need nutritional supplementation.  3.  Leukocytosis with bandemia Patient noted to have a bandemia.  Patient was pancultured with negative blood cultures from 08/02/2019.  MRSA PCR negative.  Respiratory viral panel negative.  SARS coronavirus 2 PCR negative.  Status post 14 days of broad-spectrum antibiotics which included vancomycin, cefepime, Bactrim, azithromycin.  Leukocytosis currently at 22.  ID following.  Follow.  4.  Severe hypovolemic hypoosmolar hyponatremia Resolved.  Felt secondary to hypovolemia in the setting of ARB and HCTZ.  Currently resolved.  Sodium at 135.  Follow renal function, electrolytes.  Avoid hypotension.  Follow.  Nephrology was following and have signed off.  5.  Hypertension Stable.  Blood pressure borderline.  Continue to hold antihypertensive agents.  6.  Sinus tachycardia Likely secondary to problem #1.  Lopressor 5 mg IV as needed heart rate greater than 130.  Follow.  7.  Tobacco abuse/alcohol use Per Dr. Cathlean Sauer patient drinks alcohol at home daily 1 beer daily and smokes 3 to 4 cigarettes daily.  Continue nicotine patch.  Continue multivitamin thiamine.  No signs  of withdrawal.  Follow.  8.  Acute diarrhea Resolved.   DVT prophylaxis: Lovenox Code Status: Full Family Communication: Updated patient.  No family at bedside. Disposition Plan:  . Patient came from: Home            . Anticipated d/c place: To be determined.  Home versus SNF . Barriers to d/c OR conditions which need to be met to effect a safe d/c: To be determined.  Home versus SNF when clinically improved with no further O2 requirements and cleared by pulmonary and ID.   Consultants:   Nephrology: Dr. Marval Regal 07/17/2019  Pulmonary and critical care medicine: Dr. Loanne Drilling 07/30/2019  Pulmonary critical care medicine: Dr. Shearon Stalls 08/12/2019  Infectious disease: Dr. Baxter Flattery 08/12/2019  Procedures:   CT chest 07/19/2019, 08/06/2019  CT angiogram chest 07/26/2019  2D echo 07/26/2019    Antimicrobials:   IV Levaquin 07/28/2019>>>>> 08/02/2019  IV azithromycin 03/27/2021x1 dose  IV cefepime 08/02/2019>>>> 08/16/2019  IV Bactrim 08/07/2018 21x1 dose  IV vancomycin 07/30/2019>>>>> 08/26/2019   Subjective: Patient laying in bed on 35 L high flow nasal cannula O2.  Some use of accessory muscles of respiration.  Dry cough.  Patient denies any further diarrhea.  Objective: Vitals:   08/08/19 0759 08/08/19 0800 08/08/19 0818 08/08/19 0900  BP:  116/86  108/76  Pulse:  (!) 128  (!) 109  Resp:  (!) 34  (!) 32  Temp:   97.7 F (36.5 C)   TempSrc:   Oral   SpO2: 98% 100%  100%  Weight:      Height:        Intake/Output Summary (Last 24 hours) at 08/08/2019 1002 Last data filed at 08/08/2019 0900 Gross per 24 hour  Intake 187 ml  Output 1500 ml  Net -1313 ml   Filed Weights   07/15/2019 1319 07/26/19 0000 08/08/2019 0459  Weight: 79.4 kg 64.4 kg 59.8 kg  Examination:  General exam: On 35 L of high flow nasal cannula O2.  Use of accessory muscles of respiration.  Frail.  Cachectic.  Temporal wasting Respiratory system: Coarse breath sounds in the bases and greater left than right  midlung.  No wheezing noted.  Poor air movement.  Use of accessory muscles of respiration.   Cardiovascular system: Tachycardia. No JVD, murmurs, rubs, gallops or clicks. No pedal edema. Gastrointestinal system: Abdomen is nondistended, soft and nontender. No organomegaly or masses felt. Normal bowel sounds heard. Central nervous system: Alert and oriented. No focal neurological deficits. Extremities: Symmetric 5 x 5 power. Skin: No rashes, lesions or ulcers Psychiatry: Judgement and insight appear fair. Mood & affect appropriate.     Data Reviewed: I have personally reviewed following labs and imaging studies  CBC: Recent Labs  Lab 08/04/19 0228 08/05/19 0212 08/06/19 0123 08/25/2019 0131 08/08/19 0248  WBC 13.4* 16.2* 16.5* 23.7* 22.1*  NEUTROABS 10.8* 12.6* 13.0* 19.9* 18.4*  HGB 7.7* 7.9* 7.5* 8.6* 7.8*  HCT 21.4* 22.6* 21.6* 24.6* 22.5*  MCV 87.7 90.4 89.3 90.4 90.4  PLT 250 278 272 282 606   Basic Metabolic Panel: Recent Labs  Lab 08/04/19 0228 08/05/19 0212 08/06/19 0123 08/22/2019 0131 08/08/19 0248  NA 132* 132* 135 135 135  K 3.8 3.6 4.3 4.2 3.6  CL 99 98 101 102 101  CO2 '23 24 25 22 23  '$ GLUCOSE 95 125* 100* 98 101*  BUN '9 12 10 11 20  '$ CREATININE 0.67 0.62 0.66 0.67 0.88  CALCIUM 8.2* 8.1* 8.2* 8.3* 8.3*   GFR: Estimated Creatinine Clearance: 78.3 mL/min (by C-G formula based on SCr of 0.88 mg/dL). Liver Function Tests: No results for input(s): AST, ALT, ALKPHOS, BILITOT, PROT, ALBUMIN in the last 168 hours. No results for input(s): LIPASE, AMYLASE in the last 168 hours. No results for input(s): AMMONIA in the last 168 hours. Coagulation Profile: No results for input(s): INR, PROTIME in the last 168 hours. Cardiac Enzymes: No results for input(s): CKTOTAL, CKMB, CKMBINDEX, TROPONINI in the last 168 hours. BNP (last 3 results) No results for input(s): PROBNP in the last 8760 hours. HbA1C: No results for input(s): HGBA1C in the last 72 hours. CBG: No  results for input(s): GLUCAP in the last 168 hours. Lipid Profile: No results for input(s): CHOL, HDL, LDLCALC, TRIG, CHOLHDL, LDLDIRECT in the last 72 hours. Thyroid Function Tests: No results for input(s): TSH, T4TOTAL, FREET4, T3FREE, THYROIDAB in the last 72 hours. Anemia Panel: No results for input(s): VITAMINB12, FOLATE, FERRITIN, TIBC, IRON, RETICCTPCT in the last 72 hours. Sepsis Labs: No results for input(s): PROCALCITON, LATICACIDVEN in the last 168 hours.  Recent Results (from the past 240 hour(s))  Culture, blood (Routine X 2) w Reflex to ID Panel     Status: None   Collection Time: 07/30/19  6:00 PM   Specimen: BLOOD  Result Value Ref Range Status   Specimen Description BLOOD LEFT ANTECUBITAL  Final   Special Requests   Final    BOTTLES DRAWN AEROBIC AND ANAEROBIC Blood Culture adequate volume   Culture   Final    NO GROWTH 5 DAYS Performed at [facility], [address], [place], [place] [address]    Report Status 08/04/2019 FINAL  Final  Culture, blood (Routine X 2) w Reflex to ID Panel     Status: None   Collection Time: 07/30/19  6:07 PM   Specimen: BLOOD LEFT HAND  Result Value Ref Range Status   Specimen  Description BLOOD LEFT HAND  Final   Special Requests   Final    BOTTLES DRAWN AEROBIC AND ANAEROBIC Blood Culture adequate volume   Culture   Final    NO GROWTH 5 DAYS Performed at [facility], [address], [place], [place] [address]    Report Status 08/04/2019 FINAL  Final  Culture, blood (routine x 2)     Status: None   Collection Time: 08/02/19  5:50 PM   Specimen: BLOOD  Result Value Ref Range Status   Specimen Description BLOOD LEFT ANTECUBITAL  Final   Special Requests   Final    BOTTLES DRAWN AEROBIC AND ANAEROBIC Blood Culture adequate volume   Culture   Final    NO GROWTH 5 DAYS Performed at [facility], [place] [address], [place], [place] [address]    Report Status 08/11/2019 FINAL  Final  Culture, blood (routine x 2)      Status: None   Collection Time: 08/02/19  6:00 PM   Specimen: BLOOD RIGHT HAND  Result Value Ref Range Status   Specimen Description BLOOD RIGHT HAND  Final   Special Requests   Final    BOTTLES DRAWN AEROBIC AND ANAEROBIC Blood Culture adequate volume   Culture   Final    NO GROWTH 5 DAYS Performed at [facility], [place] [address], [place], [place] [address]    Report Status 08/27/2019 FINAL  Final  MRSA PCR Screening     Status: Abnormal   Collection Time: 08/03/19  6:30 PM   Specimen: Nasal Mucosa; Nasopharyngeal  Result Value Ref Range Status   MRSA by PCR (A) NEGATIVE Final    INVALID, UNABLE TO DETERMINE THE PRESENCE OF TARGET DUE TO SPECIMEN INTEGRITY. RECOLLECTION REQUESTED.    CommentConcha Pyo RN 1027 08/03/19 A BROWNING Performed at [facility], [place] [address], [place], [place] [address]   MRSA PCR Screening     Status: None   Collection Time: 08/19/2019  5:01 AM   Specimen: Nasal Mucosa; Nasopharyngeal  Result Value Ref Range Status   MRSA by PCR NEGATIVE NEGATIVE Final    Comment:        The GeneXpert MRSA Assay (FDA approved for NASAL specimens only), is one component of a comprehensive MRSA colonization surveillance program. It is not intended to diagnose MRSA infection nor to guide or monitor treatment for MRSA infections. Performed at [facility], [place] [address] [place], [place] [address]          Radiology Studies: DG CHEST PORT 1 VIEW  Result Date: 08/08/2019 CLINICAL DATA:  Dyspnea, hypoxia EXAM: PORTABLE CHEST 1 VIEW COMPARISON:  Chest radiograph from one day prior. FINDINGS: Stable cardiomediastinal silhouette with normal heart size. No pneumothorax. No pleural effusion. Extensive patchy hazy opacity throughout both lungs, most prominent in right mid lung, not substantially changed. IMPRESSION: Stable chest radiograph with extensive patchy hazy opacity throughout both lungs, most prominent in the right mid lung. Electronically  Signed   By: Ilona Sorrel M.D.   On: 08/08/2019 09:55   DG CHEST PORT 1 VIEW  Result Date: 08/10/2019 CLINICAL DATA:  Hypoxia, history of pneumonia EXAM: PORTABLE CHEST 1 VIEW COMPARISON:  None. FINDINGS: The heart size and mediastinal contours are unchanged with mild cardiomegaly. There is no significant interval change in the bilateral patchy and reticulonodular airspace opacities. No pleural effusion. No acute osseous abnormality. IMPRESSION: Stable examination. Multifocal patchy airspace opacities throughout both lungs. Electronically Signed  By: Prudencio Pair M.D.   On: 09/02/2019 06:51        Scheduled Meds: . chlorhexidine  15 mL Mouth Rinse BID  . Chlorhexidine Gluconate Cloth  6 each Topical Daily  . enoxaparin (LOVENOX) injection  40 mg Subcutaneous QHS  . Ipratropium-Albuterol  1 puff Inhalation Q6H  . mouth rinse  15 mL Mouth Rinse BID  . mouth rinse  15 mL Mouth Rinse q12n4p  . methylPREDNISolone (SOLU-MEDROL) injection  60 mg Intravenous Q24H  . multivitamin with minerals  1 tablet Oral Daily  . nicotine  21 mg Transdermal Daily  . pantoprazole (PROTONIX) IV  40 mg Intravenous Q24H  . sodium chloride flush  3 mL Intravenous Once  . thiamine injection  100 mg Intravenous Daily   Continuous Infusions:   LOS: 14 days    Time spent: 45 minutes    Irine Seal, MD [facility]   To contact the attending provider between 7A-7P or the covering provider during after hours 7P-7A, please log into the web site [email or web address] and access using universal [place] password for that web site. If you do not have the password, please call the hospital operator.  08/08/2019, 10:02 AM

## 2019-08-08 NOTE — Progress Notes (Signed)
[facility] for Infectious Disease    Date of Admission:  07/29/2019              ID: Charles Walters is a 59 y.o. male with interstitial pneumonia Principal Problem:   Acute respiratory failure with hypoxia (HCC) Active Problems:   Hyponatremia   Intractable hiccups   Diarrhea   Asthenia   Essential hypertension   Community acquired bacterial pneumonia   Acute interstitial pneumonia (HCC)   Leukocytosis   Sinus tachycardia   Tobacco abuse   Severe protein-calorie malnutrition (HCC)    Subjective: Appears to have significant shortness of breath after getting transferred to chair and some personal hygiene activities. He slept poorly due to shortness of breath, on 15L of [place] oxygen.  MAR does not appear that he has received steroids yet  Medications:  . chlorhexidine  15 mL Mouth Rinse BID  . Chlorhexidine Gluconate Cloth  6 each Topical Daily  . enoxaparin (LOVENOX) injection  40 mg Subcutaneous QHS  . feeding supplement (ENSURE ENLIVE)  237 mL Oral TID BM  . Ipratropium-Albuterol  1 puff Inhalation Q6H  . mouth rinse  15 mL Mouth Rinse BID  . mouth rinse  15 mL Mouth Rinse q12n4p  . methylPREDNISolone (SOLU-MEDROL) injection  60 mg Intravenous Q24H  . multivitamin with minerals  1 tablet Oral Daily  . [START ON 08/09/2019] nicotine  7 mg Transdermal Daily  . pantoprazole (PROTONIX) IV  40 mg Intravenous Q24H  . sodium chloride flush  3 mL Intravenous Once  . thiamine injection  100 mg Intravenous Daily    Objective: Vital signs in last 24 hours: Temp:  [97.5 F (36.4 C)-99.3 F (37.4 C)] 99 F (37.2 C) (03/03 1138) Pulse Rate:  [109-139] 127 (03/03 1300) Resp:  [24-46] 43 (03/03 1300) BP: (94-132)/(59-95) 118/82 (03/03 1300) SpO2:  [85 %-100 %] 89 % (03/03 1300) FiO2 (%):  [100 %] 100 % (03/03 0800) Physical Exam  Constitutional: He is oriented to person, place, and time sitting up in chair. He appears well-developed and well-nourished. No distress.    HENT: bitemporal wasting, purse lips  Mouth/Throat: Oropharynx is clear and moist. No oropharyngeal exudate.  Cardiovascular: tachy, nl s1,s2 Exam reveals no gallop and no friction rub.  No murmur heard.  Pulmonary/Chest: tachy. No respiratory distress. He has no wheezes. With acc. Muscle use Abdominal: Soft. Bowel sounds are normal. He exhibits no distension. There is no tenderness.  Lymphadenopathy:  He has no cervical adenopathy.  Neurological: He is alert and oriented to person, place, and time.  Skin: Skin is warm and dry. No rash noted. No erythema.  Psychiatric: He has a normal mood and affect. His behavior is normal.     Lab Results Recent Labs    09/01/2019 0131 08/08/19 0248  WBC 23.7* 22.1*  HGB 8.6* 7.8*  HCT 24.6* 22.5*  NA 135 135  K 4.2 3.6  CL 102 101  CO2 22 23  BUN 11 20  CREATININE 0.67 0.88   Microbiology: reviewed Studies/Results: DG CHEST PORT 1 VIEW  Result Date: 08/08/2019 CLINICAL DATA:  Dyspnea, hypoxia EXAM: PORTABLE CHEST 1 VIEW COMPARISON:  Chest radiograph from one day prior. FINDINGS: Stable cardiomediastinal silhouette with normal heart size. No pneumothorax. No pleural effusion. Extensive patchy hazy opacity throughout both lungs, most prominent in right mid lung, not substantially changed. IMPRESSION: Stable chest radiograph with extensive patchy hazy opacity throughout both lungs, most prominent in the right mid lung. Electronically Signed  By: Ilona Sorrel M.D.   On: 08/08/2019 09:55   DG CHEST PORT 1 VIEW  Result Date: 08/16/2019 CLINICAL DATA:  Hypoxia, history of pneumonia EXAM: PORTABLE CHEST 1 VIEW COMPARISON:  None. FINDINGS: The heart size and mediastinal contours are unchanged with mild cardiomegaly. There is no significant interval change in the bilateral patchy and reticulonodular airspace opacities. No pleural effusion. No acute osseous abnormality. IMPRESSION: Stable examination. Multifocal patchy airspace opacities throughout both  lungs. Electronically Signed   By: Prudencio Pair M.D.   On: 08/13/2019 06:51     Assessment/Plan: Interstitial/atypical pneumonia = unclear etiology. Still would like to get BAL to get specimen for culture, to consider other atypical infections such as nocardia. Will schedule him to have his steroid dose this am since he is some respiratory distress likely to minimal activity in moving from bed to chair  Respiratory distress = concern that he will eventually be fatigued, and require intubation  Tachycardia = likely related to primary issue.  [facility] Cell: [phone] Pager: [phone]  08/08/2019, 1:17 PM

## 2019-08-08 NOTE — Progress Notes (Signed)
Called patient's brother and next of kin, Harris Penton, regarding who can call and receive updates about the patient. RN was told not to speak to anyone but him and we are not to give phone updates to anyone else.

## 2019-08-08 NOTE — Progress Notes (Signed)
Inpatient Rehabilitation Admissions Coordinator  I will continue to follow pt's progress at a distance to assist with planing dispo when appropriate.  Ottie Glazier, RN, MSN Rehab Admissions Coordinator [phone] 08/08/2019 5:22 PM

## 2019-08-08 NOTE — Progress Notes (Signed)
Physical Therapy Re-evaluation Patient Details Name: Charles Walters MRN: [id number] DOB: 1960-11-28 Today's Date: 08/08/2019    History of Present Illness This 59 y.o. male admitted with intractable hiccups, loss of appetite, watery diarrhea, and DOE.  Pt with respiratory failure  with infiltratres and hypoxemia resulting in hi flow O2 and tranfser to [place]. Dx: acute respiratory failure with hypoxia, multifocal PNA, hyponatremia.  PMH includes: HTN. Pt with worsening pulmonary status, requiring 100% FiO2 high flow oxygen at 40L and transfer to ICU.    PT Comments    PT performs re-evaluation due to change in medical status with patient being transferred to critical care unit with increased oxygen needs. Pt with significant decrease in activity tolerance and increased physical assistance requirements during this session. Pt is at a high falls risk due to LE weakness and reduced safety awareness. Pt with desat during transfer, despite 40L 100% FiO2 supplemental oxygen. Pt will benefit from continued acute PT POC to improve activity tolerance and reduce falls risk.   Follow Up Recommendations  CIR(activity tolerance will need to improve)     Equipment Recommendations  (defer to post-acute)    Recommendations for Other Services       Precautions / Restrictions Precautions Precautions: Fall;Other (comment) Precaution Comments: O2  Restrictions Weight Bearing Restrictions: No    Mobility  Bed Mobility Overal bed mobility: Needs Assistance Bed Mobility: Supine to Sit     Supine to sit: Mod assist     General bed mobility comments: Assist for trunk elevation and BLE management.   Transfers Overall transfer level: Needs assistance Equipment used: 2 person hand held assist Transfers: Set designer Transfers;Sit to/from Stand Sit to Stand: Max assist;+2 physical assistance   Squat pivot transfers: Max assist;+2 physical assistance     General transfer comment: needing increased assist  for safety, poor line management and awareness of cardiopulm deficits  Ambulation/Gait                 Stairs             Wheelchair Mobility    Modified Rankin (Stroke Patients Only)       Balance Overall balance assessment: Needs assistance Sitting-balance support: Bilateral upper extremity supported;Feet supported Sitting balance-Leahy Scale: Poor Sitting balance - Comments: minA at edge of bed   Standing balance support: Bilateral upper extremity supported Standing balance-Leahy Scale: Zero Standing balance comment: maxA x2                            Cognition Arousal/Alertness: Awake/alert Behavior During Therapy: WFL for tasks assessed/performed;Flat affect Overall Cognitive Status: Impaired/Different from baseline Area of Impairment: Memory;Following commands;Safety/judgement;Problem solving                     Memory: Decreased recall of precautions;Decreased short-term memory Following Commands: Follows one step commands with increased time Safety/Judgement: Decreased awareness of safety;Decreased awareness of deficits   Problem Solving: Slow processing General Comments: Pt has decreased awareness of deficits and increased time for processing in order to follow commands.       Exercises General Exercises - Upper Extremity Elbow Flexion: AROM;Seated General Exercises - Lower Extremity Ankle Circles/Pumps: AROM;Both;10 reps Quad Sets: AROM;Both;5 reps Gluteal Sets: AROM;Both;5 reps Hip ABduction/ADduction: AAROM;Both;5 reps    General Comments General comments (skin integrity, edema, etc.): 100% FiO2 40L high flow, desats to 84% briefly when mobilizing to chair but recovers quickly      Pertinent  Vitals/Pain Pain Assessment: No/denies pain    Home Living Family/patient expects to be discharged to:: Private residence Living Arrangements: Alone Available Help at Discharge: Friend(s);Available PRN/intermittently Type of  Home: House Home Access: Level entry   Home Layout: One level Home Equipment: None Additional Comments: Pt resides in a boarding house - private room with shared bath and shared kitchen     Prior Function Level of Independence: Independent      Comments: relies on public transportation, but can borrow a friend's truck. working as Nature conservation officer for [employer]   PT Goals (current goals can now be found in the care plan section) Acute Rehab PT Goals Patient Stated Goal: get my strength back  PT Goal Formulation: With patient Time For Goal Achievement: 08/22/19 Potential to Achieve Goals: Fair Progress towards PT goals: Not progressing toward goals - comment(limited by oxygen needs and pulmonary status)    Frequency    Min 3X/week      PT Plan Current plan remains appropriate    Co-evaluation PT/OT/SLP Co-Evaluation/Treatment: Yes Reason for Co-Treatment: Complexity of the patient's impairments (multi-system involvement);Necessary to address cognition/behavior during functional activity;For patient/therapist safety PT goals addressed during session: Mobility/safety with mobility;Balance;Strengthening/ROM OT goals addressed during session: ADL's and self-care      AM-PAC PT "6 Clicks" Mobility   Outcome Measure  Help needed turning from your back to your side while in a flat bed without using bedrails?: A Lot Help needed moving from lying on your back to sitting on the side of a flat bed without using bedrails?: A Lot Help needed moving to and from a bed to a chair (including a wheelchair)?: Total Help needed standing up from a chair using your arms (e.g., wheelchair or bedside chair)?: Total Help needed to walk in hospital room?: Total Help needed climbing 3-5 steps with a railing? : Total 6 Click Score: 8    End of Session Equipment Utilized During Treatment: Oxygen;Gait belt Activity Tolerance: Patient limited by fatigue Patient left: in chair;with call bell/phone within  reach;with chair alarm set;with nursing/sitter in room Nurse Communication: Mobility status PT Visit Diagnosis: Difficulty in walking, not elsewhere classified (R26.2);Other (comment)     Time: 6314-9702 PT Time Calculation (min) (ACUTE ONLY): 23 min  Charges:  $Therapeutic Exercise: 8-22 mins                     Arlyss Gandy, PT, DPT Acute Rehabilitation Pager: [phone]    Arlyss Gandy 08/08/2019, 1:17 PM

## 2019-08-08 NOTE — Progress Notes (Addendum)
NAME:  Charles Walters, MRN:  [id number], DOB:  [birth year], LOS: 14 ADMISSION DATE:  07/16/2019, CONSULTATION DATE:  07/30/2019 REFERRING MD:  Rodolph Bong, MD, CHIEF COMPLAINT:  Hypoxemic respiratory failure  Brief History    The patient is a 59 year old gentleman who presented 07/22/2019 with hiccups, loss of appetite, hyponatremia, watery diarrhea.  He was also short of breath on exertion.  He had a grossly abnormal chest x-ray and was found to be incidentally hypoxemic.  He was seen initially by pulmonary critical care team and treated empirically for community-acquired pneumonia.  However over the last week he has continued to decline, had worsening hypoxemia, and had a repeat CT chest which shows persistent bilateral lower lobe predominant groundglass opacities with interlobular septal thickening.  Is being called back to evaluate these opacities in the setting of hypoxemic respiratory failure.  2/22 pulmonary consulted - recommended CAP coverage, outpatient follow up.  3/1 pulmonary reconsulted for persistent infiltrates and hypoxemia. Planned for bronch 3/2 morning - transferred to ICU, bronch cancelled for worsening O2 requirements.  08/08/2019 requiring 100% high flow oxygen Consults:  PCCM ID Procedures:    Significant Diagnostic Tests:    Micro Data:  Cultures no growth to date Respiratory virus panel negative MRSA PCR negative Covid negative 08/08/2019 blood cultures x2>> 08/08/2019 urine Legionella>> Antimicrobials:  Completed  Interim history/subjective:  Currently on 100% oxygen, appears weak Objective   Blood pressure 132/80, pulse (!) 116, temperature 97.7 F (36.5 C), temperature source Oral, resp. rate (!) 34, height 6\' 1"  (1.854 m), weight 59.8 kg, SpO2 93 %.    FiO2 (%):  [100 %] 100 %   Intake/Output Summary (Last 24 hours) at 08/08/2019 1105 Last data filed at 08/08/2019 0900 Gross per 24 hour  Intake 187 ml  Output 1300 ml  Net -1113 ml   Filed Weights   07/31/2019 1319 07/26/19 0000 27-Aug-2019 0459  Weight: 79.4 kg 64.4 kg 59.8 kg    Examination: General: Cachectic male appears weak HEENT: Poor dentition, no JVD is appreciated Neuro: Follows commands but is weak CV: Heart sounds are distant PULM: Decreased air movement throughout Currently on high flow oxygen 100% with saturations of 90  Chest x-ray 08/08/2019 no significant change  GI: soft, bsx4 active  GU: Amber urine Extremities: warm/dry, 1+ edema  Skin: no rashes or lesions    Assessment & Plan:  Charles Walters is a 59 y.o. man who presented initially on 07/10/2019 with weakness and diarrhea and hyponatremia now found to have progressive hypoxemic respiratory failure   Acute hypoxemic respiratory failure likely secondary to: Abnormal CT Chest - concerning for acute interstitial pneumonia  Required transfer to the intensive care on 2019/08/27 Currently on 100% oxygen high flow Desaturates to 70s when off of oxygen for less than a minute. No way to do a bronchoscopy at this time unless he gets intubated. Continue to monitor in intensive care unit.  Acute metabolic encephalopathy - likely secondary to delirium and respiratory failure  management as above  Severe protein calorie malnutrition Body mass index is 17.39 kg/m.  Leukocytosis with bandemia Has been having fevers since 2/22 although these are more low grade now.  Check procalcitonin Cefepime is completed ID following and steroids stared 3/2  Acute Anemia Recent Labs    08-27-19 0131 08/08/19 0248  HGB 8.6* 7.8*    Transfuse per protocol Consider stopping anticoagulation for DVT prophylaxis    Best practice:  Diet: regular diet - he is taking in very little po.  Pain/Anxiety/Delirium protocol (if indicated): n/a VAP protocol (if indicated):n/a DVT prophylaxis: lovenox GI prophylaxis: n/a Glucose control: monitor CBG Foley condom cath Mobility: bed rest Code Status: Full Family Communication: patient  reports a brother named Gwenlyn Perking in [place].  08/08/2019 patient updated at bedside will attempt to obtain contact information Disposition: needs ICU  Labs   CBC: Recent Labs  Lab 08/04/19 0228 08/05/19 0212 08/06/19 0123 08/26/2019 0131 08/08/19 0248  WBC 13.4* 16.2* 16.5* 23.7* 22.1*  NEUTROABS 10.8* 12.6* 13.0* 19.9* 18.4*  HGB 7.7* 7.9* 7.5* 8.6* 7.8*  HCT 21.4* 22.6* 21.6* 24.6* 22.5*  MCV 87.7 90.4 89.3 90.4 90.4  PLT 250 278 272 282 790    Basic Metabolic Panel: Recent Labs  Lab 08/04/19 0228 08/05/19 0212 08/06/19 0123 09/03/2019 0131 08/08/19 0248  NA 132* 132* 135 135 135  K 3.8 3.6 4.3 4.2 3.6  CL 99 98 101 102 101  CO2 23 24 25 22 23   GLUCOSE 95 125* 100* 98 101*  BUN 9 12 10 11 20   CREATININE 0.67 0.62 0.66 0.67 0.88  CALCIUM 8.2* 8.1* 8.2* 8.3* 8.3*   GFR: Estimated Creatinine Clearance: 78.3 mL/min (by C-G formula based on SCr of 0.88 mg/dL). Recent Labs  Lab 08/05/19 0212 08/06/19 0123 08/27/2019 0131 08/08/19 0248  WBC 16.2* 16.5* 23.7* 22.1*    Liver Function Tests: No results for input(s): AST, ALT, ALKPHOS, BILITOT, PROT, ALBUMIN in the last 168 hours.   App cct 30 min  Richardson Landry Lulubelle Simcoe ACNP Acute Care Nurse Practitioner [place] Please consult Amion 08/08/2019, 11:06 AM

## 2019-08-09 ENCOUNTER — Inpatient Hospital Stay (HOSPITAL_COMMUNITY): Payer: Medicaid Other

## 2019-08-09 LAB — POCT I-STAT 7, (LYTES, BLD GAS, ICA,H+H)
Acid-Base Excess: 3 mmol/L — ABNORMAL HIGH (ref 0.0–2.0)
Bicarbonate: 27 mmol/L (ref 20.0–28.0)
Calcium, Ion: 1.34 mmol/L (ref 1.15–1.40)
HCT: 26 % — ABNORMAL LOW (ref 39.0–52.0)
Hemoglobin: 8.8 g/dL — ABNORMAL LOW (ref 13.0–17.0)
O2 Saturation: 98 %
Patient temperature: 98
Potassium: 4.1 mmol/L (ref 3.5–5.1)
Sodium: 144 mmol/L (ref 135–145)
TCO2: 28 mmol/L (ref 22–32)
pCO2 arterial: 35.4 mmHg (ref 32.0–48.0)
pH, Arterial: 7.489 — ABNORMAL HIGH (ref 7.350–7.450)
pO2, Arterial: 90 mmHg (ref 83.0–108.0)

## 2019-08-09 LAB — BASIC METABOLIC PANEL
Anion gap: 13 (ref 5–15)
BUN: 35 mg/dL — ABNORMAL HIGH (ref 6–20)
CO2: 23 mmol/L (ref 22–32)
Calcium: 8.8 mg/dL — ABNORMAL LOW (ref 8.9–10.3)
Chloride: 108 mmol/L (ref 98–111)
Creatinine, Ser: 0.82 mg/dL (ref 0.61–1.24)
GFR calc Af Amer: >60 mL/min (ref >60)
GFR calc non Af Amer: >60 mL/min (ref >60)
Glucose, Bld: 128 mg/dL — ABNORMAL HIGH (ref 70–99)
Potassium: 4.4 mmol/L (ref 3.5–5.1)
Sodium: 144 mmol/L (ref 135–145)

## 2019-08-09 LAB — CBC WITH DIFFERENTIAL/PLATELET
Abs Immature Granulocytes: 0.28 10*3/uL — ABNORMAL HIGH (ref 0.00–0.07)
Basophils Absolute: 0 10*3/uL (ref 0.0–0.1)
Basophils Relative: 0 %
Eosinophils Absolute: 0 10*3/uL (ref 0.0–0.5)
Eosinophils Relative: 0 %
HCT: 24.3 % — ABNORMAL LOW (ref 39.0–52.0)
Hemoglobin: 8.2 g/dL — ABNORMAL LOW (ref 13.0–17.0)
Immature Granulocytes: 1 %
Lymphocytes Relative: 5 %
Lymphs Abs: 1.3 10*3/uL (ref 0.7–4.0)
MCH: 30.6 pg (ref 26.0–34.0)
MCHC: 33.7 g/dL (ref 30.0–36.0)
MCV: 90.7 fL (ref 80.0–100.0)
Monocytes Absolute: 1.7 10*3/uL — ABNORMAL HIGH (ref 0.1–1.0)
Monocytes Relative: 7 %
Neutro Abs: 22.3 10*3/uL — ABNORMAL HIGH (ref 1.7–7.7)
Neutrophils Relative %: 87 %
Platelets: 234 10*3/uL (ref 150–400)
RBC: 2.68 MIL/uL — ABNORMAL LOW (ref 4.22–5.81)
RDW: 13.2 % (ref 11.5–15.5)
WBC: 25.5 10*3/uL — ABNORMAL HIGH (ref 4.0–10.5)
nRBC: 0 % (ref 0.0–0.2)

## 2019-08-09 LAB — HEMOGLOBIN A1C
Hgb A1c MFr Bld: 6 % — ABNORMAL HIGH (ref 4.8–5.6)
Mean Plasma Glucose: 125.5 mg/dL

## 2019-08-09 LAB — MAGNESIUM: Magnesium: 2.3 mg/dL (ref 1.7–2.4)

## 2019-08-09 LAB — GLUCOSE, CAPILLARY
Glucose-Capillary: 108 mg/dL — ABNORMAL HIGH (ref 70–99)
Glucose-Capillary: 119 mg/dL — ABNORMAL HIGH (ref 70–99)
Glucose-Capillary: 142 mg/dL — ABNORMAL HIGH (ref 70–99)

## 2019-08-09 LAB — PHOSPHORUS: Phosphorus: 3.5 mg/dL (ref 2.5–4.6)

## 2019-08-09 LAB — PATHOLOGIST SMEAR REVIEW

## 2019-08-09 MED ORDER — SODIUM CHLORIDE 0.9 % IV SOLN: INTRAVENOUS | Status: DC

## 2019-08-09 MED ORDER — INSULIN ASPART 100 UNIT/ML ~~LOC~~ SOLN
0.0000 [IU] | Freq: Three times a day (TID) | SUBCUTANEOUS | Status: DC
  Administered 2019-08-10: 1 [IU] via SUBCUTANEOUS

## 2019-08-09 NOTE — Progress Notes (Signed)
[facility]    Date of Admission:  07/09/2019              ID: Charles Walters is a 59 y.o. male with interstitial pneumonia and  Principal Problem:   Acute respiratory failure with hypoxia ([place]) Active Problems:   Hyponatremia   Intractable hiccups   Diarrhea   Asthenia   Essential hypertension   Community acquired bacterial pneumonia   Acute interstitial pneumonia (HCC)   Leukocytosis   Sinus tachycardia   Tobacco abuse   Severe protein-calorie malnutrition (HCC)    Subjective: States he is more comfortable. Breathing less labored today. Down to 30 LPM heated high flow oxygen.   Planning cortrak tube for poor intake.   Steroids started 3/3   Medications:  . chlorhexidine  15 mL Mouth Rinse BID  . Chlorhexidine Gluconate Cloth  6 each Topical Daily  . enoxaparin (LOVENOX) injection  40 mg Subcutaneous QHS  . feeding supplement (ENSURE ENLIVE)  237 mL Oral TID BM  . insulin aspart  0-6 Units Subcutaneous TID WC  . Ipratropium-Albuterol  1 puff Inhalation Q6H  . mouth rinse  15 mL Mouth Rinse BID  . mouth rinse  15 mL Mouth Rinse q12n4p  . methylPREDNISolone (SOLU-MEDROL) injection  60 mg Intravenous Q24H  . multivitamin with minerals  1 tablet Oral Daily  . nicotine  7 mg Transdermal Daily  . pantoprazole (PROTONIX) IV  40 mg Intravenous Q24H  . sodium chloride flush  3 mL Intravenous Once  . thiamine injection  100 mg Intravenous Daily    Objective: Vital signs in last 24 hours: Temp:  [97 F (36.1 C)-98 F (36.7 C)] 98 F (36.7 C) (03/04 1527) Pulse Rate:  [97-130] 110 (03/04 1527) Resp:  [23-44] 23 (03/04 1527) BP: (90-137)/(59-87) 137/79 (03/04 1527) SpO2:  [70 %-100 %] 98 % (03/04 1506) FiO2 (%):  [80 %-100 %] 80 % (03/04 1527) Weight:  [59.7 kg] 59.7 kg (03/04 0415) Physical Exam  Constitutional: He is oriented to person, place, and time sitting up in bed resting. He appears well-developed and well-nourished. No distress.    HENT: bitemporal wasting.  Mouth/Throat: Oropharynx is clear and moist. No oropharyngeal exudate.  Cardiovascular: tachy, nl s1,s2 Exam reveals no gallop and no friction rub.  No murmur heard.  Pulmonary/Chest: Improved but still a bit tachy. No respiratory distress. He has no wheezes.  Abdominal: Soft. Bowel sounds are normal. He exhibits no distension. There is no tenderness.  Lymphadenopathy:  He has no cervical adenopathy.  Neurological: He is alert and oriented to person, place, and time.  Skin: Skin is warm and dry. No rash noted. No erythema.  Psychiatric: He has a normal mood and affect. His behavior is normal.     Lab Results Recent Labs    08/08/19 0248 08/08/19 0248 08/09/19 0158 08/09/19 1108  WBC 22.1*  --  25.5*  --   HGB 7.8*   < > 8.2* 8.8*  HCT 22.5*   < > 24.3* 26.0*  NA 135   < > 144 144  K 3.6   < > 4.4 4.1  CL 101  --  108  --   CO2 23  --  23  --   BUN 20  --  35*  --   CREATININE 0.88  --  0.82  --    < > = values in this interval not displayed.   Microbiology: reviewed Studies/Results: DG Chest [facility]  Result Date: 08/09/2019 CLINICAL DATA:  Shortness of breath, respiratory failure, smoker EXAM: PORTABLE CHEST 1 VIEW COMPARISON:  Portable exam 0534 hours compared to 08/08/2019 FINDINGS: Normal heart size mediastinal contours. Hazy infiltrates again identified throughout both lungs, predominantly mid lungs, slightly greater on RIGHT than LEFT, consistent with pneumonia. No pleural effusion or pneumothorax. Bones unremarkable. IMPRESSION: Persistent pulmonary infiltrates consistent with multifocal pneumonia. Electronically Signed   By: Lavonia Dana M.D.   On: 08/09/2019 09:12   DG CHEST PORT 1 VIEW  Result Date: 08/08/2019 CLINICAL DATA:  Dyspnea, hypoxia EXAM: PORTABLE CHEST 1 VIEW COMPARISON:  Chest radiograph from one day prior. FINDINGS: Stable cardiomediastinal silhouette with normal heart size. No pneumothorax. No pleural effusion. Extensive  patchy hazy opacity throughout both lungs, most prominent in right mid lung, not substantially changed. IMPRESSION: Stable chest radiograph with extensive patchy hazy opacity throughout both lungs, most prominent in the right mid lung. Electronically Signed   By: Ilona Sorrel M.D.   On: 08/08/2019 09:55     Assessment/Plan: Interstitial/atypical pneumonia = unclear etiology. Would plan for BAL if intubated or stabilizes to obtain sample. Showing some slight improvement in hypoxia and rate/work of breathing today but still tenuous. PCCM and [place] team following.   Respiratory distress = concern that he will eventually be fatigued, and require intubation  Tachycardia = improved as respiratory status is a bit better today   Will sign off for now - happy to see the patient back if there is a change in condition or concern over infectious contributor.   Janene Madeira, MSN, NP-C [facility] [email or web address] Pager: [phone] Office: [place]: [phone]   08/09/2019, 4:48 PM

## 2019-08-09 NOTE — Progress Notes (Signed)
[facility]    Date of Admission:  07/19/2019      ID: Charles Walters is a 59 y.o. male with  Principal Problem:   Acute respiratory failure with hypoxia (HCC) Active Problems:   Hyponatremia   Intractable hiccups   Diarrhea   Asthenia   Essential hypertension   Community acquired bacterial pneumonia   Acute interstitial pneumonia (HCC)   Leukocytosis   Sinus tachycardia   Tobacco abuse   Severe protein-calorie malnutrition (HCC)    Subjective: Afebrile, less short of breath this morning  ROS: 12 point ros is negative except for sob  Medications:  . chlorhexidine  15 mL Mouth Rinse BID  . Chlorhexidine Gluconate Cloth  6 each Topical Daily  . enoxaparin (LOVENOX) injection  40 mg Subcutaneous QHS  . feeding supplement (ENSURE ENLIVE)  237 mL Oral TID BM  . insulin aspart  0-6 Units Subcutaneous TID WC  . Ipratropium-Albuterol  1 puff Inhalation Q6H  . mouth rinse  15 mL Mouth Rinse BID  . mouth rinse  15 mL Mouth Rinse q12n4p  . methylPREDNISolone (SOLU-MEDROL) injection  60 mg Intravenous Q24H  . multivitamin with minerals  1 tablet Oral Daily  . nicotine  7 mg Transdermal Daily  . pantoprazole (PROTONIX) IV  40 mg Intravenous Q24H  . sodium chloride flush  3 mL Intravenous Once  . thiamine injection  100 mg Intravenous Daily    Objective: Vital signs in last 24 hours: Temp:  [97 F (36.1 C)-98 F (36.7 C)] 98 F (36.7 C) (03/04 1527) Pulse Rate:  [97-130] 110 (03/04 1527) Resp:  [23-44] 23 (03/04 1527) BP: (90-137)/(59-87) 137/79 (03/04 1527) SpO2:  [70 %-100 %] 98 % (03/04 1506) FiO2 (%):  [80 %-100 %] 80 % (03/04 1527) Weight:  [59.7 kg] 59.7 kg (03/04 0415) Physical Exam  Constitutional: He is oriented to person, place, and time. He appears well-developed and well-nourished. No distress.  HENT: bitemporal wasting, wearing hiflo 30% Mouth/Throat: Oropharynx is clear and moist. No oropharyngeal exudate.  Cardiovascular: tachy,  regular rhythm and normal heart sounds. Exam reveals no gallop and no friction rub.  No murmur heard.  Pulmonary/Chest: Effort normal and breath sounds normal. No respiratory distress. He has no wheezes.  Abdominal: Soft. Bowel sounds are normal. He exhibits no distension. There is no tenderness.  Lymphadenopathy:  He has no cervical adenopathy.  Neurological: He is alert and oriented to person, place, and time.  Skin: Skin is warm and dry. No rash noted. No erythema.  Psychiatric: He has a normal mood and affect. His behavior is normal.     Lab Results Recent Labs    08/08/19 0248 08/08/19 0248 08/09/19 0158 08/09/19 1108  WBC 22.1*  --  25.5*  --   HGB 7.8*   < > 8.2* 8.8*  HCT 22.5*   < > 24.3* 26.0*  NA 135   < > 144 144  K 3.6   < > 4.4 4.1  CL 101  --  108  --   CO2 23  --  23  --   BUN 20  --  35*  --   CREATININE 0.88  --  0.82  --    < > = values in this interval not displayed.    Microbiology: reviewed Studies/Results: DG Chest Port 1 View  Result Date: 08/09/2019 CLINICAL DATA:  Shortness of breath, respiratory failure, smoker EXAM: PORTABLE CHEST 1 VIEW COMPARISON:  Portable exam 0534 hours  compared to 08/08/2019 FINDINGS: Normal heart size mediastinal contours. Hazy infiltrates again identified throughout both lungs, predominantly mid lungs, slightly greater on RIGHT than LEFT, consistent with pneumonia. No pleural effusion or pneumothorax. Bones unremarkable. IMPRESSION: Persistent pulmonary infiltrates consistent with multifocal pneumonia. Electronically Signed   By: Lavonia Dana M.D.   On: 08/09/2019 09:12   DG CHEST PORT 1 VIEW  Result Date: 08/08/2019 CLINICAL DATA:  Dyspnea, hypoxia EXAM: PORTABLE CHEST 1 VIEW COMPARISON:  Chest radiograph from one day prior. FINDINGS: Stable cardiomediastinal silhouette with normal heart size. No pneumothorax. No pleural effusion. Extensive patchy hazy opacity throughout both lungs, most prominent in right mid lung, not  substantially changed. IMPRESSION: Stable chest radiograph with extensive patchy hazy opacity throughout both lungs, most prominent in the right mid lung. Electronically Signed   By: Ilona Sorrel M.D.   On: 08/08/2019 09:55     Assessment/Plan: Interstitial pneumonia = continue with steroids. Will defer to pulmonary for duration. Continue to hold off on any further antibiotics at this time. If he were to be on pred 20mg  daily > 30 days, then would do bactrim DS daily for oi proph  Will sign off  [facility] for Infect[facility] Diseases Cell: [phone] Pager: [phone]  08/09/2019, 4:48 PM

## 2019-08-09 NOTE — Progress Notes (Addendum)
Nutrition Follow-up  DOCUMENTATION CODES:   Underweight, Severe malnutrition in context of chronic illness  INTERVENTION:   Recommend downgrading to Dysphagia 3 (easy to chew diet) until SLP able to assess and make further recommendations  Recommend placing Cortrak tube tomorrow (service available Tuesday and Friday) with initiation of TF; plan was discussed with CCM team and agreeable to Cortrak, orders placed   Tube Feeding:  Osmolite 1.2 at 20 ml/hr, goal rate of 70 ml/hr Initiate TF at rate of 20 ml/hr; titrate by 10 mL q 8 hours until goal rate of 70 ml/hr Provides 2016 kcals, 93 g of protein and 1361 mL of free water  Monitor magnesium, potassium, and phosphorus for at least 5 occurrences, MD to replete as needed, as pt is at risk for refeeding syndrome given chronic severe malnutrition, prolonged poor po intake   Continue MVI with Minerals   NUTRITION DIAGNOSIS:   Severe Malnutrition related to chronic illness as evidenced by severe fat depletion, severe muscle depletion.  Being addressed via nutrition support  GOAL:   Patient will meet greater than or equal to 90% of their needs  Progressing  MONITOR:   PO intake, Supplement acceptance, Weight trends, Labs, I & O's  REASON FOR ASSESSMENT:   Malnutrition Screening Tool    ASSESSMENT:   Patient with PMH significant for HTN. Presents this admission with hyponatremia.   Pt currently on HFNC, 30L/min. Upon assessment on rounds today, pt had taken HFNC off and sats in 70s. RD assisted pt with placing HFNC back on, sats soon in upper 90s   Pt groggy but arouseable, pt appears weak.   MD holding off on any plans for intubation at this time  Pt ate 0% at breakfast this AM.  Per RN, pt coughing with attempted bites this AM, SLP has been consulted.   Recorded po intake 0% of meals since 2/27 (at least 5 days). No po intake recorded on 2/26, recorded po on 2/25 40-80%.   Current wt 59.7 kg; no recent weight  encounters. Pt is UNDERWEIGHT based on BMI and meets clinical characteristics for severe malnutrition.   Recommend Cortrak placement with initiation of TF at this time. Discussed with CCM Team and received verbal order for Cortrak order and TF  Labs: reviewed Meds: NS at 100 ml/hr, ss novolog, solumedrol, reglan prn, MVI with Minerals, thiamine  NUTRITION - FOCUSED PHYSICAL EXAM:  Physical exam performed. Pt with severe muscle wasting and subcutaneous fat losses on exam.    Diet Order:   Diet Order            DIET DYS 3 Room service appropriate? Yes; Fluid consistency: Thin  Diet effective now        Diet - low sodium heart healthy              EDUCATION NEEDS:   Not appropriate for education at this time  Skin:  Skin Assessment: Reviewed RN Assessment  Last BM:  2/28  Height:   Ht Readings from Last 1 Encounters:  08/12/2019 6\' 1"  (1.854 m)    Weight:   Wt Readings from Last 1 Encounters:  08/09/19 59.7 kg    Ideal Body Weight:  83.6 kg  BMI:  Body mass index is 17.36 kg/m.  Estimated Nutritional Needs:   Kcal:  1900-2100 kcals  Protein:  90-110 g  Fluid:  >/= 2 L/day   10/09/19 MS, RDN, LDN, CNSC RD Pager Number and Weekend/On-Call After Hours Pager Located in [place]

## 2019-08-09 NOTE — Plan of Care (Signed)
  Problem: Clinical Measurements: Goal: Ability to maintain clinical measurements within normal limits will improve Outcome: Progressing Goal: Respiratory complications will improve Outcome: Progressing Note: Very slowly weaning from HFNC.   Problem: Elimination: Goal: Will not experience complications related to urinary retention Outcome: Progressing   Problem: Nutrition: Goal: Adequate nutrition will be maintained Outcome: Not Progressing Note: Still with poor appetite.

## 2019-08-09 NOTE — Progress Notes (Signed)
SLP Cancellation Note  Patient Details Name: Charles Walters MRN: [id number] DOB: Jan 19, 1961   Cancelled treatment:    Orders received for swallow evaluation; spoke with RN - respiratory status is tenuous.  Will f/u next date for readiness.  Sawyer Mentzer L. Samson Frederic, MA CCC/SLP Acute Rehabilitation Services Office number [phone] Pager [phone]        Blenda Mounts Laurice 08/09/2019, 11:03 AM

## 2019-08-09 NOTE — Progress Notes (Signed)
PULMONARY / CRITICAL CARE MEDICINE   NAME:  Charles Walters, MRN:  [id number], DOB:  12-Jun-1960, LOS: 61 ADMISSION DATE:  07/17/2019, CONSULTATION DATE:  07/30/2019 REFERRING MD:  Grandville Silos, CHIEF COMPLAINT:  Hypoxemic respiratory failure  BRIEF HISTORY:    The patient is a 59 year old gentleman who presented 08/03/2019 with hiccups, loss of appetite, hyponatremia, watery diarrhea.  He was also short of breath on exertion.  He had a grossly abnormal chest x-ray and was found to be incidentally hypoxemic.  He was seen initially by pulmonary critical care team and treated empirically for community-acquired pneumonia.  However over the last week he has continued to decline, had worsening hypoxemia, and had a repeat CT chest which shows persistent bilateral lower lobe predominant groundglass opacities with interlobular septal thickening.  Is being called back to evaluate these opacities in the setting of hypoxemic respiratory failure.  SIGNIFICANT PAST MEDICAL HISTORY   Hypertension  SIGNIFICANT EVENTS:  2/22: PCCM consulted and recommended for CAP coverage 3/1: PCCM consulted for persistent infiltrates and hypoxemia, planned for bronch AM of 3/2 for which transferred to ICU; bronch cancelled for worsening O2 requirements   STUDIES:   2/17: CT Chest wo Contrast: multifocal pneumonia, viral vs atypical; mild aneurysmal dilation of ascending aorta up to 4.2cm  2/18: CT Angio Chest: No PE; nonspecific multifocal pneumonia of atypical etiology 3/1: CT Chest wo Contrast: extensive worsening crazy paving pattern (ground glass opacity + interlobular septal thickening) throughout peripheral lungs bilaterally involving all lung lobes; new patchy regions of consolidative airspace disease in lungs bilaterally; most prominent in lower lobes. Given interval worsening, considerations include acute interstitial pneumonia/ARDS, atypical infection or pulmonary alveolar proteinosis; small dependent bilateral pleural effusions;  dilated main pulmonary artery suggesting pulmonary arterial hypertension; stable ascending thoracic aorta aneurysm.   CULTURES:  Cultures no growth to date Respiratory virus panel negative MRSA PCR negative Covid negative 3/3: Blood cultures x2> neg to date 3/3: Urine legionella>  3/3: Urine strep>   ANTIBIOTICS:  Vancomycin 2/22> 3/2 Cefepime 2/25> 3/2 Bactrim 3/2 Azithromycin 3/2  CONSULTANTS:  PCCM  SUBJECTIVE:  This morning, patient is awake and alert. He notes that he is "doing okay" and denies any pain.   CONSTITUTIONAL: BP 110/73   Pulse (!) 115   Temp 98 F (36.7 C) (Oral)   Resp (!) 40   Ht 6\' 1"  (1.854 m)   Wt 59.7 kg   SpO2 93%   BMI 17.36 kg/m   I/O last 3 completed shifts: In: 187 [IV Piggyback:187] Out: 2470 [Urine:2470]     FiO2 (%):  [80 %-100 %] 80 %  CBC Latest Ref Rng & Units 08/09/2019 08/08/2019 19-Aug-2019  WBC 4.0 - 10.5 K/uL 25.5(H) 22.1(H) 23.7(H)  Hemoglobin 13.0 - 17.0 g/dL 8.2(L) 7.8(L) 8.6(L)  Hematocrit 39.0 - 52.0 % 24.3(L) 22.5(L) 24.6(L)  Platelets 150 - 400 K/uL 234 241 282   CMP Latest Ref Rng & Units 08/09/2019 08/08/2019 08/19/2019  Glucose 70 - 99 mg/dL 128(H) 101(H) 98  BUN 6 - 20 mg/dL 35(H) 20 11  Creatinine 0.61 - 1.24 mg/dL 0.82 0.88 0.67  Sodium 135 - 145 mmol/L 144 135 135  Potassium 3.5 - 5.1 mmol/L 4.4 3.6 4.2  Chloride 98 - 111 mmol/L 108 101 102  CO2 22 - 32 mmol/L 23 23 22   Calcium 8.9 - 10.3 mg/dL 8.8(L) 8.3(L) 8.3(L)  Total Protein 6.5 - 8.1 g/dL - - -  Total Bilirubin 0.3 - 1.2 mg/dL - - -  Alkaline Phos 38 - 126  U/L - - -  AST 15 - 41 U/L - - -  ALT 0 - 44 U/L - - -   PHYSICAL EXAM: General:  Ill appearing elderly male with increased work of breathing Neuro:  Awake, alert and oriented x3, no focal deficits noted HEENT:  [place]/AT; EOMI conjunctiva wnl Cardiovascular:  Sinus tachycardia, S1 and S2 present  Lungs:  Diffuse crackles with coarse breath sounds at bilateral bases; poor air movement; tachypneic w/RR  34-35 and accessory muscle usage Abdomen:  Soft, nontender, nondistended, normoactive bowel sounds Musculoskeletal:  Nontender, nonedematous  Skin:  Warm, dry, nondiaphoretic  RESOLVED PROBLEM LIST   ASSESSMENT AND PLAN   Acute hypoxemic respiratory failure 2/2 atypical/viral CAP vs acute interstitial pneumonitis vs ARDS Patient presented with dyspnea on exertion and found to be hypoxemic with grossly abnormal CXR. Initially treated for CAP; however, worsening hypoxemia and repeat CT Chest w/persistent bilateral lower lobe predominant ground glass opacities w/interlobular septal thickening. He has completed 2 week course of empiric antibiotics on 3/2. RVP, MRSA, COVID negative. Urine legionella and strep pending. Patient on IV steroids for possible acute interstitial pneumonitis vs ARDS.  Patient worsening hypoxic respiratory status currently on 25L HFNC w/80% FiO2 and saturating well; however, very tachypneic w/ RR 35. He is using accessory muscles of respiration.  - ABG - IV solumedrol 56mh q24h - Patient needs bronchoscopy; however, too unstable for this unless intubated - Continue to monitor  Acute metabolic encephalopathy likely 2/2 delirium and respiratory failure Currently alert and oriented x3.  - Continue to monitor   Severe protein calorie malnutrition BMI 17. Chronically ill appearing male with temporal wasting. Patient currently has very little PO intake. May need feeding supplementation via feeding tube - Dietitian to order nutritional supplements  Leukocytosis w/bandemia: Patient with persistent low grade fevers. He has completed two week course of empiric antibiotics and has received 1 dose of bactrim and azithromycin. Noted to have worsening leukocytosis this morning 22.1>22.5; however, did receive steroids yesterday.    Best Practice / Goals of Care / Disposition.   Diet: regular diet - he is taking in very little po. Pain/Anxiety/Delirium protocol (if indicated):  n/a VAP protocol (if indicated):n/a DVT prophylaxis: lovenox GI prophylaxis: n/a Glucose control: monitor CBG Foley condom cath Mobility: bed rest Code Status: Full Family Communication: Patient's daughter in [place] updated via phone 3/3 Disposition: ICU  LABS  Glucose No results for input(s): GLUCAP in the last 168 hours.  BMET Recent Labs  Lab 23-Aug-2019 0131 08/08/19 0248 08/09/19 0158  NA 135 135 144  K 4.2 3.6 4.4  CL 102 101 108  CO2 22 23 23   BUN 11 20 35*  CREATININE 0.67 0.88 0.82  GLUCOSE 98 101* 128*    Liver Enzymes No results for input(s): AST, ALT, ALKPHOS, BILITOT, ALBUMIN in the last 168 hours.  Electrolytes Recent Labs  Lab 08/23/2019 0131 08/08/19 0248 08/09/19 0158  CALCIUM 8.3* 8.3* 8.8*  MG  --   --  2.3  PHOS  --   --  3.5    CBC Recent Labs  Lab 08-23-19 0131 08/08/19 0248 08/09/19 0158  WBC 23.7* 22.1* 25.5*  HGB 8.6* 7.8* 8.2*  HCT 24.6* 22.5* 24.3*  PLT 282 241 234    ABG Recent Labs  Lab 08/23/19 0410  PHART 7.493*  PCO2ART 31.9*  PO2ART 69.2*    Coag's No results for input(s): APTT, INR in the last 168 hours.  Sepsis Markers Recent Labs  Lab 08/08/19 0248  PROCALCITON  3.64    Cardiac Enzymes No results for input(s): TROPONINI, PROBNP in the last 168 hours.   Eliezer Bottom, MD Internal Medicine, PGY-1 08/09/19   9:57 AM

## 2019-08-09 NOTE — Progress Notes (Addendum)
PROGRESS NOTE    Charles Walters  XBJ:[id number] DOB: 04-18-1961 DOA: 07/17/2019 PCP: Marliss Coots, NP    Brief Narrative:  Patient was admitted to the hospital with a working diagnosis of community-acquired pneumonia complicated by acute hypoxic respiratory failure and severe hyponatremia.Patient with initially worsening pneumonia suspected multidrug resistant bacteria/ atypical pneumoniaor acute interstitial pneumonia  59 year old male who presented with hiccups. He does have significant past medical history for hypertension. He reported about 1 week of intractable hiccups associated with loss of appetite, and watery diarrhea. Positive dyspnea on exertion. On his initial physical examination blood pressure 156/119, heart rate 108, respiratory rate 27, oxygen saturation 98% on supplemental oxygen, his lungs had coarse breath sounds bilaterally, no wheezing, heart S1-S2 present and rhythmic, abdomen was soft and nontender, no lower extremity edema. Sodium 114, potassium 3.0, chloride 80, bicarb 17, glucose 126, BUN 7, creatinine 0.77, white count 10.9, hemoglobin 12.3, hematocrit 33.1, platelets 235. SARS COVID-19 negative. Urinary osmolarity 507, urine sodium <10, alcohol less than 10.His chest radiograph had bilateral bibasilar reticulonodular infiltrates, predominantly right base. EKG 128 bpm, normal axis, normal intervals, right atrial enlargement, sinus rhythm, poor R wave progression, LVH, no ST segment T wave changes.  Patient received hypertonic saline and intravenous antibiotics, further work-up with CT chest negative for pulmonary embolism, echocardiography with preserved LV and RV systolic function.  His discharge was cancelled due to worsening hypoxemic respiratory failure, along with worsening pulmonary radiographic changes,bilateral infiltrates at bases, more dense on the leftbase, . Resumed antibiotic therapy with vancomycin and cefepime, suspected multidrug resistant  bacterial pneumonia/ atypical pneumonia or acute interstitial process.  Patient with rapid worsening respiratory failure despite broad spectrum antibiotic therapy, follow CT chest with worsening infiltrates, suspected acute interstitial pneumonia/ pneumocystis.   03/02 Transferred to ICU for heated high flow nasal cannula therapy.      Assessment & Plan:   Principal Problem:   Acute respiratory failure with hypoxia (HCC) Active Problems:   Hyponatremia   Intractable hiccups   Diarrhea   Asthenia   Essential hypertension   Community acquired bacterial pneumonia   Acute interstitial pneumonia (HCC)   Leukocytosis   Sinus tachycardia   Tobacco abuse   Severe protein-calorie malnutrition (HCC)  1 acute hypoxemic respiratory failure Initially felt to be secondary to community-acquired pneumonia (atypical/viral) bilaterally.??  Acute interstitial pneumonitis versus ARDS. Patient with worsening hypoxic respiratory status/failure currently on high flow nasal cannula at 25 L/min with 80% FiO2 with sats of 93% and tachypneic with a respiratory rate of 39.  Patient with use of accessory muscles of respiration.  ABG done on 08/18/2019 with a pH of 7.43/PCO2 of 32/PO2 of 69/bicarb of 24.  Repeat CT chest done on 08/06/2019 with extensive worsening crazy paving pattern (groundglass opacity plus interlobular septal thickening) throughout the peripheral lungs bilaterally involving all lung lobes.  New patchy regions of consolidative airspace disease in the lungs bilaterally most prominent in the lower lobes.  Given interval worsening considerations include acute interstitial pneumonia/ARDS, atypical infection of pulmonary alveolar proteinosis.  Small dependent bilateral pleural effusions.  Dilated pulmonary arteries suggesting pulmonary arterial hypertension.  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  Status post 2-week course of empiric IV vancomycin, Levaquin, cefepime.  Patient received a dose  of IV azithromycin and a dose of IV Bactrim.  Still with a leukocytosis of 25.  Repeat chest x-ray with persistent pulmonary infiltrates consistent with multifocal pneumonia. Respiratory viral panel negative.  MRSA PCR negative.  SARS coronavirus 2 PCR negative.  Patient seen in consultation by ID and who had recommended finishing 14-day course of antibiotics which was completed 09/04/2019.  Urine Legionella antigen and strep pneumococcus antigen pending.  ID recommending bronchoscopy if patient was to get intubated due to worsening respiratory failure.  Pulmonary consulted as well and are following and were in agreement with discontinuation of antibiotics on 08/15/2019.  Patient started on IV steroids which we will continue.  Patient with worsening respiratory status will likely get fatigued and likely be intubated.  Continue flutter valve.  Incentive spirometry.  Patient with worsening respiratory status may need intubation however will defer to PCCM.  Pulmonary and ID following and appreciate input and recommendations.  2.  Severe protein calorie malnutrition When tolerating oral intake will need nutritional supplementation.  3.  Leukocytosis with bandemia Patient noted to have a bandemia.  Patient was pancultured with negative blood cultures from 08/02/2019.  MRSA PCR negative.  Respiratory viral panel negative.  SARS coronavirus 2 PCR negative.  Status post 14 days of broad-spectrum antibiotics which included vancomycin, cefepime, Bactrim, azithromycin.  Leukocytosis currently at 25.5.  ID following.  Follow.  4.  Severe hypovolemic hypoosmolar hyponatremia Resolved.  Felt secondary to hypovolemia in the setting of ARB and HCTZ.  Currently resolved.  Sodium at 144.  Follow renal function, electrolytes.  Avoid hypotension.  Follow.  Nephrology was following and have signed off.  5.  Hypertension Stable.  Blood pressure 119/77.  Continue to hold antihypertensive agents.   6.  Sinus tachycardia Likely  secondary to problem #1.  Lopressor 5 mg IV as needed.  Follow.  7.  Tobacco abuse/alcohol use Per Dr. Arrien patient drinks alcohol at home daily 1 beer daily and smokes 3 to 4 cigarettes daily.  Nicotine patch.  Multivitamin, thiamine.  Patient with no signs of withdrawal.  Follow.    8.  Acute diarrhea Resolved.   DVT prophylaxis: Lovenox Code Status: Full Family Communication: Updated patient. Updated daughter Shiquana Forrer via telephone. Disposition Plan:  . Patient came from: Home            . Anticipated d/c place: To be determined.  Home versus SNF . Barriers to d/c OR conditions which need to be met to effect a safe d/c: To be determined.  Home versus SNF when clinically improved with no further O2 requirements and cleared by pulmonary and ID.   Consultants:   Nephrology: Dr. Coladonato 08/03/2019  Pulmonary and critical care medicine: Dr. Ellison 07/30/2019  Pulmonary critical care medicine: Dr. Desai   Infectious disease: Dr. Snider 08/22/2019  Procedures:   CT chest 07/28/2019, 08/06/2019  CT angiogram chest 07/26/2019  2D echo 07/26/2019    Antimicrobials:   IV Levaquin 07/28/2019>>>>> 08/02/2019  IV azithromycin 03/10/2021x1 dose  IV cefepime 08/02/2019>>>> 08/10/2019  IV Bactrim 08/07/2018 21x1 dose  IV vancomycin 07/30/2019>>>>> 08/07/2019   Subjective: Patient sitting up in bed on 25 L heated high flow O2 with sats of 93% with use of accessory muscles of respiration.  Patient looks very weak and tired.   Objective: Vitals:   08/09/19 0800 08/09/19 0830 08/09/19 0837 08/09/19 0900  BP: 98/70   110/73  Pulse: (!) 108 (!) 110  (!) 115  Resp: (!) 25 (!) 31  (!) 40  Temp:   98 F (36.7 C)   TempSrc:   Oral   SpO2: 100%   93%  Weight:      Height:        Intake/Output Summary (Last 24 hours) at 08/09/2019   0919 Last data filed at 08/09/2019 0800 Gross per 24 hour  Intake 0 ml  Output 1390 ml  Net -1390 ml   Filed Weights   07/26/19 0000 08/06/2019  0459 08/09/19 0415  Weight: 64.4 kg 59.8 kg 59.7 kg    Examination:  General exam: On 25 L of heated high flow nasal cannula O2 with FiO2 of 80% with sats of 93%.  Use of accessory muscles of respiration.  Increased respiratory rate.  Frail.  Cachectic.  Temporal wasting Respiratory system: Coarse breath sounds in the bases.  No wheezing.  Poor air movement.  Tachypneic.  Use of accessory muscles of respiration.  Cardiovascular system: Tachycardia. No JVD, murmurs, rubs, gallops or clicks. No pedal edema. Gastrointestinal system: Abdomen is soft, nontender, nondistended, positive bowel sounds.  No rebound.  No guarding.  Central nervous system: Alert and somewhat lethargic.  Moving extremities spontaneously. Extremities: Symmetric 5 x 5 power. Skin: No rashes, lesions or ulcers Psychiatry: Judgement and insight appear poor to fair fair. Mood & affect appropriate.     Data Reviewed: I have personally reviewed following labs and imaging studies  CBC: Recent Labs  Lab 08/05/19 0212 08/06/19 0123 08/12/2019 0131 08/08/19 0248 08/09/19 0158  WBC 16.2* 16.5* 23.7* 22.1* 25.5*  NEUTROABS 12.6* 13.0* 19.9* 18.4* 22.3*  HGB 7.9* 7.5* 8.6* 7.8* 8.2*  HCT 22.6* 21.6* 24.6* 22.5* 24.3*  MCV 90.4 89.3 90.4 90.4 90.7  PLT 278 272 282 241 234   Basic Metabolic Panel: Recent Labs  Lab 08/05/19 0212 08/06/19 0123  0131 08/08/19 0248 08/09/19 0158  NA 132* 135 135 135 144  K 3.6 4.3 4.2 3.6 4.4  CL 98 101 102 101 108  CO2 24 25 22 23 23  GLUCOSE 125* 100* 98 101* 128*  BUN 12 10 11 20 35*  CREATININE 0.62 0.66 0.67 0.88 0.82  CALCIUM 8.1* 8.2* 8.3* 8.3* 8.8*  MG  --   --   --   --  2.3  PHOS  --   --   --   --  3.5   GFR: Estimated Creatinine Clearance: 83.9 mL/min (by C-G formula based on SCr of 0.82 mg/dL). Liver Function Tests: No results for input(s): AST, ALT, ALKPHOS, BILITOT, PROT, ALBUMIN in the last 168 hours. No results for input(s): LIPASE, AMYLASE in the last  168 hours. No results for input(s): AMMONIA in the last 168 hours. Coagulation Profile: No results for input(s): INR, PROTIME in the last 168 hours. Cardiac Enzymes: No results for input(s): CKTOTAL, CKMB, CKMBINDEX, TROPONINI in the last 168 hours. BNP (last 3 results) No results for input(s): PROBNP in the last 8760 hours. HbA1C: No results for input(s): HGBA1C in the last 72 hours. CBG: No results for input(s): GLUCAP in the last 168 hours. Lipid Profile: No results for input(s): CHOL, HDL, LDLCALC, TRIG, CHOLHDL, LDLDIRECT in the last 72 hours. Thyroid Function Tests: No results for input(s): TSH, T4TOTAL, FREET4, T3FREE, THYROIDAB in the last 72 hours. Anemia Panel: No results for input(s): VITAMINB12, FOLATE, FERRITIN, TIBC, IRON, RETICCTPCT in the last 72 hours. Sepsis Labs: Recent Labs  Lab 08/08/19 0248  PROCALCITON 3.64    Recent Results (from the past 240 hour(s))  Culture, blood (Routine X 2) w Reflex to ID Panel     Status: None   Collection Time: 07/30/19  6:00 PM   Specimen: BLOOD  Result Value Ref Range Status   Specimen Description BLOOD LEFT ANTECUBITAL  Final   Special Requests   Final      BOTTLES DRAWN AEROBIC AND ANAEROBIC Blood Culture adequate volume   Culture   Final    NO GROWTH 5 DAYS Performed at [facility], [address] [place], [place] [address]    Report Status 08/04/2019 FINAL  Final  Culture, blood (Routine X 2) w Reflex to ID Panel     Status: None   Collection Time: 07/30/19  6:07 PM   Specimen: BLOOD LEFT HAND  Result Value Ref Range Status   Specimen Description BLOOD LEFT HAND  Final   Special Requests   Final    BOTTLES DRAWN AEROBIC AND ANAEROBIC Blood Culture adequate volume   Culture   Final    NO GROWTH 5 DAYS Performed at [facility], [address] [place], [place] [address]    Report Status 08/04/2019 FINAL  Final  Culture, blood (routine x 2)     Status: None   Collection Time: 08/02/19  5:50 PM    Specimen: BLOOD  Result Value Ref Range Status   Specimen Description BLOOD LEFT ANTECUBITAL  Final   Special Requests   Final    BOTTLES DRAWN AEROBIC AND ANAEROBIC Blood Culture adequate volume   Culture   Final    NO GROWTH 5 DAYS Performed at [facility], [address] [place], [place] [address]    Report Status 08/30/2019 FINAL  Final  Culture, blood (routine x 2)     Status: None   Collection Time: 08/02/19  6:00 PM   Specimen: BLOOD RIGHT HAND  Result Value Ref Range Status   Specimen Description BLOOD RIGHT HAND  Final   Special Requests   Final    BOTTLES DRAWN AEROBIC AND ANAEROBIC Blood Culture adequate volume   Culture   Final    NO GROWTH 5 DAYS Performed at [facility], [address] [place], [place] [address]    Report Status 08/27/2019 FINAL  Final  MRSA PCR Screening     Status: Abnormal   Collection Time: 08/03/19  6:30 PM   Specimen: Nasal Mucosa; Nasopharyngeal  Result Value Ref Range Status   MRSA by PCR (A) NEGATIVE Final    INVALID, UNABLE TO DETERMINE THE PRESENCE OF TARGET DUE TO SPECIMEN INTEGRITY. RECOLLECTION REQUESTED.    Comment: T BULLOCK RN 2135 08/03/19 A BROWNING Performed at [facility], [address] [place], [place] [address]   MRSA PCR Screening     Status: None   Collection Time: 08/30/2019  5:01 AM   Specimen: Nasal Mucosa; Nasopharyngeal  Result Value Ref Range Status   MRSA by PCR NEGATIVE NEGATIVE Final    Comment:        The GeneXpert MRSA Assay (FDA approved for NASAL specimens only), is one component of a comprehensive MRSA colonization surveillance program. It is not intended to diagnose MRSA infection nor to guide or monitor treatment for MRSA infections. Performed at [facility], [address] [place], [place] [address]   Culture, blood (Routine X 2) w Reflex to ID Panel     Status: None (Preliminary result)   Collection Time: 08/08/19  2:08 PM   Specimen: BLOOD  Result Value Ref Range  Status   Specimen Description BLOOD RIGHT ANTECUBITAL  Final   Special Requests   Final    BOTTLES DRAWN AEROBIC ONLY Blood Culture results may not be optimal due to an inadequate volume of blood received in culture bottles   Culture   Final      NO GROWTH < 24 HOURS Performed at [facility] [address], [place], [place] [address]    Report Status PENDING  Incomplete  Culture, blood (Routine X 2) w Reflex to ID Panel     Status: None (Preliminary result)   Collection Time: 08/08/19  2:09 PM   Specimen: BLOOD LEFT WRIST  Result Value Ref Range Status   Specimen Description BLOOD LEFT WRIST  Final   Special Requests   Final    BOTTLES DRAWN AEROBIC ONLY Blood Culture results may not be optimal due to an inadequate volume of blood received in culture bottles   Culture   Final    NO GROWTH < 24 HOURS Performed at [facility], [place] [address], [place], [place] [address]    Report Status PENDING  Incomplete         Radiology Studies: DG Chest Port 1 View  Result Date: 08/09/2019 CLINICAL DATA:  Shortness of breath, respiratory failure, smoker EXAM: PORTABLE CHEST 1 VIEW COMPARISON:  Portable exam 0569 hours compared to 08/08/2019 FINDINGS: Normal heart size mediastinal contours. Hazy infiltrates again identified throughout both lungs, predominantly mid lungs, slightly greater on RIGHT than LEFT, consistent with pneumonia. No pleural effusion or pneumothorax. Bones unremarkable. IMPRESSION: Persistent pulmonary infiltrates consistent with multifocal pneumonia. Electronically Signed   By: Lavonia Dana M.D.   On: 08/09/2019 09:12   DG CHEST PORT 1 VIEW  Result Date: 08/08/2019 CLINICAL DATA:  Dyspnea, hypoxia EXAM: PORTABLE CHEST 1 VIEW COMPARISON:  Chest radiograph from one day prior. FINDINGS: Stable cardiomediastinal silhouette with normal heart size. No pneumothorax. No pleural effusion. Extensive patchy hazy opacity throughout both lungs, most prominent in right mid lung,  not substantially changed. IMPRESSION: Stable chest radiograph with extensive patchy hazy opacity throughout both lungs, most prominent in the right mid lung. Electronically Signed   By: Ilona Sorrel M.D.   On: 08/08/2019 09:55        Scheduled Meds: . chlorhexidine  15 mL Mouth Rinse BID  . Chlorhexidine Gluconate Cloth  6 each Topical Daily  . enoxaparin (LOVENOX) injection  40 mg Subcutaneous QHS  . feeding supplement (ENSURE ENLIVE)  237 mL Oral TID BM  . Ipratropium-Albuterol  1 puff Inhalation Q6H  . mouth rinse  15 mL Mouth Rinse BID  . mouth rinse  15 mL Mouth Rinse q12n4p  . methylPREDNISolone (SOLU-MEDROL) injection  60 mg Intravenous Q24H  . multivitamin with minerals  1 tablet Oral Daily  . nicotine  7 mg Transdermal Daily  . pantoprazole (PROTONIX) IV  40 mg Intravenous Q24H  . sodium chloride flush  3 mL Intravenous Once  . thiamine injection  100 mg Intravenous Daily   Continuous Infusions:   LOS: 15 days    Time spent: 45 minutes    Irine Seal, MD [facility]   To contact the attending provider between 7A-7P or the covering provider during after hours 7P-7A, please log into the web site [email or web address] and access using universal [place] password for that web site. If you do not have the password, please call the hospital operator.  08/09/2019, 9:19 AM

## 2019-08-10 ENCOUNTER — Inpatient Hospital Stay (HOSPITAL_COMMUNITY): Payer: Medicaid Other

## 2019-08-10 DIAGNOSIS — Z720 Tobacco use: Secondary | ICD-10-CM

## 2019-08-10 DIAGNOSIS — M7989 Other specified soft tissue disorders: Secondary | ICD-10-CM

## 2019-08-10 DIAGNOSIS — R600 Localized edema: Secondary | ICD-10-CM

## 2019-08-10 LAB — POCT I-STAT 7, (LYTES, BLD GAS, ICA,H+H)
Acid-Base Excess: 3 mmol/L — ABNORMAL HIGH (ref 0.0–2.0)
Bicarbonate: 27.3 mmol/L (ref 20.0–28.0)
Calcium, Ion: 1.37 mmol/L (ref 1.15–1.40)
HCT: 22 % — ABNORMAL LOW (ref 39.0–52.0)
Hemoglobin: 7.5 g/dL — ABNORMAL LOW (ref 13.0–17.0)
O2 Saturation: 100 %
Patient temperature: 98.6
Potassium: 4 mmol/L (ref 3.5–5.1)
Sodium: 144 mmol/L (ref 135–145)
TCO2: 29 mmol/L (ref 22–32)
pCO2 arterial: 42.4 mmHg (ref 32.0–48.0)
pH, Arterial: 7.417 (ref 7.350–7.450)
pO2, Arterial: 189 mmHg — ABNORMAL HIGH (ref 83.0–108.0)

## 2019-08-10 LAB — CBC WITH DIFFERENTIAL/PLATELET
Abs Immature Granulocytes: 0.31 10*3/uL — ABNORMAL HIGH (ref 0.00–0.07)
Basophils Absolute: 0 10*3/uL (ref 0.0–0.1)
Basophils Relative: 0 %
Eosinophils Absolute: 0 10*3/uL (ref 0.0–0.5)
Eosinophils Relative: 0 %
HCT: 22.9 % — ABNORMAL LOW (ref 39.0–52.0)
Hemoglobin: 7.4 g/dL — ABNORMAL LOW (ref 13.0–17.0)
Immature Granulocytes: 1 %
Lymphocytes Relative: 6 %
Lymphs Abs: 1.4 10*3/uL (ref 0.7–4.0)
MCH: 31 pg (ref 26.0–34.0)
MCHC: 32.3 g/dL (ref 30.0–36.0)
MCV: 95.8 fL (ref 80.0–100.0)
Monocytes Absolute: 1.7 10*3/uL — ABNORMAL HIGH (ref 0.1–1.0)
Monocytes Relative: 8 %
Neutro Abs: 18.6 10*3/uL — ABNORMAL HIGH (ref 1.7–7.7)
Neutrophils Relative %: 85 %
Platelets: 210 10*3/uL (ref 150–400)
RBC: 2.39 MIL/uL — ABNORMAL LOW (ref 4.22–5.81)
RDW: 13.5 % (ref 11.5–15.5)
WBC: 22 10*3/uL — ABNORMAL HIGH (ref 4.0–10.5)
nRBC: 0 % (ref 0.0–0.2)

## 2019-08-10 LAB — GLUCOSE, CAPILLARY
Glucose-Capillary: 118 mg/dL — ABNORMAL HIGH (ref 70–99)
Glucose-Capillary: 151 mg/dL — ABNORMAL HIGH (ref 70–99)
Glucose-Capillary: 118 mg/dL — ABNORMAL HIGH (ref 70–99)
Glucose-Capillary: 122 mg/dL — ABNORMAL HIGH (ref 70–99)

## 2019-08-10 LAB — MAGNESIUM
Magnesium: 2 mg/dL (ref 1.7–2.4)
Magnesium: 1.9 mg/dL (ref 1.7–2.4)

## 2019-08-10 LAB — COMPREHENSIVE METABOLIC PANEL
ALT: 27 U/L (ref 0–44)
AST: 34 U/L (ref 15–41)
Albumin: 1.6 g/dL — ABNORMAL LOW (ref 3.5–5.0)
Alkaline Phosphatase: 75 U/L (ref 38–126)
Anion gap: 8 (ref 5–15)
BUN: 37 mg/dL — ABNORMAL HIGH (ref 6–20)
CO2: 26 mmol/L (ref 22–32)
Calcium: 9 mg/dL (ref 8.9–10.3)
Chloride: 110 mmol/L (ref 98–111)
Creatinine, Ser: 0.72 mg/dL (ref 0.61–1.24)
GFR calc Af Amer: >60 mL/min (ref >60)
GFR calc non Af Amer: >60 mL/min (ref >60)
Glucose, Bld: 113 mg/dL — ABNORMAL HIGH (ref 70–99)
Potassium: 4.5 mmol/L (ref 3.5–5.1)
Sodium: 144 mmol/L (ref 135–145)
Total Bilirubin: 0.6 mg/dL (ref 0.3–1.2)
Total Protein: 6.1 g/dL — ABNORMAL LOW (ref 6.5–8.1)

## 2019-08-10 LAB — PHOSPHORUS
Phosphorus: 2.3 mg/dL — ABNORMAL LOW (ref 2.5–4.6)
Phosphorus: 2.6 mg/dL (ref 2.5–4.6)

## 2019-08-10 MED ORDER — SODIUM CHLORIDE 0.9% FLUSH: 10.0000 mL | INTRAVENOUS | Status: DC | PRN

## 2019-08-10 MED ORDER — OSMOLITE 1.2 CAL PO LIQD
1000.0000 mL | ORAL | Status: DC
  Administered 2019-08-10 – 2019-08-13 (×5): 1000 mL
  Filled 2019-08-10 (×7): qty 1000

## 2019-08-10 MED ORDER — SODIUM CHLORIDE 0.9% FLUSH
10.0000 mL | Freq: Two times a day (BID) | INTRAVENOUS | Status: DC
  Administered 2019-08-10 – 2019-08-12 (×5): 10 mL

## 2019-08-10 MED ORDER — ENOXAPARIN SODIUM 60 MG/0.6ML ~~LOC~~ SOLN
1.0000 mg/kg | Freq: Two times a day (BID) | SUBCUTANEOUS | Status: DC
  Administered 2019-08-11: 60 mg via SUBCUTANEOUS
  Filled 2019-08-10 (×2): qty 0.6

## 2019-08-10 MED ORDER — FREE WATER
200.0000 mL | Status: DC
  Administered 2019-08-10 – 2019-08-17 (×32): 200 mL

## 2019-08-10 MED ORDER — IPRATROPIUM-ALBUTEROL 20-100 MCG/ACT IN AERS
1.0000 | INHALATION_SPRAY | Freq: Two times a day (BID) | RESPIRATORY_TRACT | Status: DC
  Administered 2019-08-10 – 2019-08-11 (×2): 1 via RESPIRATORY_TRACT
  Filled 2019-08-10: qty 4

## 2019-08-10 MED ORDER — ORAL CARE MOUTH RINSE
15.0000 mL | Freq: Two times a day (BID) | OROMUCOSAL | Status: DC
  Administered 2019-08-10 – 2019-08-11 (×2): 15 mL via OROMUCOSAL

## 2019-08-10 MED ORDER — ENOXAPARIN SODIUM 30 MG/0.3ML ~~LOC~~ SOLN
30.0000 mg | Freq: Once | SUBCUTANEOUS | Status: AC
  Administered 2019-08-10: 30 mg via SUBCUTANEOUS
  Filled 2019-08-10: qty 0.3

## 2019-08-10 MED ORDER — ENOXAPARIN SODIUM 300 MG/3ML IJ SOLN: 20.0000 mg | Freq: Once | INTRAMUSCULAR | Status: DC

## 2019-08-10 NOTE — Progress Notes (Signed)
Pt became agitated tonight trying to get out of bed and pulling at wires. He pulled out his coretrack feeding tube and CCM was notified. Will readdress need for tube feeding in the AM.   I also notified CCM of RLE venous doppler results showing DVT's. CCM made adjustments to subq Lovenox dose.   Will continue to monitor.

## 2019-08-10 NOTE — Progress Notes (Signed)
Bilateral lower extremity venous duplex completed. Refer to "CV Proc" under chart review to view preliminary results.  Preliminary results discussed with Barbara Cower, RN.  08/10/2019 7:10 PM Eula Fried., MHA, RVT, RDCS, RDMS

## 2019-08-10 NOTE — Progress Notes (Signed)
PROGRESS NOTE    Charles Walters  ZOX:[id number] DOB: 1961/04/07 DOA: 07/18/2019 PCP: Marliss Coots, NP    Brief Narrative:  Patient was admitted to the hospital with a working diagnosis of community-acquired pneumonia complicated by acute hypoxic respiratory failure and severe hyponatremia.Patient with initially worsening pneumonia suspected multidrug resistant bacteria/ atypical pneumoniaor acute interstitial pneumonia  59 year old male who presented with hiccups. He does have significant past medical history for hypertension. He reported about 1 week of intractable hiccups associated with loss of appetite, and watery diarrhea. Positive dyspnea on exertion. On his initial physical examination blood pressure 156/119, heart rate 108, respiratory rate 27, oxygen saturation 98% on supplemental oxygen, his lungs had coarse breath sounds bilaterally, no wheezing, heart S1-S2 present and rhythmic, abdomen was soft and nontender, no lower extremity edema. Sodium 114, potassium 3.0, chloride 80, bicarb 17, glucose 126, BUN 7, creatinine 0.77, white count 10.9, hemoglobin 12.3, hematocrit 33.1, platelets 235. SARS COVID-19 negative. Urinary osmolarity 507, urine sodium <10, alcohol less than 10.His chest radiograph had bilateral bibasilar reticulonodular infiltrates, predominantly right base. EKG 128 bpm, normal axis, normal intervals, right atrial enlargement, sinus rhythm, poor R wave progression, LVH, no ST segment T wave changes.  Patient received hypertonic saline and intravenous antibiotics, further work-up with CT chest negative for pulmonary embolism, echocardiography with preserved LV and RV systolic function.  His discharge was cancelled due to worsening hypoxemic respiratory failure, along with worsening pulmonary radiographic changes,bilateral infiltrates at bases, more dense on the leftbase, . Resumed antibiotic therapy with vancomycin and cefepime, suspected multidrug resistant  bacterial pneumonia/ atypical pneumonia or acute interstitial process.  Patient with rapid worsening respiratory failure despite broad spectrum antibiotic therapy, follow CT chest with worsening infiltrates, suspected acute interstitial pneumonia/ pneumocystis.   03/02 Transferred to ICU for heated high flow nasal cannula therapy.      Assessment & Plan:   Principal Problem:   Acute respiratory failure with hypoxia (HCC) Active Problems:   Hyponatremia   Intractable hiccups   Diarrhea   Asthenia   Essential hypertension   Community acquired bacterial pneumonia   Acute interstitial pneumonia (HCC)   Leukocytosis   Sinus tachycardia   Tobacco abuse   Severe protein-calorie malnutrition (HCC)  1 acute hypoxemic respiratory failure Initially felt to be secondary to community-acquired pneumonia (atypical/viral) bilaterally.??  Acute interstitial pneumonitis versus ARDS. Patient with worsening hypoxic respiratory status/failure currently on high flow nasal cannula at 25 L/min with 70% FiO2 with sats of 100% and tachypneic with a respiratory rate of 35.  Patient with use of accessory muscles of respiration.  BG this morning with a pH of 7.4/PCO2 of 42/PO2 of 189/bicarb of 27.  ABG done on 08/18/2019 with a pH of 7.43/PCO2 of 32/PO2 of 69/bicarb of 24.  Repeat CT chest done on 08/06/2019 with extensive worsening crazy paving pattern (groundglass opacity plus interlobular septal thickening) throughout the peripheral lungs bilaterally involving all lung lobes.  New patchy regions of consolidative airspace disease in the lungs bilaterally most prominent in the lower lobes.  Given interval worsening considerations include acute interstitial pneumonia/ARDS, atypical infection of pulmonary alveolar proteinosis.  Small dependent bilateral pleural effusions.  Dilated pulmonary arteries suggesting pulmonary arterial hypertension.  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  Status post 2-week course  of empiric IV vancomycin, Levaquin, cefepime.  Patient received a dose of IV azithromycin and a dose of IV Bactrim.  Still with a leukocytosis of 25.  Repeat chest x-ray with persistent pulmonary infiltrates consistent with multifocal  pneumonia. Respiratory viral panel negative.  MRSA PCR negative.  SARS coronavirus 2 PCR negative.  Patient seen in consultation by ID and who had recommended finishing 14-day course of antibiotics which was completed 08/18/2019.  Urine Legionella antigen and strep pneumococcus antigen pending.  ID recommending bronchoscopy if patient was to get intubated due to worsening respiratory failure.  Pulmonary consulted as well and are following and were in agreement with discontinuation of antibiotics on 09/01/2019.  Patient started on IV steroids (08/08/2019) which we will continue.  Patient with worsening respiratory status will likely get fatigued and likely be intubated.  Continue flutter valve.  Incentive spirometry.  Patient with worsening respiratory status may need intubation however will defer to PCCM.  ID recommended that if patient were to be on prednisone 20 mg daily > 30 days then will place on Bactrim DS daily for OI prophylaxis however at this current moment recommending no antibiotics.  ID signed off on 08/09/2019.  Pulmonary following and appreciate input and recommendations.  2.  Severe protein calorie malnutrition When tolerating oral intake will need nutritional supplementation.  3.  Leukocytosis with bandemia Patient noted to have a bandemia.  Patient was pancultured with negative blood cultures from 08/02/2019.  MRSA PCR negative.  Respiratory viral panel negative.  SARS coronavirus 2 PCR negative.  Status post 14 days of broad-spectrum antibiotics which included vancomycin, cefepime, Bactrim, azithromycin.  Leukocytosis currently at 22.  ID following recommended no further antibiotics however did state if on steroids of prednisone 20 mg for greater than 30 days will need  daily Bactrim for prophylaxis.  ID signed off as of 08/09/2019..  Follow.  4.  Severe hypovolemic hypoosmolar hyponatremia Resolved.  Felt secondary to hypovolemia in the setting of ARB and HCTZ.  Currently resolved.  Sodium at 144.  Follow renal function, electrolytes.  Avoid hypotension.  Follow.  Nephrology was following and have signed off.  5.  Hypertension Blood pressure borderline.  Continue to hold antihypertensive medications.  Continue IV fluids.   6.  Sinus tachycardia Likely secondary to problem #1.  Slight improvement with tachycardia.  IV Lopressor as needed.   7.  Tobacco abuse/alcohol use Per Dr. Cathlean Sauer patient drinks alcohol at home daily 1 beer daily and smokes 3 to 4 cigarettes daily.  Nicotine patch.  Multivitamin, thiamine.  Patient with no signs of withdrawal.  Follow.    8.  Acute diarrhea Resolved.   DVT prophylaxis: Lovenox Code Status: Full Family Communication: Updated patient. Updated daughter Kailash Hinze via telephone. Disposition Plan:  . Patient came from: Home            . Anticipated d/c place: To be determined.  Home versus SNF . Barriers to d/c OR conditions which need to be met to effect a safe d/c: To be determined.  Home versus SNF when clinically improved with no further O2 requirements and cleared by pulmonary.   Consultants:   Nephrology: Dr. Marval Regal 07/28/2019  Pulmonary and critical care medicine: Dr. Loanne Drilling 07/30/2019  Pulmonary critical care medicine: Dr. Shearon Stalls 09/04/2019  Infectious disease: Dr. Baxter Flattery 08/08/2019  Procedures:   CT chest 07/19/2019, 08/06/2019  CT angiogram chest 07/26/2019  2D echo 07/26/2019    Antimicrobials:   IV Levaquin 07/28/2019>>>>> 08/02/2019  IV azithromycin 03/04/2021x1 dose  IV cefepime 08/02/2019>>>> 08/26/2019  IV Bactrim 08/07/2018 21x1 dose  IV vancomycin 07/30/2019>>>>> 08/19/2019   Subjective: Patient in bed on 25 L heated high flow O2 with FiO2 of 70% with sats of 100%.  Patient with  use of  accessory muscles of respiration.  Patient feels no significant improvement over the past 24 hours.  Weak.  Per RN patient eating about 30% of breakfast this morning.  Objective: Vitals:   08/10/19 0600 08/10/19 0745 08/10/19 0800 08/10/19 0802  BP: (!) 99/58 117/73 103/79   Pulse: 92 (!) 15 (!) 113   Resp: (!) 31 (!) 31 (!) 23   Temp:    98.6 F (37 C)  TempSrc:    Oral  SpO2: 96% 93%    Weight:      Height:        Intake/Output Summary (Last 24 hours) at 08/10/2019 0949 Last data filed at 08/10/2019 0600 Gross per 24 hour  Intake 1369.16 ml  Output 1655 ml  Net -285.84 ml   Filed Weights   07/26/19 0000 08/26/2019 0459 08/09/19 0415  Weight: 64.4 kg 59.8 kg 59.7 kg    Examination:  General exam: On 25 L of heated high flow nasal cannula O2 with FiO2 of 70% with sats of 100%.  Use of accessory muscles of respiration.  Increased respiratory rate.  Frail.  Cachectic.  Temporal wasting Respiratory system: Some decreased breath sounds in the bases.  No wheezing.  Poor air movement.  Tachypneic.  Use of accessory muscles of respiration.  Cardiovascular system: Tachycardia.  No murmurs rubs or gallops.  No JVD.  No lower extremity edema.   Gastrointestinal system: Abdomen is nontender, nondistended, soft, positive bowel sounds.  No rebound.  No guarding.  Central nervous system: Alert and following commands..  Moving extremities spontaneously. Extremities: Symmetric 5 x 5 power. Skin: No rashes, lesions or ulcers Psychiatry: Judgement and insight appear fair. Mood & affect appropriate.     Data Reviewed: I have personally reviewed following labs and imaging studies  CBC: Recent Labs  Lab 08/06/19 0123 08/06/19 0123 08/29/2019 0131 08/06/2019 0131 08/08/19 0248 08/09/19 0158 08/09/19 1108 08/10/19 0419 08/10/19 0556  WBC 16.5*  --  23.7*  --  22.1* 25.5*  --   --  22.0*  NEUTROABS 13.0*  --  19.9*  --  18.4* 22.3*  --   --  18.6*  HGB 7.5*   < > 8.6*   < > 7.8* 8.2* 8.8* 7.5*  7.4*  HCT 21.6*   < > 24.6*   < > 22.5* 24.3* 26.0* 22.0* 22.9*  MCV 89.3  --  90.4  --  90.4 90.7  --   --  95.8  PLT 272  --  282  --  241 234  --   --  210   < > = values in this interval not displayed.   Basic Metabolic Panel: Recent Labs  Lab 08/06/19 0123 08/06/19 0123 08/08/2019 0131 08/21/2019 0131 08/08/19 0248 08/09/19 0158 08/09/19 1108 08/10/19 0419 08/10/19 0556  NA 135   < > 135   < > 135 144 144 144 144  K 4.3   < > 4.2   < > 3.6 4.4 4.1 4.0 4.5  CL 101  --  102  --  101 108  --   --  110  CO2 25  --  22  --  23 23  --   --  26  GLUCOSE 100*  --  98  --  101* 128*  --   --  113*  BUN 10  --  11  --  20 35*  --   --  37*  CREATININE 0.66  --  0.67  --  0.88 0.82  --   --  0.72  CALCIUM 8.2*  --  8.3*  --  8.3* 8.8*  --   --  9.0  MG  --   --   --   --   --  2.3  --   --   --   PHOS  --   --   --   --   --  3.5  --   --   --    < > = values in this interval not displayed.   GFR: Estimated Creatinine Clearance: 86 mL/min (by C-G formula based on SCr of 0.72 mg/dL). Liver Function Tests: Recent Labs  Lab 08/10/19 0556  AST 34  ALT 27  ALKPHOS 75  BILITOT 0.6  PROT 6.1*  ALBUMIN 1.6*   No results for input(s): LIPASE, AMYLASE in the last 168 hours. No results for input(s): AMMONIA in the last 168 hours. Coagulation Profile: No results for input(s): INR, PROTIME in the last 168 hours. Cardiac Enzymes: No results for input(s): CKTOTAL, CKMB, CKMBINDEX, TROPONINI in the last 168 hours. BNP (last 3 results) No results for input(s): PROBNP in the last 8760 hours. HbA1C: Recent Labs    08/09/19 1018  HGBA1C 6.0*   CBG: Recent Labs  Lab 08/09/19 1246 08/09/19 1553 08/09/19 2206 08/10/19 0811  GLUCAP 108* 119* 142* 118*   Lipid Profile: No results for input(s): CHOL, HDL, LDLCALC, TRIG, CHOLHDL, LDLDIRECT in the last 72 hours. Thyroid Function Tests: No results for input(s): TSH, T4TOTAL, FREET4, T3FREE, THYROIDAB in the last 72 hours. Anemia  Panel: No results for input(s): VITAMINB12, FOLATE, FERRITIN, TIBC, IRON, RETICCTPCT in the last 72 hours. Sepsis Labs: Recent Labs  Lab 08/08/19 0248  PROCALCITON 3.64    Recent Results (from the past 240 hour(s))  Culture, blood (routine x 2)     Status: None   Collection Time: 08/02/19  5:50 PM   Specimen: BLOOD  Result Value Ref Range Status   Specimen Description BLOOD LEFT ANTECUBITAL  Final   Special Requests   Final    BOTTLES DRAWN AEROBIC AND ANAEROBIC Blood Culture adequate volume   Culture   Final    NO GROWTH 5 DAYS Performed at [facility], [address] [place], [place] [address]    Report Status 08/21/2019 FINAL  Final  Culture, blood (routine x 2)     Status: None   Collection Time: 08/02/19  6:00 PM   Specimen: BLOOD RIGHT HAND  Result Value Ref Range Status   Specimen Description BLOOD RIGHT HAND  Final   Special Requests   Final    BOTTLES DRAWN AEROBIC AND ANAEROBIC Blood Culture adequate volume   Culture   Final    NO GROWTH 5 DAYS Performed at [facility], [place] [address] [place], [place] [address]    Report Status 08/26/2019 FINAL  Final  MRSA PCR Screening     Status: Abnormal   Collection Time: 08/03/19  6:30 PM   Specimen: Nasal Mucosa; Nasopharyngeal  Result Value Ref Range Status   MRSA by PCR (A) NEGATIVE Final    INVALID, UNABLE TO DETERMINE THE PRESENCE OF TARGET DUE TO SPECIMEN INTEGRITY. RECOLLECTION REQUESTED.    CommentConcha Pyo RN [id number] 08/03/19 A BROWNING Performed at [facility], [place] [address], [place], [place] [address]   MRSA PCR Screening     Status: None   Collection Time: 08/19/2019  5:01 AM   Specimen: Nasal Mucosa;  Nasopharyngeal  Result Value Ref Range Status   MRSA by PCR NEGATIVE NEGATIVE Final    Comment:        The GeneXpert MRSA Assay (FDA approved for NASAL specimens only), is one component of a comprehensive MRSA colonization surveillance program. It is not intended to diagnose  MRSA infection nor to guide or monitor treatment for MRSA infections. Performed at [facility], [place] [address], [place], [place] [address]   Culture, blood (Routine X 2) w Reflex to ID Panel     Status: None (Preliminary result)   Collection Time: 08/08/19  2:08 PM   Specimen: BLOOD  Result Value Ref Range Status   Specimen Description BLOOD RIGHT ANTECUBITAL  Final   Special Requests   Final    BOTTLES DRAWN AEROBIC ONLY Blood Culture results may not be optimal due to an inadequate volume of blood received in culture bottles   Culture   Final    NO GROWTH < 24 HOURS Performed at [facility], [place] [address], [place], [place] [address]    Report Status PENDING  Incomplete  Culture, blood (Routine X 2) w Reflex to ID Panel     Status: None (Preliminary result)   Collection Time: 08/08/19  2:09 PM   Specimen: BLOOD LEFT WRIST  Result Value Ref Range Status   Specimen Description BLOOD LEFT WRIST  Final   Special Requests   Final    BOTTLES DRAWN AEROBIC ONLY Blood Culture results may not be optimal due to an inadequate volume of blood received in culture bottles   Culture   Final    NO GROWTH < 24 HOURS Performed at [facility], [place] [address], [place], [place] [address]    Report Status PENDING  Incomplete         Radiology Studies: DG Chest Port 1 View  Result Date: 08/09/2019 CLINICAL DATA:  Shortness of breath, respiratory failure, smoker EXAM: PORTABLE CHEST 1 VIEW COMPARISON:  Portable exam 5102 hours compared to 08/08/2019 FINDINGS: Normal heart size mediastinal contours. Hazy infiltrates again identified throughout both lungs, predominantly mid lungs, slightly greater on RIGHT than LEFT, consistent with pneumonia. No pleural effusion or pneumothorax. Bones unremarkable. IMPRESSION: Persistent pulmonary infiltrates consistent with multifocal pneumonia. Electronically Signed   By: Lavonia Dana M.D.   On: 08/09/2019 09:12        Scheduled Meds: .  chlorhexidine  15 mL Mouth Rinse BID  . Chlorhexidine Gluconate Cloth  6 each Topical Daily  . enoxaparin (LOVENOX) injection  40 mg Subcutaneous QHS  . feeding supplement (ENSURE ENLIVE)  237 mL Oral TID BM  . insulin aspart  0-6 Units Subcutaneous TID WC  . Ipratropium-Albuterol  1 puff Inhalation Q6H  . mouth rinse  15 mL Mouth Rinse BID  . mouth rinse  15 mL Mouth Rinse q12n4p  . methylPREDNISolone (SOLU-MEDROL) injection  60 mg Intravenous Q24H  . multivitamin with minerals  1 tablet Oral Daily  . nicotine  7 mg Transdermal Daily  . pantoprazole (PROTONIX) IV  40 mg Intravenous Q24H  . sodium chloride flush  10-40 mL Intracatheter Q12H  . sodium chloride flush  3 mL Intravenous Once  . thiamine injection  100 mg Intravenous Daily   Continuous Infusions: . sodium chloride 125 mL/hr at 08/10/19 0813     LOS: 16 days    Time spent: 40 minutes    Irine Seal, MD [facility]   To contact the attending provider between 7A-7P  or the covering provider during after hours 7P-7A, please log into the web site [email or web address] and access using universal [place] password for that web site. If you do not have the password, please call the hospital operator.  08/10/2019, 9:49 AM

## 2019-08-10 NOTE — Progress Notes (Signed)
eLink Physician-Brief Progress Note Patient Name: Charles Walters DOB: 1961-04-06 MRN: [id number]   Date of Service  08/10/2019  HPI/Events of Note  Notified that patient had pulled out his Cortrak. Bedside RN reports agitation tends to occur at night. He is able to eat per [place] but insufficient amount hence Cortrak insertion.  DVT screen positive for acute DVT  eICU Interventions  Ok to hold off re-inserting feeding tube for tonight to avoid having to give chemical restraints. Already has mittens on  Enoxaparin increased to 1mg /kg BID     Intervention Category Intermediate Interventions: Diagnostic test evaluation Minor Interventions: Agitation / anxiety - evaluation and management;Other:  08/10/2019, 10:55 PM

## 2019-08-10 NOTE — Progress Notes (Signed)
Occupational Therapy Treatment Patient Details Name: Charles Walters MRN: [id number] DOB: 1961/01/04 Today's Date: 08/10/2019    History of present illness This 59 y.o. male admitted with intractable hiccups, loss of appetite, watery diarrhea, and DOE.  Pt with respiratory failure  with infiltratres and hypoxemia resulting in hi flow O2 and tranfser to [place] [facility]. Dx: acute respiratory failure with hypoxia, multifocal PNA, hyponatremia.  PMH includes: HTN. Pt with worsening pulmonary status, requiring 100% FiO2 high flow oxygen at 40L and transfer to ICU.   OT comments  Pt making slow progress toward OT goals, needing less O2 than re-eval. Pt on 25L O2 and 70% FiO2 throughout session. Pt needing brief period of 30L at end of session, but able to taper back down to 25L. Pt sat EOB with min A to don socks at min A level. Increased rest breaks, pacing, and ECS reinforcement needed. Pt pulling at lines and impulsively laying back into bed. With multimodal cueing and increased time, pt able to process task. Transfer to chair then completed with max A +2 for assist and safety. Pt is impulsive and unaware of safety deficits. Max cueing needed to slow down breathing. RR up to mid 40s and O2 down to 72% after transfer. This was when pt required 30L to recover. Updated recommendations to SNF to reflect current level of progress. Will continue to follow.    Follow Up Recommendations  SNF;Supervision/Assistance - 24 hour    Equipment Recommendations  Tub/shower seat;3 in 1 bedside commode    Recommendations for Other Services      Precautions / Restrictions Precautions Precautions: Fall;Other (comment) Precaution Comments: O2  Restrictions Weight Bearing Restrictions: No       Mobility Bed Mobility Overal bed mobility: Needs Assistance Bed Mobility: Supine to Sit;Sit to Supine     Supine to sit: Min assist Sit to supine: Min guard   General bed mobility comments: Assist for trunk elevation and BLE  management.   Transfers Overall transfer level: Needs assistance Equipment used: 2 person hand held assist Transfers: Sit to/from [employer] Sit to Stand: Mod assist;+2 physical assistance   Squat pivot transfers: Max assist;+2 physical assistance;+2 safety/equipment     General transfer comment: fatigues easily, can be impulsive with transfers    Balance Overall balance assessment: Needs assistance Sitting-balance support: No upper extremity supported;Feet supported Sitting balance-Leahy Scale: Fair Sitting balance - Comments: minG at edge of bed while donning socks   Standing balance support: Bilateral upper extremity supported Standing balance-Leahy Scale: Zero Standing balance comment: maxA x2                           ADL either performed or assessed with clinical judgement   ADL Overall ADL's : Needs assistance/impaired                     Lower Body Dressing: Minimal assistance;Sitting/lateral leans Lower Body Dressing Details (indicate cue type and reason): sitting EOB to don socks, needing min A to guide socks and to cue for breathing and pacing. Desatting with this activity Toilet Transfer: Maximal assistance;+2 for physical assistance;+2 for safety/equipment;Squat-pivot Toilet Transfer Details (indicate cue type and reason): for safety, pt can be impulsive           General ADL Comments: pt progressing in cardiopulmonary status from re-eval, but continues to require increased assist     Vision   Vision Assessment?: No apparent visual deficits   Perception  Praxis      Cognition Arousal/Alertness: Awake/alert Behavior During Therapy: Impulsive Overall Cognitive Status: Impaired/Different from baseline Area of Impairment: Memory;Following commands;Safety/judgement;Awareness                     Memory: Decreased recall of precautions;Decreased short-term memory Following Commands: Follows one step commands  consistently;Follows multi-step commands with increased time Safety/Judgement: Decreased awareness of safety;Decreased awareness of deficits   Problem Solving: Slow processing General Comments: Pt has decreased awareness of deficits and increased time for processing in order to follow commands.         Exercises     Shoulder Instructions       General Comments 70% FiO2 25L at rest, desats to 74% during transfer, PT increases oxygen to 30L with patient recovering within one minute of seated rest, weaned back to 25L    Pertinent Vitals/ Pain       Pain Assessment: No/denies pain  Home Living                                          Prior Functioning/Environment              Frequency  Min 2X/week        Progress Toward Goals  OT Goals(current goals can now be found in the care plan section)     Acute Rehab OT Goals Patient Stated Goal: get my strength back  OT Goal Formulation: With patient Time For Goal Achievement: 08/22/19 Potential to Achieve Goals: Good  Plan      Co-evaluation    PT/OT/SLP Co-Evaluation/Treatment: Yes Reason for Co-Treatment: For patient/therapist safety;To address functional/ADL transfers;Complexity of the patient's impairments (multi-system involvement)   OT goals addressed during session: ADL's and self-care;Strengthening/ROM      AM-PAC OT "6 Clicks" Daily Activity     Outcome Measure   Help from another person eating meals?: A Lot Help from another person taking care of personal grooming?: A Lot Help from another person toileting, which includes using toliet, bedpan, or urinal?: A Lot Help from another person bathing (including washing, rinsing, drying)?: A Lot Help from another person to put on and taking off regular upper body clothing?: A Lot Help from another person to put on and taking off regular lower body clothing?: A Lot 6 Click Score: 12    End of Session Equipment Utilized During Treatment:  Oxygen;Gait belt  OT Visit Diagnosis: Unsteadiness on feet (R26.81);Other abnormalities of gait and mobility (R26.89)   Activity Tolerance Patient limited by fatigue   Patient Left in chair;with call bell/phone within reach;with chair alarm set;with nursing/sitter in room   Nurse Communication Mobility status        Time: 1017-5102 OT Time Calculation (min): 27 min  Charges: OT General Charges $OT Visit: 1 Visit OT Treatments $Self Care/Home Management : 8-22 mins  Dalphine Handing, MSOT, OTR/L Acute Rehabilitation Services [facility] Office Number: [phone] Pager: [phone]  Dalphine Handing 08/10/2019, 1:32 PM

## 2019-08-10 NOTE — Evaluation (Signed)
Clinical/Bedside Swallow Evaluation Patient Details  Name: Charles Walters MRN: [id number] Date of Birth: Aug 24, 1960  Today's Date: 08/10/2019 Time: SLP Start Time (ACUTE ONLY): 1250 SLP Stop Time (ACUTE ONLY): 1306 SLP Time Calculation (min) (ACUTE ONLY): 16 min  Past Medical History:  Past Medical History:  Diagnosis Date  . Hypertension    Past Surgical History: History reviewed. No pertinent surgical history. HPI:  Pt is a 59 y.o. male with past medical history significant for HTN who presented to the ED for treatment of hiccups. Patient states that he was in his usual state of reasonable health until 1 week prior to admission when he developed intractable hiccups as well as loss of appetite, watery diarrhea and some dyspnea on exertion. Chest x-ray of 3/4: Persistent pulmonary infiltrates consistent with multifocal pneumonia.Pt reported "a little bit" of coughing/choking when he eats.   Assessment / Plan / Recommendation Clinical Impression  Pt was seen for bedside swallow evaluation. He reported that he frequently coughs/chokes at baseline and that he has been more symptomatic since admission. He denied significant variability with consistency or any alleviating factors. He was on 25L HHFNC with 70% FiO2 during the evaluation. Oral mechanism exam was [facility]. He tolerated all solids and liquids without signs or symptoms of aspiration. Mastication time was increased with regular texture solids and he reported that the bolus was "too dry". Considering the pt's reports and concerns regarding aspiration, it is recommended that a modified barium swallow study be conducted to further assess the functional integrity of the swallow mechanism and it is scheduled for today at 14:15.  SLP Visit Diagnosis: Dysphagia, unspecified (R13.10)    Aspiration Risk  Mild aspiration risk    Diet Recommendation Dysphagia 3 (Mech soft);Thin liquid   Liquid Administration via: Cup;Straw Medication Administration:  Whole meds with puree Supervision: Patient able to self feed;Intermittent supervision to cue for compensatory strategies Compensations: Slow rate;Small sips/bites Postural Changes: Seated upright at 90 degrees;Remain upright for at least 30 minutes after po intake    Other  Recommendations Oral Care Recommendations: Oral care BID   Follow up Recommendations Other (comment)(TBD)      Frequency and Duration min 2x/week  2 weeks       Prognosis Prognosis for Safe Diet Advancement: Good      Swallow Study   General Date of Onset: 08/09/19 HPI: Pt is a 59 y.o. male with past medical history significant for HTN who presented to the ED for treatment of hiccups. Patient states that he was in his usual state of reasonable health until 1 week prior to admission when he developed intractable hiccups as well as loss of appetite, watery diarrhea and some dyspnea on exertion. Chest x-ray of 3/4: Persistent pulmonary infiltrates consistent with multifocal pneumonia.Pt reported "a little bit" of coughing/choking when he eats. Type of Study: Bedside Swallow Evaluation Previous Swallow Assessment: None Diet Prior to this Study: Dysphagia 3 (soft);Thin liquids Temperature Spikes Noted: No Respiratory Status: Nasal cannula(HHFNC) History of Recent Intubation: No Behavior/Cognition: Alert;Cooperative;Pleasant mood Oral Cavity Assessment: Within Functional Limits Oral Care Completed by SLP: No Vision: Functional for self-feeding Self-Feeding Abilities: Able to feed self Patient Positioning: Upright in bed Baseline Vocal Quality: Normal    Oral/Motor/Sensory Function Overall Oral Motor/Sensory Function: Within functional limits   Ice Chips Ice chips: Within functional limits   Thin Liquid Thin Liquid: Within functional limits Presentation: Cup;Straw    Nectar Thick Nectar Thick Liquid: Not tested   Honey Thick Honey Thick Liquid: Not tested  Puree Puree: Within functional limits Presentation:  Spoon   Solid     Solid: Impaired Presentation: Self Fed Oral Phase Impairments: Impaired mastication     Oreatha Fabry I. Vear Clock, MS, CCC-SLP Acute Rehabilitation Services Office number [phone] Pager [phone]  Scheryl Marten 08/10/2019,1:47 PM

## 2019-08-10 NOTE — Progress Notes (Signed)
Modified Barium Swallow Progress Note  Patient Details  Name: Charles Walters MRN: [id number] Date of Birth: [birth year]  Today's Date: 08/10/2019  Modified Barium Swallow completed.  Full report located under Chart Review in the Imaging Section.  Brief recommendations include the following:  Clinical Impression   Pt was seen in radiology suite for modified barium swallow study. Trials of puree solids, dysphagia 3 solids, thin liquids,  nectar thick liquids, and a 29mm barium tablet were administered. Pt presents with mild-moderate oropharyngeal dysphagia characterized by impaired bolus control a pharyngeal delay, reduced lingual retraction, and reduced anterior laryngeal movement. He demonstrated premature spillage to the valleculae and pyriform sinuses, mild vallecular residue, mild-moderate pyriform sinus residue, and inconsistent silent aspiration (PAS 8) of thin liquids from residue in the pyriform sinuses. No instances of penetration/aspiration were noted with any other solids or liquids. It is noteworthy that his swallow function is likely negatively impacted by his respiratory function. Pt reported dyspnea during the study and that mastication of solids was effortful. Breaks were provided and pt reported improved respiratory function. A dysphagia 2 diet with nectar thick liquids is recommended at this time. SLP will follow pt to assess diet tolerance and for treatment.    Swallow Evaluation Recommendations       SLP Diet Recommendations: Nectar thick liquid;Dysphagia 2 (Fine chop) solids   Liquid Administration via: Cup;No straw   Medication Administration: Whole meds with puree   Supervision: Patient able to self feed   Compensations: Slow rate;Small sips/bites   Postural Changes: Seated upright at 90 degrees   Oral Care Recommendations: Oral care BID   Other Recommendations: Order thickener from pharmacy   [place] I. Vear Clock, MS, CCC-SLP Acute Rehabilitation Services Office  number [phone] Pager [phone]  Scheryl Marten 08/10/2019,4:34 PM

## 2019-08-10 NOTE — Procedures (Signed)
Cortrak  Person Inserting Tube:  Renie Ora, RD Tube Type:  Cortrak - 43 inches Tube Location:  Left nare Initial Placement:  Stomach Secured by: Bridle Technique Used to Measure Tube Placement:  Documented cm marking at nare/ corner of mouth Cortrak Secured At:  63 cm Procedure Comments:  Cortrak Tube Team Note:  Consult received to place a Cortrak feeding tube.   No x-ray is required. RN may begin using tube.   If the tube becomes dislodged please keep the tube and contact the Cortrak team at [email or web address] ([email or web address]) for replacement.  If after hours and replacement cannot be delayed, place a NG tube and confirm placement with an abdominal x-ray.     Trenton Gammon, MS, RD, LDN, CNSC Inpatient Clinical Dietitian RD pager # available in AMION  After hours/weekend pager # available in [facility]

## 2019-08-10 NOTE — Progress Notes (Signed)
Brief Nutrition Follow-up:  Plan for Cortrak tube placement today with initiation of TF  Pt remains on HFNC  Per documentation, pt ate 25% at dinner last night, 30% at breakfast this AM. Previously eating 0%  Interventions:   Begin 24 hours continuous feedings given concern for re-feeding. If tolerating, can transition to nocturnal TF to promote po intake.   Tube Feeding:  Osmolite 1.2 at 20 ml/hr, goal rate of 70 ml/hr Initiate TF at rate of 20 ml/hr; titrate by 10 mL q 8 hours until goal rate of 70 ml/hr Provides 2016 kcals, 93 g of protein and 1361 mL of free water  Monitor magnesium, potassium, and phosphorus for at least 5 occurrences, MD to replete as needed, as pt is at risk for refeeding syndrome given chronic severe malnutrition, prolonged poor po intake  Romelle Starcher MS, RDN, LDN, CNSC RD Pager Number and Weekend/On-Call After Hours Pager Located in [place]

## 2019-08-10 NOTE — Progress Notes (Signed)
PULMONARY / CRITICAL CARE MEDICINE   NAME:  Charles Walters, MRN:  [id number], DOB:  [birth year], LOS: 40 ADMISSION DATE:  07/28/2019, CONSULTATION DATE:  07/30/2019 REFERRING MD:  Grandville Silos, CHIEF COMPLAINT:  Hypoxemic respiratory failure  BRIEF HISTORY:    The patient is a 59 year old gentleman who presented 07/29/2019 with hiccups, loss of appetite, hyponatremia, watery diarrhea.  He was also short of breath on exertion.  He had a grossly abnormal chest x-ray and was found to be incidentally hypoxemic.  He was seen initially by pulmonary critical care team and treated empirically for community-acquired pneumonia.  However over the last week he has continued to decline, had worsening hypoxemia, and had a repeat CT chest which shows persistent bilateral lower lobe predominant groundglass opacities with interlobular septal thickening.  Is being called back to evaluate these opacities in the setting of hypoxemic respiratory failure.  SIGNIFICANT PAST MEDICAL HISTORY   Hypertension  SIGNIFICANT EVENTS:  2/22: PCCM consulted and recommended for CAP coverage 3/1: PCCM consulted for persistent infiltrates and hypoxemia, planned for bronch AM of 3/2 for which transferred to ICU; bronch cancelled for worsening O2 requirements   STUDIES:   2/17: CT Chest wo Contrast: multifocal pneumonia, viral vs atypical; mild aneurysmal dilation of ascending aorta up to 4.2cm  2/18: CT Angio Chest: No PE; nonspecific multifocal pneumonia of atypical etiology 3/1: CT Chest wo Contrast: extensive worsening crazy paving pattern (ground glass opacity + interlobular septal thickening) throughout peripheral lungs bilaterally involving all lung lobes; new patchy regions of consolidative airspace disease in lungs bilaterally; most prominent in lower lobes. Given interval worsening, considerations include acute interstitial pneumonia/ARDS, atypical infection or pulmonary alveolar proteinosis; small dependent bilateral pleural effusions;  dilated main pulmonary artery suggesting pulmonary arterial hypertension; stable ascending thoracic aorta aneurysm.   CULTURES:  Cultures no growth to date Respiratory virus panel negative MRSA PCR negative Covid negative 3/3: Blood cultures x2> neg to date 3/3: Urine legionella>  3/3: Urine strep>   ANTIBIOTICS:  Vancomycin 2/22> 3/2 Cefepime 2/25> 3/2 Bactrim 3/2 Azithromycin 3/2  CONSULTANTS:  PCCM  SUBJECTIVE:  This morning, patient is awake and alert. He notes that he is "doing okay" and denies any pain.   CONSTITUTIONAL: BP 117/73   Pulse (!) 15   Temp 98.6 F (37 C) (Oral)   Resp (!) 31   Ht 6\' 1"  (1.854 m)   Wt 59.7 kg   SpO2 93%   BMI 17.36 kg/m   I/O last 3 completed shifts: In: 339.2 [P.O.:240; I.V.:99.2] Out: 2490 [Urine:2490]  FiO2 (%):  [60 %-80 %] 70 %  CBC Latest Ref Rng & Units 08/10/2019 08/10/2019 08/09/2019  WBC 4.0 - 10.5 K/uL 22.0(H) - -  Hemoglobin 13.0 - 17.0 g/dL 7.4(L) 7.5(L) 8.8(L)  Hematocrit 39.0 - 52.0 % 22.9(L) 22.0(L) 26.0(L)  Platelets 150 - 400 K/uL 210 - -   CMP Latest Ref Rng & Units 08/10/2019 08/10/2019 08/09/2019  Glucose 70 - 99 mg/dL 113(H) - -  BUN 6 - 20 mg/dL 37(H) - -  Creatinine 0.61 - 1.24 mg/dL 0.72 - -  Sodium 135 - 145 mmol/L 144 144 144  Potassium 3.5 - 5.1 mmol/L 4.5 4.0 4.1  Chloride 98 - 111 mmol/L 110 - -  CO2 22 - 32 mmol/L 26 - -  Calcium 8.9 - 10.3 mg/dL 9.0 - -  Total Protein 6.5 - 8.1 g/dL 6.1(L) - -  Total Bilirubin 0.3 - 1.2 mg/dL 0.6 - -  Alkaline Phos 38 - 126 U/L 75 - -  AST 15 - 41 U/L 34 - -  ALT 0 - 44 U/L 27 - -   PHYSICAL EXAM: General:  Ill appearing elderly male  Neuro:  Awake, alert and oriented x3, no focal deficits noted HEENT:  [place]/AT; EOMI conjunctiva wnl Cardiovascular:  Sinus tachycardia, S1 and S2 present  Lungs:  Diffuse crackles with coarse breath sounds at bilateral bases; poor air movement; tachypnea improved Abdomen:  Soft, nontender, nondistended, normoactive bowel  sounds Musculoskeletal:  Nontender, nonedematous  Skin:  Warm, dry, nondiaphoretic  RESOLVED PROBLEM LIST   ASSESSMENT AND PLAN   Acute hypoxemic respiratory failure 2/2 atypical/viral CAP vs acute interstitial pneumonitis vs ARDS Patient presented with dyspnea on exertion and found to be hypoxemic with grossly abnormal CXR. Initially treated for CAP; however, worsening hypoxemia and repeat CT Chest w/persistent bilateral lower lobe predominant ground glass opacities w/interlobular septal thickening. He has completed 2 week course of empiric antibiotics on 3/2. RVP, MRSA, COVID negative. Urine legionella and strep pending. Patient on IV steroids for possible acute interstitial pneumonitis vs ARDS. He is currently on 25L HFNC w/70% FiO2 and maintaining >88% saturation. Tachypnea is improved.  - IV solumedrol 31mh q24h day 3 - Patient needs bronchoscopy; however, too unstable for this unless intubated - Continue to monitor  Acute metabolic encephalopathy likely 2/2 delirium and respiratory failure Currently alert and oriented x3.  - Continue to monitor   Severe protein calorie malnutrition BMI 17. Chronically ill appearing male with temporal wasting. Minimal PO intake. - Cortrak placement w/initiation of tube feeds  Leukocytosis w/bandemia: Patient with persistent low grade fevers. He has completed two week course of empiric antibiotics and has received 1 dose of bactrim and azithromycin. Leukocytosis stable this AM.   Best Practice / Goals of Care / Disposition.   Diet: regular diet; will need tube feeds given inadequate PO intake Pain/Anxiety/Delirium protocol (if indicated): n/a VAP protocol (if indicated):n/a DVT prophylaxis: lovenox GI prophylaxis: Protonix Glucose control: monitor CBG Foley condom cath Mobility: bed rest Code Status: Full Family Communication: Patient's daughter in [place] updated via phone 3/3; attempted to update family 3/5, unable to get in touch Disposition:  ICU  LABS  Glucose Recent Labs  Lab 08/09/19 1246 08/09/19 1553 08/09/19 2206  GLUCAP 108* 119* 142*    BMET Recent Labs  Lab 08/08/19 0248 08/08/19 0248 08/09/19 0158 08/09/19 0158 08/09/19 1108 08/10/19 0419 08/10/19 0556  NA 135   < > 144   < > 144 144 144  K 3.6   < > 4.4   < > 4.1 4.0 4.5  CL 101  --  108  --   --   --  110  CO2 23  --  23  --   --   --  26  BUN 20  --  35*  --   --   --  37*  CREATININE 0.88  --  0.82  --   --   --  0.72  GLUCOSE 101*  --  128*  --   --   --  113*   < > = values in this interval not displayed.    Liver Enzymes Recent Labs  Lab 08/10/19 0556  AST 34  ALT 27  ALKPHOS 75  BILITOT 0.6  ALBUMIN 1.6*    Electrolytes Recent Labs  Lab 08/08/19 0248 08/09/19 0158 08/10/19 0556  CALCIUM 8.3* 8.8* 9.0  MG  --  2.3  --   PHOS  --  3.5  --  CBC Recent Labs  Lab 08/08/19 0248 08/08/19 0248 08/09/19 0158 08/09/19 0158 08/09/19 1108 08/10/19 0419 08/10/19 0556  WBC 22.1*  --  25.5*  --   --   --  22.0*  HGB 7.8*   < > 8.2*   < > 8.8* 7.5* 7.4*  HCT 22.5*   < > 24.3*   < > 26.0* 22.0* 22.9*  PLT 241  --  234  --   --   --  210   < > = values in this interval not displayed.    ABG Recent Labs  Lab 09/03/2019 0410 08/09/19 1108 08/10/19 0419  PHART 7.493* 7.489* 7.417  PCO2ART 31.9* 35.4 42.4  PO2ART 69.2* 90.0 189.0*    Coag's No results for input(s): APTT, INR in the last 168 hours.  Sepsis Markers Recent Labs  Lab 08/08/19 0248  PROCALCITON 3.64    Cardiac Enzymes No results for input(s): TROPONINI, PROBNP in the last 168 hours.   Eliezer Bottom, MD Internal Medicine, PGY-1 08/10/19   8:10 AM

## 2019-08-10 NOTE — Progress Notes (Signed)
Physical Therapy Treatment Patient Details Name: Sabastien Walters MRN: [id number] DOB: 07/16/1960 Today's Date: 08/10/2019    History of Present Illness This 59 y.o. male admitted with intractable hiccups, loss of appetite, watery diarrhea, and DOE.  Pt with respiratory failure  with infiltratres and hypoxemia resulting in hi flow O2 and tranfser to [place]. Dx: acute respiratory failure with hypoxia, multifocal PNA, hyponatremia.  PMH includes: HTN. Pt with worsening pulmonary status, requiring 100% FiO2 high flow oxygen at 40L and transfer to ICU.    PT Comments    Pt remains limited in activity tolerance due to fatigue and desaturation with activity. Pt is confused and impulsive at this time, requiring PT physical assistance for mobility as well as to maintain safety. Pt is at a high falls risk due to weakness, impaired endurance, and safety awareness deficits. Pt will benefit from continued acute PT POC to assist in reducing falls risk.   Follow Up Recommendations  SNF     Equipment Recommendations  Wheelchair (measurements PT);Wheelchair cushion (measurements PT)    Recommendations for Other Services       Precautions / Restrictions Precautions Precautions: Fall;Other (comment) Precaution Comments: O2  Restrictions Weight Bearing Restrictions: No    Mobility  Bed Mobility Overal bed mobility: Needs Assistance Bed Mobility: Supine to Sit;Sit to Supine     Supine to sit: Min assist Sit to supine: Min guard      Transfers Overall transfer level: Needs assistance Equipment used: 2 person hand held assist Transfers: Sit to/from [employer] Sit to Stand: Mod assist;+2 physical assistance   Squat pivot transfers: Max assist;+2 physical assistance;+2 safety/equipment     General transfer comment: pt is generally weak, fatigues quickly with OOB activity, requires PT physical assistance to direct hip to turn to recliner  Ambulation/Gait                  Stairs             Wheelchair Mobility    Modified Rankin (Stroke Patients Only)       Balance Overall balance assessment: Needs assistance Sitting-balance support: No upper extremity supported;Feet supported Sitting balance-Leahy Scale: Fair Sitting balance - Comments: minG at edge of bed while donning socks   Standing balance support: Bilateral upper extremity supported Standing balance-Leahy Scale: Zero Standing balance comment: maxA x2                            Cognition Arousal/Alertness: Awake/alert Behavior During Therapy: Impulsive Overall Cognitive Status: Impaired/Different from baseline Area of Impairment: Memory;Following commands;Safety/judgement;Awareness                     Memory: Decreased recall of precautions;Decreased short-term memory Following Commands: Follows one step commands consistently;Follows multi-step commands with increased time Safety/Judgement: Decreased awareness of safety;Decreased awareness of deficits   Problem Solving: Slow processing        Exercises      General Comments General comments (skin integrity, edema, etc.): 70% FiO2 25L at rest, desats to 74% during transfer, PT increases oxygen to 30L with patient recovering within one minute of seated rest, weaned back to 25L      Pertinent Vitals/Pain Pain Assessment: No/denies pain    Home Living                      Prior Function            PT  Goals (current goals can now be found in the care plan section) Acute Rehab PT Goals Patient Stated Goal: get my strength back  Progress towards PT goals: Not progressing toward goals - comment(limited by poor activity tolerance)    Frequency    Min 3X/week      PT Plan Discharge plan needs to be updated    Co-evaluation PT/OT/SLP Co-Evaluation/Treatment: Yes Reason for Co-Treatment: Complexity of the patient's impairments (multi-system involvement);Necessary to address  cognition/behavior during functional activity;For patient/therapist safety;To address functional/ADL transfers          AM-PAC PT "6 Clicks" Mobility   Outcome Measure  Help needed turning from your back to your side while in a flat bed without using bedrails?: A Little Help needed moving from lying on your back to sitting on the side of a flat bed without using bedrails?: A Little Help needed moving to and from a bed to a chair (including a wheelchair)?: Total Help needed standing up from a chair using your arms (e.g., wheelchair or bedside chair)?: A Lot Help needed to walk in hospital room?: Total Help needed climbing 3-5 steps with a railing? : Total 6 Click Score: 11    End of Session Equipment Utilized During Treatment: Oxygen;Gait belt Activity Tolerance: Patient limited by fatigue Patient left: in chair;with call bell/phone within reach;with chair alarm set Nurse Communication: Mobility status PT Visit Diagnosis: Difficulty in walking, not elsewhere classified (R26.2);Other (comment)     Time: 2563-8937 PT Time Calculation (min) (ACUTE ONLY): 23 min  Charges:  $Therapeutic Activity: 8-22 mins                     Charles Walters, PT, DPT Acute Rehabilitation Pager: [phone]    Charles Walters 08/10/2019, 11:49 AM

## 2019-08-11 ENCOUNTER — Inpatient Hospital Stay: Payer: Self-pay

## 2019-08-11 ENCOUNTER — Inpatient Hospital Stay (HOSPITAL_COMMUNITY): Payer: Medicaid Other

## 2019-08-11 DIAGNOSIS — D649 Anemia, unspecified: Secondary | ICD-10-CM | POA: Clinically undetermined

## 2019-08-11 DIAGNOSIS — I82401 Acute embolism and thrombosis of unspecified deep veins of right lower extremity: Secondary | ICD-10-CM | POA: Clinically undetermined

## 2019-08-11 DIAGNOSIS — I469 Cardiac arrest, cause unspecified: Secondary | ICD-10-CM

## 2019-08-11 DIAGNOSIS — E871 Hypo-osmolality and hyponatremia: Secondary | ICD-10-CM

## 2019-08-11 LAB — COMPREHENSIVE METABOLIC PANEL
ALT: 34 U/L (ref 0–44)
AST: 37 U/L (ref 15–41)
Albumin: 1.7 g/dL — ABNORMAL LOW (ref 3.5–5.0)
Alkaline Phosphatase: 78 U/L (ref 38–126)
Anion gap: 8 (ref 5–15)
BUN: 33 mg/dL — ABNORMAL HIGH (ref 6–20)
CO2: 24 mmol/L (ref 22–32)
Calcium: 8.8 mg/dL — ABNORMAL LOW (ref 8.9–10.3)
Chloride: 107 mmol/L (ref 98–111)
Creatinine, Ser: 0.76 mg/dL (ref 0.61–1.24)
GFR calc Af Amer: >60 mL/min (ref >60)
GFR calc non Af Amer: >60 mL/min (ref >60)
Glucose, Bld: 112 mg/dL — ABNORMAL HIGH (ref 70–99)
Potassium: 4.6 mmol/L (ref 3.5–5.1)
Sodium: 139 mmol/L (ref 135–145)
Total Bilirubin: 0.5 mg/dL (ref 0.3–1.2)
Total Protein: 6 g/dL — ABNORMAL LOW (ref 6.5–8.1)

## 2019-08-11 LAB — GLUCOSE, CAPILLARY
Glucose-Capillary: 100 mg/dL — ABNORMAL HIGH (ref 70–99)
Glucose-Capillary: 101 mg/dL — ABNORMAL HIGH (ref 70–99)
Glucose-Capillary: 116 mg/dL — ABNORMAL HIGH (ref 70–99)
Glucose-Capillary: 153 mg/dL — ABNORMAL HIGH (ref 70–99)
Glucose-Capillary: 89 mg/dL (ref 70–99)

## 2019-08-11 LAB — CBC
HCT: 28 % — ABNORMAL LOW (ref 39.0–52.0)
Hemoglobin: 9 g/dL — ABNORMAL LOW (ref 13.0–17.0)
MCH: 31 pg (ref 26.0–34.0)
MCHC: 32.1 g/dL (ref 30.0–36.0)
MCV: 96.6 fL (ref 80.0–100.0)
Platelets: 195 10*3/uL (ref 150–400)
RBC: 2.9 MIL/uL — ABNORMAL LOW (ref 4.22–5.81)
RDW: 14.8 % (ref 11.5–15.5)
WBC: 48.4 10*3/uL — ABNORMAL HIGH (ref 4.0–10.5)
nRBC: 0.1 % (ref 0.0–0.2)

## 2019-08-11 LAB — POCT I-STAT 7, (LYTES, BLD GAS, ICA,H+H)
Acid-base deficit: 1 mmol/L (ref 0.0–2.0)
Bicarbonate: 28.9 mmol/L — ABNORMAL HIGH (ref 20.0–28.0)
Calcium, Ion: 1.41 mmol/L — ABNORMAL HIGH (ref 1.15–1.40)
HCT: 28 % — ABNORMAL LOW (ref 39.0–52.0)
Hemoglobin: 9.5 g/dL — ABNORMAL LOW (ref 13.0–17.0)
O2 Saturation: 100 %
Patient temperature: 97.6
Potassium: 4.6 mmol/L (ref 3.5–5.1)
Sodium: 142 mmol/L (ref 135–145)
TCO2: 31 mmol/L (ref 22–32)
pCO2 arterial: 75.9 mmHg (ref 32.0–48.0)
pH, Arterial: 7.185 — CL (ref 7.350–7.450)
pO2, Arterial: 229 mmHg — ABNORMAL HIGH (ref 83.0–108.0)

## 2019-08-11 LAB — BODY FLUID CELL COUNT WITH DIFFERENTIAL
Lymphs, Fluid: 17 %
Lymphs, Fluid: 20 %
Monocyte-Macrophage-Serous Fluid: 37 % — ABNORMAL LOW (ref 50–90)
Monocyte-Macrophage-Serous Fluid: 63 % (ref 50–90)
Neutrophil Count, Fluid: 17 % (ref 0–25)
Neutrophil Count, Fluid: 46 % — ABNORMAL HIGH (ref 0–25)
Total Nucleated Cell Count, Fluid: 13 cu mm (ref 0–1000)
Total Nucleated Cell Count, Fluid: 8 cu mm (ref 0–1000)

## 2019-08-11 LAB — COOXEMETRY PANEL
Carboxyhemoglobin: 0.9 % (ref 0.5–1.5)
Methemoglobin: 1.5 % (ref 0.0–1.5)
O2 Saturation: 37.1 %
Total hemoglobin: 9.5 g/dL — ABNORMAL LOW (ref 12.0–16.0)

## 2019-08-11 LAB — OCCULT BLOOD X 1 CARD TO LAB, STOOL: Fecal Occult Bld: NEGATIVE

## 2019-08-11 LAB — PHOSPHORUS
Phosphorus: 3.3 mg/dL (ref 2.5–4.6)
Phosphorus: 7.1 mg/dL — ABNORMAL HIGH (ref 2.5–4.6)

## 2019-08-11 LAB — PROTIME-INR
INR: 1.5 — ABNORMAL HIGH (ref 0.8–1.2)
Prothrombin Time: 17.7 seconds — ABNORMAL HIGH (ref 11.4–15.2)

## 2019-08-11 LAB — PREPARE RBC (CROSSMATCH)

## 2019-08-11 LAB — RAPID URINE DRUG SCREEN, HOSP PERFORMED
Amphetamines: NOT DETECTED
Barbiturates: NOT DETECTED
Benzodiazepines: NOT DETECTED
Cocaine: NOT DETECTED
Opiates: NOT DETECTED
Tetrahydrocannabinol: POSITIVE — AB

## 2019-08-11 LAB — MAGNESIUM
Magnesium: 1.9 mg/dL (ref 1.7–2.4)
Magnesium: 2 mg/dL (ref 1.7–2.4)

## 2019-08-11 LAB — HEMOGLOBIN AND HEMATOCRIT, BLOOD
HCT: 27.2 % — ABNORMAL LOW (ref 39.0–52.0)
Hemoglobin: 8.8 g/dL — ABNORMAL LOW (ref 13.0–17.0)

## 2019-08-11 LAB — APTT: aPTT: 30 seconds (ref 24–36)

## 2019-08-11 LAB — BRAIN NATRIURETIC PEPTIDE: B Natriuretic Peptide: 347.6 pg/mL — ABNORMAL HIGH (ref 0.0–100.0)

## 2019-08-11 LAB — ABO/RH: ABO/RH(D): A POS

## 2019-08-11 MED ORDER — MIDAZOLAM HCL 2 MG/2ML IJ SOLN
INTRAMUSCULAR | Status: AC
  Administered 2019-08-11: 2 mg via INTRAVENOUS
  Filled 2019-08-11: qty 2

## 2019-08-11 MED ORDER — FENTANYL 2500MCG IN NS 250ML (10MCG/ML) PREMIX INFUSION
0.0000 ug/h | INTRAVENOUS | Status: DC
  Administered 2019-08-11: 12:00:00 50 ug/h via INTRAVENOUS
  Filled 2019-08-11 (×2): qty 250

## 2019-08-11 MED ORDER — NOREPINEPHRINE 4 MG/250ML-% IV SOLN
0.0000 ug/min | INTRAVENOUS | Status: DC
  Administered 2019-08-11: 2 ug/min via INTRAVENOUS
  Administered 2019-08-12: 8 ug/min via INTRAVENOUS
  Administered 2019-08-12: 11:00:00 9 ug/min via INTRAVENOUS
  Filled 2019-08-11 (×4): qty 250

## 2019-08-11 MED ORDER — ORAL CARE MOUTH RINSE
15.0000 mL | OROMUCOSAL | Status: DC
  Administered 2019-08-11 – 2019-08-17 (×57): 15 mL via OROMUCOSAL

## 2019-08-11 MED ORDER — LACTATED RINGERS IV BOLUS
1000.0000 mL | Freq: Once | INTRAVENOUS | Status: AC
  Administered 2019-08-11: 1000 mL via INTRAVENOUS

## 2019-08-11 MED ORDER — ROCURONIUM BROMIDE 50 MG/5ML IV SOLN
100.0000 mg | Freq: Once | INTRAVENOUS | Status: AC
  Administered 2019-08-11: 100 mg via INTRAVENOUS

## 2019-08-11 MED ORDER — SODIUM CHLORIDE 0.9% IV SOLUTION: Freq: Once | INTRAVENOUS | Status: DC

## 2019-08-11 MED ORDER — HEPARIN (PORCINE) 25000 UT/250ML-% IV SOLN
1200.0000 [IU]/h | INTRAVENOUS | Status: DC
  Administered 2019-08-11: 1000 [IU]/h via INTRAVENOUS
  Filled 2019-08-11 (×3): qty 250

## 2019-08-11 MED ORDER — DEXMEDETOMIDINE HCL IN NACL 400 MCG/100ML IV SOLN: 0.4000 ug/kg/h | INTRAVENOUS | Status: DC

## 2019-08-11 MED ORDER — MIDAZOLAM HCL 2 MG/2ML IJ SOLN: 4.0000 mg | Freq: Once | INTRAMUSCULAR | Status: AC

## 2019-08-11 MED ORDER — SODIUM CHLORIDE 0.9 % IV SOLN
250.0000 mL | Freq: Once | INTRAVENOUS | Status: AC
  Administered 2019-08-11: 250 mL via INTRAVENOUS

## 2019-08-11 MED ORDER — SULFAMETHOXAZOLE-TRIMETHOPRIM 800-160 MG PO TABS
1.0000 | ORAL_TABLET | Freq: Every day | ORAL | Status: DC
  Administered 2019-08-11: 13:00:00 1 via ORAL
  Filled 2019-08-11 (×2): qty 1

## 2019-08-11 MED ORDER — CHLORHEXIDINE GLUCONATE 0.12% ORAL RINSE (MEDLINE KIT)
15.0000 mL | Freq: Two times a day (BID) | OROMUCOSAL | Status: DC
  Administered 2019-08-11 – 2019-08-17 (×12): 15 mL via OROMUCOSAL

## 2019-08-11 MED ORDER — VECURONIUM BROMIDE 10 MG IV SOLR
5.0000 mg | Freq: Once | INTRAVENOUS | Status: AC
  Administered 2019-08-11: 17:00:00 5 mg via INTRAVENOUS
  Filled 2019-08-11: qty 10

## 2019-08-11 MED ORDER — ATROPINE SULFATE 1 MG/10ML IJ SOSY
PREFILLED_SYRINGE | INTRAMUSCULAR | Status: AC
  Filled 2019-08-11: qty 10

## 2019-08-11 MED ORDER — STARCH (THICKENING) PO POWD: ORAL | Status: DC | PRN

## 2019-08-11 MED ORDER — FENTANYL CITRATE (PF) 100 MCG/2ML IJ SOLN: 100.0000 ug | Freq: Once | INTRAMUSCULAR | Status: AC

## 2019-08-11 MED ORDER — RESOURCE THICKENUP CLEAR PO POWD
ORAL | Status: DC | PRN
  Filled 2019-08-11: qty 125

## 2019-08-11 MED ORDER — ALTEPLASE (PULMONARY EMBOLISM) INFUSION
50.0000 mg | Freq: Once | INTRAVENOUS | Status: AC
  Administered 2019-08-11: 18:00:00 50 mg via INTRAVENOUS
  Filled 2019-08-11: qty 50

## 2019-08-11 MED ORDER — DEXMEDETOMIDINE HCL IN NACL 400 MCG/100ML IV SOLN
0.4000 ug/kg/h | INTRAVENOUS | Status: DC
  Administered 2019-08-11: 0.4 ug/kg/h via INTRAVENOUS
  Filled 2019-08-11 (×2): qty 100

## 2019-08-11 MED ORDER — FENTANYL 2500MCG IN NS 250ML (10MCG/ML) PREMIX INFUSION: 0.0000 ug/h | INTRAVENOUS | Status: DC

## 2019-08-11 MED ORDER — FENTANYL CITRATE (PF) 100 MCG/2ML IJ SOLN
INTRAMUSCULAR | Status: AC
  Administered 2019-08-11: 12:00:00 100 ug via INTRAVENOUS
  Filled 2019-08-11: qty 2

## 2019-08-11 MED ORDER — MIDAZOLAM HCL 2 MG/2ML IJ SOLN
4.0000 mg | INTRAMUSCULAR | Status: DC | PRN
  Administered 2019-08-11: 4 mg via INTRAVENOUS
  Administered 2019-08-12 (×2): 2 mg via INTRAVENOUS
  Administered 2019-08-15 – 2019-08-17 (×2): 4 mg via INTRAVENOUS
  Filled 2019-08-11 (×5): qty 4

## 2019-08-11 MED ORDER — STERILE WATER FOR INJECTION IJ SOLN
INTRAMUSCULAR | Status: AC
  Administered 2019-08-11: 10 mL
  Filled 2019-08-11: qty 10

## 2019-08-11 MED ORDER — ETOMIDATE 2 MG/ML IV SOLN
20.0000 mg | Freq: Once | INTRAVENOUS | Status: AC
  Administered 2019-08-11: 20 mg via INTRAVENOUS

## 2019-08-11 NOTE — Plan of Care (Signed)

## 2019-08-11 NOTE — Procedures (Signed)
Critical care echocardiogram report  Asked to perform bedside echocardiography for patient with respiratory failure and shock.  Technically difficult study. Hyperdynamic LV function, LV low normal size.  RV normal size and function.  No septal shift.  RVSP estimated 30 mmHg.  IVC is collapsed.  No D sign.  No significant valvular abnormalities.  Mild MR mild TR AV normal.    Lynnell Catalan, MD [facility] ICU Physician [facility] [place] [facility]  Pager: [phone] Mobile: [phone] After hours: [phone].  08/11/2019, 6:55 PM

## 2019-08-11 NOTE — Progress Notes (Signed)
eLink Physician-Brief Progress Note Patient Name: Charles Walters DOB: 26-Apr-1961 MRN: [id number]   Date of Service  08/11/2019  HPI/Events of Note  eLink rounds. Patient s/p bradycardic code and received TPA for presumed PE. Labs at bedside  eICU Interventions  Added CMP and VBG to labs Avoiding arterial sticks     Intervention Category Major Interventions: Other:  Darl Pikes 08/11/2019, 8:44 PM

## 2019-08-11 NOTE — Progress Notes (Signed)
eLink Physician-Brief Progress Note Patient Name: Charles Walters DOB: [birth year] MRN: [id number]   Date of Service  08/11/2019  HPI/Events of Note  Notified of Hgb 6.9 form 7.4. No sign of active bleed  eICU Interventions  Ordered to transfuse 1 unit PRBC Repeat H/H post transfusion as patient now on full dose anticoagulation for acute DVT     Intervention Category Intermediate Interventions: Bleeding - evaluation and treatment with blood products  Darl Pikes 08/11/2019, 4:37 AM

## 2019-08-11 NOTE — Procedures (Signed)
Intubation Procedure Note Charles Walters [id number] 26-Dec-1960  Procedure: Intubation Indications: Prior to bronchoscopy  Procedure Details Consent: Risks of procedure as well as the alternatives and risks of each were explained to the (patient/caregiver).  Consent for procedure obtained. Time Out: Verified patient identification, verified procedure, site/side was marked, verified correct patient position, special equipment/implants available, medications/allergies/relevent history reviewed, required imaging and test results available.  Performed  Maximum sterile technique was used including cap, gloves, gown and hand hygiene.  MAC 3 Sedation with etomidate, roc, fent and versed Grade 3 view of cords, large epiglottis, anterior   Evaluation Hemodynamic Status: BP stable throughout; O2 sats: stable throughout Patient's Current Condition: stable Complications: No apparent complications Patient did tolerate procedure well. Chest X-ray ordered to verify placement.  CXR: pending.   Charles Walters Charles Walters 08/11/2019

## 2019-08-11 NOTE — Progress Notes (Signed)
Patient placed on nitric at 20ppm per MD order.

## 2019-08-11 NOTE — Progress Notes (Signed)
Consult received for PICC placement. Call 2H to speak with patient's nurse. Secretary answered and stated that the patient was arresting and a Code was called. Will follow up with primary later to address PICC needs.

## 2019-08-11 NOTE — Progress Notes (Signed)
Responded to Code Blue. Spoke with patient's nurse after she had spoken with patient's family on the phone.  Family is not from local area.  Nurse will speak with family again as this situation progresses.  Nurse will page Chaplain if family is in need of spiritual care.  Vernell Morgans Chaplain Resident

## 2019-08-11 NOTE — Progress Notes (Signed)
Spoke to daughter regarding consent for procedure- bronchoscopy after dr Tonia Brooms spoke to her. She had hesitation about consent and wanted to conference call with her Aunt. Did not answer, wanted then to call her uncle Casimiro Needle. Lost connection called back and then more questions were asked . Explained ETT, ventilation, reason for bronchoscopy and reiterated Dr Tonia Brooms stated reagarding brochial washings, reason for intubation. Achieved consent from daughter via telephone.. Patient unable to consent, not oriented.

## 2019-08-11 NOTE — Progress Notes (Addendum)
PULMONARY / CRITICAL CARE MEDICINE   NAME:  Charles Walters, MRN:  [id number], DOB:  [birth year], LOS: 73 ADMISSION DATE:  07/26/2019, CONSULTATION DATE:  07/30/2019 REFERRING MD:  Grandville Silos, CHIEF COMPLAINT:  Hypoxemic respiratory failure  BRIEF HISTORY:    The patient is a 59 year old gentleman who presented 08/05/2019 with hiccups, loss of appetite, hyponatremia, watery diarrhea.  He was also short of breath on exertion.  He had a grossly abnormal chest x-ray and was found to be incidentally hypoxemic.  He was seen initially by pulmonary critical care team and treated empirically for community-acquired pneumonia.  However over the last week he has continued to decline, had worsening hypoxemia, and had a repeat CT chest which shows persistent bilateral lower lobe predominant groundglass opacities with interlobular septal thickening.  Is being called back to evaluate these opacities in the setting of hypoxemic respiratory failure.  SIGNIFICANT PAST MEDICAL HISTORY   Hypertension  SIGNIFICANT EVENTS:  2/22: PCCM consulted and recommended for CAP coverage 3/1: PCCM consulted for persistent infiltrates and hypoxemia, planned for bronch AM of 3/2 for which transferred to ICU; bronch cancelled for worsening O2 requirements   STUDIES:   2/17: CT Chest wo Contrast: multifocal pneumonia, viral vs atypical; mild aneurysmal dilation of ascending aorta up to 4.2cm  2/18: CT Angio Chest: No PE; nonspecific multifocal pneumonia of atypical etiology 3/1: CT Chest wo Contrast: extensive worsening crazy paving pattern (ground glass opacity + interlobular septal thickening) throughout peripheral lungs bilaterally involving all lung lobes; new patchy regions of consolidative airspace disease in lungs bilaterally; most prominent in lower lobes. Given interval worsening, considerations include acute interstitial pneumonia/ARDS, atypical infection or pulmonary alveolar proteinosis; small dependent bilateral pleural effusions;  dilated main pulmonary artery suggesting pulmonary arterial hypertension; stable ascending thoracic aorta aneurysm.   CULTURES:  Cultures no growth to date Respiratory virus panel negative MRSA PCR negative Covid negative 3/3: Blood cultures x2> neg to date 3/3: Urine legionella>  3/3: Urine strep>   ANTIBIOTICS:  Vancomycin 2/22> 3/2 Cefepime 2/25> 3/2 Bactrim 3/2 Azithromycin 3/2  CONSULTANTS:  PCCM  SUBJECTIVE:   Patient remains in intensive care unit on heated high flow nasal cannula.  He is alert.  Discussed need for intubation and bronchoscopy and having a better understanding of what is going on in his lungs.  Patient is agreeable.  Anemic overnight transfused x1.  CONSTITUTIONAL: BP 135/88   Pulse 97   Temp 97.6 F (36.4 C) (Oral)   Resp (!) 30   Ht 6\' 1"  (1.854 m)   Wt 64.4 kg   SpO2 100%   BMI 18.73 kg/m   I/O last 3 completed shifts: In: 2577.3 [P.O.:1520; I.V.:748.1; NG/GT:309.2] Out: 2255 [Urine:2255]  FiO2 (%):  [70 %-80 %] 70 %  CBC Latest Ref Rng & Units 08/11/2019 08/10/2019 08/10/2019  WBC 4.0 - 10.5 K/uL 21.2(H) 22.0(H) -  Hemoglobin 13.0 - 17.0 g/dL 6.9(LL) 7.4(L) 7.5(L)  Hematocrit 39.0 - 52.0 % 20.5(L) 22.9(L) 22.0(L)  Platelets 150 - 400 K/uL 187 210 -   CMP Latest Ref Rng & Units 08/11/2019 08/10/2019 08/10/2019  Glucose 70 - 99 mg/dL 112(H) 113(H) -  BUN 6 - 20 mg/dL 33(H) 37(H) -  Creatinine 0.61 - 1.24 mg/dL 0.76 0.72 -  Sodium 135 - 145 mmol/L 139 144 144  Potassium 3.5 - 5.1 mmol/L 4.6 4.5 4.0  Chloride 98 - 111 mmol/L 107 110 -  CO2 22 - 32 mmol/L 24 26 -  Calcium 8.9 - 10.3 mg/dL 8.8(L) 9.0 -  Total  Protein 6.5 - 8.1 g/dL 6.0(L) 6.1(L) -  Total Bilirubin 0.3 - 1.2 mg/dL 0.5 0.6 -  Alkaline Phos 38 - 126 U/L 78 75 -  AST 15 - 41 U/L 37 34 -  ALT 0 - 44 U/L 34 27 -   PHYSICAL EXAM: General: Elderly male, poor dentition resting in bed on heated high flow Neuro: Awake alert oriented will follow commands, is sleepy HEENT: NCAT, tracking  appropriately Cardiovascular: Tachycardic, S1-S2 Lungs: Bilateral crackles, no rhonchi Abdomen: Soft, nontender nondistended Musculoskeletal: No significant edema Skin: No obvious rash  RESOLVED PROBLEM LIST   ASSESSMENT AND PLAN    Acute hypoxemic respiratory failure  Acute interstitial pneumonia versus alveolar hemorrhage. Differential diagnosis follows that of "crazy paving" Areas of groundglass with interlobular septal thickening. -Plans for intubation and bedside bronchoscopy in the ICU -Remains on IV steroids at this time. -Transfused hemoglobin today. -No other overt sign of bleeding -Also sent UDS, possible history of cocaine use.  Discussed with daughter on phone. -We will send usual cultures, RBC count, regular cell count with differential, PCP DFA, fungus and AFB, anti-GMCSF Ab  Acute metabolic encephalopathy likely 2/2 delirium and respiratory failure Continue to monitor  Severe protein calorie malnutrition Patient pulled core track last night. After intubation we will plan for OG tube placement restart tube feeds  Leukocytosis w/bandemia: Continue to observe  Acute right lower extremity DVT -On Lovenox  Best Practice / Goals of Care / Disposition.   Diet: regular diet; will need tube feeds given inadequate PO intake Pain/Anxiety/Delirium protocol (if indicated): n/a VAP protocol (if indicated):n/a DVT prophylaxis: lovenox GI prophylaxis: Protonix Glucose control: monitor CBG Foley condom cath Mobility: bed rest Code Status: Full Family Communication: Spoke with patient's daughter via phone.  Obtain consent for intubation and bronchoscopy. Disposition: ICU  LABS  Glucose Recent Labs  Lab 08/10/19 0811 08/10/19 1209 08/10/19 1643 08/10/19 2302 08/11/19 0419 08/11/19 0754  GLUCAP 118* 151* 118* 122* 116* 101*    BMET Recent Labs  Lab 08/09/19 0158 08/09/19 1108 08/10/19 0419 08/10/19 0556 08/11/19 0345  NA 144   < > 144 144 139  K 4.4    < > 4.0 4.5 4.6  CL 108  --   --  110 107  CO2 23  --   --  26 24  BUN 35*  --   --  37* 33*  CREATININE 0.82  --   --  0.72 0.76  GLUCOSE 128*  --   --  113* 112*   < > = values in this interval not displayed.    Liver Enzymes Recent Labs  Lab 08/10/19 0556 08/11/19 0345  AST 34 37  ALT 27 34  ALKPHOS 75 78  BILITOT 0.6 0.5  ALBUMIN 1.6* 1.7*    Electrolytes Recent Labs  Lab 08/09/19 0158 08/09/19 0158 08/10/19 0556 08/10/19 1418 08/10/19 1700 08/11/19 0345  CALCIUM 8.8*  --  9.0  --   --  8.8*  MG 2.3   < >  --  2.0 1.9 1.9  PHOS 3.5   < >  --  2.3* 2.6 3.3   < > = values in this interval not displayed.    CBC Recent Labs  Lab 08/09/19 0158 08/09/19 1108 08/10/19 0419 08/10/19 0556 08/11/19 0345  WBC 25.5*  --   --  22.0* 21.2*  HGB 8.2*   < > 7.5* 7.4* 6.9*  HCT 24.3*   < > 22.0* 22.9* 20.5*  PLT 234  --   --  210 187   < > = values in this interval not displayed.    ABG Recent Labs  Lab 08/30/19 0410 08/09/19 1108 08/10/19 0419  PHART 7.493* 7.489* 7.417  PCO2ART 31.9* 35.4 42.4  PO2ART 69.2* 90.0 189.0*    Coag's No results for input(s): APTT, INR in the last 168 hours.  Sepsis Markers Recent Labs  Lab 08/08/19 0248  PROCALCITON 3.64    Cardiac Enzymes No results for input(s): TROPONINI, PROBNP in the last 168 hours.   Josephine Igo, DO [facility] 08/11/2019 10:38 AM

## 2019-08-11 NOTE — Progress Notes (Signed)
ANTICOAGULATION CONSULT NOTE - Initial Consult  Pharmacy Consult for lovenox>>heparin Indication: DVT , R/o PE No Known Allergies  Patient Measurements: Height: 6\' 1"  (185.4 cm) Weight: 141 lb 15.6 oz (64.4 kg) IBW/kg (Calculated) : 79.9 Heparin Dosing Weight: 64kg  Vital Signs: Temp: 98.6 F (37 C) (03/06 1615) Temp Source: Axillary (03/06 1615) BP: 114/77 (03/06 1815) Pulse Rate: 133 (03/06 1815)  Labs: Recent Labs    08/09/19 0158 08/09/19 1108 08/10/19 0556 08/10/19 0556 08/11/19 0345 08/11/19 0345 08/11/19 1313 08/11/19 1329  HGB 8.2*   < > 7.4*   < > 6.9*   < > 9.5* 8.8*  HCT 24.3*   < > 22.9*   < > 20.5*  --  28.0* 27.2*  PLT 234  --  210  --  187  --   --   --   CREATININE 0.82  --  0.72  --  0.76  --   --   --    < > = values in this interval not displayed.    Estimated Creatinine Clearance: 92.8 mL/min (by C-G formula based on SCr of 0.76 mg/dL).   Medical History: Past Medical History:  Diagnosis Date  . Hypertension    Assessment: 59 year old male with newly diagnosed DVT 3/5 placed on full dose lovenox last night. Patient became bradycardic then asystole and CPR was started. Bedside echo with concern for PE, tpa given, will now transition to IV heparin.   Hgb was down to 6.9 last night but up to 8.8 this afternoon after 1 unit this morning.   Will start IV heparin once aptt down<80s.  Aptt 30 post tpa, will start heparin now.   Goal of Therapy:  Heparin level 0.3-0.7 units/ml Monitor platelets by anticoagulation protocol: Yes   Plan:  Start heparin infusion at 1000 units/hr Check anti-Xa level in 6 hours and daily while on heparin Continue to monitor H&H and platelets  59 PharmD., BCPS Clinical Pharmacist 08/11/2019 6:47 PM

## 2019-08-11 NOTE — Procedures (Addendum)
Bronchoscopy Procedure Note Charles Walters [id number] [birth year]  Procedure: Bronchoscopy Indications: Diagnostic evaluation of the airways and Obtain specimens for culture and/or other diagnostic studies  Procedure Details Consent: Risks of procedure as well as the alternatives and risks of each were explained to the (patient/caregiver).  Consent for procedure obtained. Time Out: Verified patient identification, verified procedure, site/side was marked, verified correct patient position, special equipment/implants available, medications/allergies/relevent history reviewed, required imaging and test results available.  Performed  In preparation for procedure, patient was given 100% FiO2 and bronchoscope lubricated. Sedation: Benzodiazepines, Muscle relaxants and Etomidate and roc following intubation   Airway entered and the following bronchi were examined: RUL, RML, RLL, LUL, LLL and Bronchi.   Procedures performed: BAL to LUL, 3 aliquots, serial to r/o DAH, all were just cloudy with no blood. RML BAL also cloudy with no blood X 1 aliquot of saline. Scope was used for therapeutic suctioning of all secretions and lower lobe mucus plugging.  Bronchoscope removed. Patient placed back on 100% FiO2 at conclusion of procedure.    Evaluation Hemodynamic Status: BP stable throughout; O2 sats: stable throughout Patient's Current Condition: stable Specimens:  Sent LUL and RML cloudy BAL fluid  Complications: No apparent complications Patient did tolerate procedure well.   Charles Walters 08/11/2019

## 2019-08-11 NOTE — Progress Notes (Addendum)
PCCM interval progress note:  Pt rounded on after PEA arrest several hours ago and presumed PE s/p TPA.   Pt is maintaining MAP >65 on Levophed.   Pt has had no sedation since 1736 when Fentanyl and Precedex discontinued.  Pt is currently unresponsive to pain, slight frown to gag, bilateral corneal reflexes intact and pt is breathing over the vent.   Plan: -continue Levophed -VBG and labs pending -If neuro exam remains poor for the next few hours then CT head post TPA -Given hypotension on inhaled NO and PEEP of 10, see if PEEP can be reduced pending VBG    4:14 AM -CT head shows acute MCA CVA, MRI ordered and have consulted Neurology, appreciate recs  Darcella Gasman Chloee Tena, PA-C

## 2019-08-11 NOTE — Progress Notes (Signed)
Spoke with Chelsea Aus, RN concerning PICC order. Patient is still hypotensive and getting vasopressors. He has two PIVs that are functional. She was informed to have a central line placed if needed. If patient stable, plan to place in am.

## 2019-08-11 NOTE — Progress Notes (Addendum)
PROGRESS NOTE    Charles Walters  IRW:[id number] DOB: [birth year] DOA: 07/14/2019 PCP: Marliss Coots, NP    Brief Narrative:  Patient was admitted to the hospital with a working diagnosis of community-acquired pneumonia complicated by acute hypoxic respiratory failure and severe hyponatremia.Patient with initially worsening pneumonia suspected multidrug resistant bacteria/ atypical pneumoniaor acute interstitial pneumonia  59 year old male who presented with hiccups. He does have significant past medical history for hypertension. He reported about 1 week of intractable hiccups associated with loss of appetite, and watery diarrhea. Positive dyspnea on exertion. On his initial physical examination blood pressure 156/119, heart rate 108, respiratory rate 27, oxygen saturation 98% on supplemental oxygen, his lungs had coarse breath sounds bilaterally, no wheezing, heart S1-S2 present and rhythmic, abdomen was soft and nontender, no lower extremity edema. Sodium 114, potassium 3.0, chloride 80, bicarb 17, glucose 126, BUN 7, creatinine 0.77, white count 10.9, hemoglobin 12.3, hematocrit 33.1, platelets 235. SARS COVID-19 negative. Urinary osmolarity 507, urine sodium <10, alcohol less than 10.His chest radiograph had bilateral bibasilar reticulonodular infiltrates, predominantly right base. EKG 128 bpm, normal axis, normal intervals, right atrial enlargement, sinus rhythm, poor R wave progression, LVH, no ST segment T wave changes.  Patient received hypertonic saline and intravenous antibiotics, further work-up with CT chest negative for pulmonary embolism, echocardiography with preserved LV and RV systolic function.  His discharge was cancelled due to worsening hypoxemic respiratory failure, along with worsening pulmonary radiographic changes,bilateral infiltrates at bases, more dense on the leftbase, . Resumed antibiotic therapy with vancomycin and cefepime, suspected multidrug resistant  bacterial pneumonia/ atypical pneumonia or acute interstitial process.  Patient with rapid worsening respiratory failure despite broad spectrum antibiotic therapy, follow CT chest with worsening infiltrates, suspected acute interstitial pneumonia/ pneumocystis.   03/02 Transferred to ICU for heated high flow nasal cannula therapy.      Assessment & Plan:   Principal Problem:   Acute respiratory failure with hypoxia (HCC) Active Problems:   Hyponatremia   Intractable hiccups   Diarrhea   Asthenia   Essential hypertension   Community acquired bacterial pneumonia   Acute interstitial pneumonia (HCC)   Leukocytosis   Sinus tachycardia   Tobacco abuse   Severe protein-calorie malnutrition (HCC)   Acute deep vein thrombosis (DVT) of right lower extremity (HCC)   Anemia  1 acute hypoxemic respiratory failure Initially felt to be secondary to community-acquired pneumonia (atypical/viral) bilaterally.??  Acute interstitial pneumonitis versus ARDS. Patient with worsening hypoxic respiratory status/failure currently on high flow nasal cannula at 25 L/min with 70% FiO2 with sats of 100% and tachypneic with a respiratory rate of 35.  Patient with use of accessory muscles of respiration.  ABG the morning of 08/10/2019, with a pH of 7.4/PCO2 of 42/PO2 of 189/bicarb of 27.  ABG done on 08/14/2019 with a pH of 7.43/PCO2 of 32/PO2 of 69/bicarb of 24.  Repeat CT chest done on 08/06/2019 with extensive worsening crazy paving pattern (groundglass opacity plus interlobular septal thickening) throughout the peripheral lungs bilaterally involving all lung lobes.  New patchy regions of consolidative airspace disease in the lungs bilaterally most prominent in the lower lobes.  Given interval worsening considerations include acute interstitial pneumonia/ARDS, atypical infection of pulmonary alveolar proteinosis.  Small dependent bilateral pleural effusions.  Dilated pulmonary arteries suggesting pulmonary arterial  hypertension.  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  Status post 2-week course of empiric IV vancomycin, Levaquin, cefepime.  Patient received a dose of IV azithromycin and a dose of IV Bactrim.  Still with a leukocytosis of 25.  Repeat chest x-ray with persistent pulmonary infiltrates consistent with multifocal pneumonia. Respiratory viral panel negative.  MRSA PCR negative.  SARS coronavirus 2 PCR negative.  Patient seen in consultation by ID and who had recommended finishing 14-day course of antibiotics which was completed 08/12/2019.  Urine Legionella antigen and strep pneumococcus antigen pending.  ID recommending bronchoscopy if patient was to get intubated due to worsening respiratory failure.  Pulmonary consulted as well and are following and were in agreement with discontinuation of antibiotics on 08/16/2019.  Patient started on IV steroids (08/08/2019) which we will continue.  Patient with worsening respiratory status will likely get fatigued and likely be intubated.  Continue flutter valve.  Incentive spirometry.  Patient with worsening respiratory status may need intubation however will defer to PCCM.  ID recommended that if patient were to be on prednisone 20 mg daily > 30 days then will place on Bactrim DS daily for OI prophylaxis.  Per PCCM patient likely will require long-term prednisone for greater than 30 days and as such we will start Bactrim DS daily for P JP prophylaxis.   ID signed off on 08/09/2019.  Pulmonary following and appreciate input and recommendations.  2.  Severe protein calorie malnutrition Core track placed and nutrition started however patient pulled out core track overnight.  Core track tube has been left out.  Will monitor patient's oral intake to reassess for need for tube feeds.  Dietitian following.  SLP following and patient for swallow evaluation.   3.  Leukocytosis with bandemia Patient noted to have a bandemia.  Patient was pancultured with negative blood  cultures from 08/02/2019.  MRSA PCR negative.  Respiratory viral panel negative.  SARS coronavirus 2 PCR negative.  Status post 14 days of broad-spectrum antibiotics which included vancomycin, cefepime, Bactrim, azithromycin.  Leukocytosis currently at 21.2.  ID following recommended no further antibiotics however did state if on steroids of prednisone 20 mg for greater than 30 days will need daily Bactrim for prophylaxis.  ID signed off as of 08/09/2019.  Per PCCM patient will likely require greater than 20 mg of prednisone for 4+ weeks and recommended starting Bactrim for P JP prophylaxis which we did early on this morning.  Follow.  4.  Severe hypovolemic hypoosmolar hyponatremia Resolved.  Felt secondary to hypovolemia in the setting of ARB and HCTZ.  Currently resolved.  Sodium at 139.  Follow renal function, electrolytes.  Avoid hypotension.  Follow.  Nephrology was following and have signed off.  5.  Hypertension Blood pressure improved with hydration.  Continue to hold antihypertensive medications.  Patient currently receiving transfusion of a unit of packed red blood cells.   6.  Sinus tachycardia Likely secondary to problem #1.  Some improvement with tachycardia with hydration and now patient being transfused a unit of packed red blood cells.  IV Lopressor as needed.   7.  Tobacco abuse/alcohol use Per Dr. Cathlean Sauer patient drinks alcohol at home daily 1 beer daily and smokes 3 to 4 cigarettes daily.  Nicotine patch.  Multivitamin, thiamine.  Patient with no signs of withdrawal.  Follow.    8.  Acute diarrhea Resolved.  9.  Right lower extremity DVT Lower extremity Dopplers with acute DVT in the right popliteal vein, right posterior tibial veins, right peroneal veins, right gastrinomas vein.  Patient on treatment dose Lovenox.  10. Anemia, NOS Hemoglobin noted to be 6.9 this morning.  Patient with no overt bleeding.  Patient noted to have  a brown stool this morning.  Could be partly  dilutional.  Unit of packed red blood cells has been ordered and currently being transfused.  Follow H&H.  Patient started on anticoagulation for right lower extremity DVT.   DVT prophylaxis: Lovenox Code Status: Full Family Communication: Updated patient. Updated daughter Gaige Sebo via telephone. Disposition Plan:  . Patient came from: Home            . Anticipated d/c place: To be determined.  Home versus SNF . Barriers to d/c OR conditions which need to be met to effect a safe d/c: To be determined.  Home versus SNF when clinically improved with no further O2 requirements and cleared by pulmonary.   Consultants:   Nephrology: Dr. Marval Regal 08/03/2019  Pulmonary and critical care medicine: Dr. Loanne Drilling 07/30/2019  Pulmonary critical care medicine: Dr. Shearon Stalls 09/05/2019  Infectious disease: Dr. Baxter Flattery 08/27/2019  Procedures:   CT chest 07/15/2019, 08/06/2019  CT angiogram chest 07/26/2019  2D echo 07/26/2019  Lower extremity Dopplers 08/10/2019  Antimicrobials:   IV Levaquin 07/28/2019>>>>> 08/02/2019  IV azithromycin 03/24/2021x1 dose  IV cefepime 08/02/2019>>>> 08/08/2019  IV Bactrim 08/07/2018 21x1 dose  IV vancomycin 07/30/2019>>>>> 08/08/2019  Bactrim DS daily 08/11/2019   Subjective: Patient in bed on 25 L heated high flow O2 with FiO2 of 70% with sats of 100%.  Patient eyes closed but answering questions.  Following commands.  Denies any overt bleeding.  Events overnight noted with patient pulling out coretrack.  Patient currently receiving a unit of packed red blood cells.   Objective: Vitals:   08/11/19 0801 08/11/19 0845 08/11/19 0900 08/11/19 0915  BP:   133/89 135/88  Pulse:  97 92 97  Resp:  (!) 25 (!) 29 (!) 30  Temp:  97.6 F (36.4 C) 97.6 F (36.4 C)   TempSrc:  Oral Oral   SpO2: 97% 100% 100% 100%  Weight:      Height:        Intake/Output Summary (Last 24 hours) at 08/11/2019 1014 Last data filed at 08/11/2019 0800 Gross per 24 hour  Intake 1311.34 ml    Output 980 ml  Net 331.34 ml   Filed Weights   08/14/2019 0459 08/09/19 0415 08/11/19 0500  Weight: 59.8 kg 59.7 kg 64.4 kg    Examination:  General exam: On 25 L of heated high flow nasal cannula O2 with FiO2 of 70% with sats of 100%.  Slight decreased use of accessory muscles of respiration.  Increased respiratory rate.  Frail.  Cachectic.  Temporal wasting.  Respiratory system: Some coarse breath sounds anterior lung fields.  No wheezing.  Poor air movement.  Tachypneic.  Some use of accessory muscles of respiration. Cardiovascular system: Tachycardic.  No murmurs rubs or gallops.  No JVD.  No lower extremity edema.  Gastrointestinal system: Abdomen is soft, nontender, nondistended, positive bowel sounds.  No rebound.  No guarding. Central nervous system: Sleeping but arousable.  Following commands.  Moving extremities spontaneously.  Extremities: Symmetric 5 x 5 power. Skin: No rashes, lesions or ulcers Psychiatry: Judgement and insight appear fair. Mood & affect appropriate.     Data Reviewed: I have personally reviewed following labs and imaging studies  CBC: Recent Labs  Lab 08/09/2019 0131 08/31/2019 0131 08/08/19 0248 08/08/19 0248 08/09/19 0158 08/09/19 1108 08/10/19 0419 08/10/19 0556 08/11/19 0345  WBC 23.7*  --  22.1*  --  25.5*  --   --  22.0* 21.2*  NEUTROABS 19.9*  --  18.4*  --  22.3*  --   --  18.6* 17.5*  HGB 8.6*   < > 7.8*   < > 8.2* 8.8* 7.5* 7.4* 6.9*  HCT 24.6*   < > 22.5*   < > 24.3* 26.0* 22.0* 22.9* 20.5*  MCV 90.4  --  90.4  --  90.7  --   --  95.8 92.8  PLT 282  --  241  --  234  --   --  210 187   < > = values in this interval not displayed.   Basic Metabolic Panel: Recent Labs  Lab 08/23/2019 0131 08/26/2019 0131 08/08/19 0248 08/08/19 0248 08/09/19 0158 08/09/19 1108 08/10/19 0419 08/10/19 0556 08/10/19 1418 08/10/19 1700 08/11/19 0345  NA 135   < > 135   < > 144 144 144 144  --   --  139  K 4.2   < > 3.6   < > 4.4 4.1 4.0 4.5  --   --   4.6  CL 102  --  101  --  108  --   --  110  --   --  107  CO2 22  --  23  --  23  --   --  26  --   --  24  GLUCOSE 98  --  101*  --  128*  --   --  113*  --   --  112*  BUN 11  --  20  --  35*  --   --  37*  --   --  33*  CREATININE 0.67  --  0.88  --  0.82  --   --  0.72  --   --  0.76  CALCIUM 8.3*  --  8.3*  --  8.8*  --   --  9.0  --   --  8.8*  MG  --   --   --   --  2.3  --   --   --  2.0 1.9 1.9  PHOS  --   --   --   --  3.5  --   --   --  2.3* 2.6 3.3   < > = values in this interval not displayed.   GFR: Estimated Creatinine Clearance: 92.8 mL/min (by C-G formula based on SCr of 0.76 mg/dL). Liver Function Tests: Recent Labs  Lab 08/10/19 0556 08/11/19 0345  AST 34 37  ALT 27 34  ALKPHOS 75 78  BILITOT 0.6 0.5  PROT 6.1* 6.0*  ALBUMIN 1.6* 1.7*   No results for input(s): LIPASE, AMYLASE in the last 168 hours. No results for input(s): AMMONIA in the last 168 hours. Coagulation Profile: No results for input(s): INR, PROTIME in the last 168 hours. Cardiac Enzymes: No results for input(s): CKTOTAL, CKMB, CKMBINDEX, TROPONINI in the last 168 hours. BNP (last 3 results) No results for input(s): PROBNP in the last 8760 hours. HbA1C: Recent Labs    08/09/19 1018  HGBA1C 6.0*   CBG: Recent Labs  Lab 08/10/19 1209 08/10/19 1643 08/10/19 2302 08/11/19 0419 08/11/19 0754  GLUCAP 151* 118* 122* 116* 101*   Lipid Profile: No results for input(s): CHOL, HDL, LDLCALC, TRIG, CHOLHDL, LDLDIRECT in the last 72 hours. Thyroid Function Tests: No results for input(s): TSH, T4TOTAL, FREET4, T3FREE, THYROIDAB in the last 72 hours. Anemia Panel: No results for input(s): VITAMINB12, FOLATE, FERRITIN, TIBC, IRON, RETICCTPCT in the last 72 hours. Sepsis Labs: Recent Labs  Lab 08/08/19 0248  PROCALCITON  3.64    Recent Results (from the past 240 hour(s))  Culture, blood (routine x 2)     Status: None   Collection Time: 08/02/19  5:50 PM   Specimen: BLOOD  Result Value  Ref Range Status   Specimen Description BLOOD LEFT ANTECUBITAL  Final   Special Requests   Final    BOTTLES DRAWN AEROBIC AND ANAEROBIC Blood Culture adequate volume   Culture   Final    NO GROWTH 5 DAYS Performed at [facility], [address] [place], [place] [address]    Report Status 08/08/2019 FINAL  Final  Culture, blood (routine x 2)     Status: None   Collection Time: 08/02/19  6:00 PM   Specimen: BLOOD RIGHT HAND  Result Value Ref Range Status   Specimen Description BLOOD RIGHT HAND  Final   Special Requests   Final    BOTTLES DRAWN AEROBIC AND ANAEROBIC Blood Culture adequate volume   Culture   Final    NO GROWTH 5 DAYS Performed at [facility], [place] [address], [place], [place] [address]    Report Status 08/19/2019 FINAL  Final  MRSA PCR Screening     Status: Abnormal   Collection Time: 08/03/19  6:30 PM   Specimen: Nasal Mucosa; Nasopharyngeal  Result Value Ref Range Status   MRSA by PCR (A) NEGATIVE Final    INVALID, UNABLE TO DETERMINE THE PRESENCE OF TARGET DUE TO SPECIMEN INTEGRITY. RECOLLECTION REQUESTED.    CommentConcha Pyo RN [id number] 08/03/19 A BROWNING Performed at [facility], [place] [address] [place], [place] [address]   MRSA PCR Screening     Status: None   Collection Time: 08/08/2019  5:01 AM   Specimen: Nasal Mucosa; Nasopharyngeal  Result Value Ref Range Status   MRSA by PCR NEGATIVE NEGATIVE Final    Comment:        The GeneXpert MRSA Assay (FDA approved for NASAL specimens only), is one component of a comprehensive MRSA colonization surveillance program. It is not intended to diagnose MRSA infection nor to guide or monitor treatment for MRSA infections. Performed at [facility], [place] [address] [place], [place] [address]   Culture, blood (Routine X 2) w Reflex to ID Panel     Status: None (Preliminary result)   Collection Time: 08/08/19  2:08 PM   Specimen: BLOOD  Result Value Ref Range Status   Specimen Description  BLOOD RIGHT ANTECUBITAL  Final   Special Requests   Final    BOTTLES DRAWN AEROBIC ONLY Blood Culture results may not be optimal due to an inadequate volume of blood received in culture bottles   Culture   Final    NO GROWTH 2 DAYS Performed at [facility], [place] [address], [place], [place] [address]    Report Status PENDING  Incomplete  Culture, blood (Routine X 2) w Reflex to ID Panel     Status: None (Preliminary result)   Collection Time: 08/08/19  2:09 PM   Specimen: BLOOD LEFT WRIST  Result Value Ref Range Status   Specimen Description BLOOD LEFT WRIST  Final   Special Requests   Final    BOTTLES DRAWN AEROBIC ONLY Blood Culture results may not be optimal due to an inadequate volume of blood received in culture bottles   Culture   Final    NO GROWTH 2 DAYS Performed at [facility], [place] [address], [place], [place] [address]  Report Status PENDING  Incomplete         Radiology Studies: VAS [place] LOWER EXTREMITY VENOUS (DVT)  Result Date: 08/10/2019  Lower Venous DVTStudy Indications: Swelling.  Comparison Study: No prior study Performing Technologist: Maudry Mayhew MHA, RDMS, RVT, RDCS  Examination Guidelines: A complete evaluation includes B-mode imaging, spectral Doppler, color Doppler, and power Doppler as needed of all accessible portions of each vessel. Bilateral testing is considered an integral part of a complete examination. Limited examinations for reoccurring indications may be performed as noted. The reflux portion of the exam is performed with the patient in reverse Trendelenburg.  +---------+---------------+---------+-----------+----------+--------------+ RIGHT    CompressibilityPhasicitySpontaneityPropertiesThrombus Aging +---------+---------------+---------+-----------+----------+--------------+ CFV      Full           Yes      Yes                                 +---------+---------------+---------+-----------+----------+--------------+  SFJ      Full                                                        +---------+---------------+---------+-----------+----------+--------------+ FV Prox  Full                                                        +---------+---------------+---------+-----------+----------+--------------+ FV Mid   Full                                                        +---------+---------------+---------+-----------+----------+--------------+ FV DistalFull                                                        +---------+---------------+---------+-----------+----------+--------------+ PFV      Full                                                        +---------+---------------+---------+-----------+----------+--------------+ POP      None                                                        +---------+---------------+---------+-----------+----------+--------------+ PTV      None                    No                   Acute          +---------+---------------+---------+-----------+----------+--------------+ PERO  None                    No                   Acute          +---------+---------------+---------+-----------+----------+--------------+ Gastroc  None                    No                   Acute          +---------+---------------+---------+-----------+----------+--------------+   +----+---------------+---------+-----------+----------+--------------+ LEFTCompressibilityPhasicitySpontaneityPropertiesThrombus Aging +----+---------------+---------+-----------+----------+--------------+ CFV Full           Yes      Yes                                 +----+---------------+---------+-----------+----------+--------------+     Summary: RIGHT: - Findings consistent with acute deep vein thrombosis involving the right popliteal vein, right posterior tibial veins, right peroneal veins, and right gastrocnemius veins. - No cystic structure found in  the popliteal fossa.  LEFT: - No evidence of common femoral vein obstruction.  *See table(s) above for measurements and observations.    Preliminary         Scheduled Meds: . sodium chloride   Intravenous Once  . Chlorhexidine Gluconate Cloth  6 each Topical Daily  . enoxaparin (LOVENOX) injection  1 mg/kg Subcutaneous Q12H  . feeding supplement (ENSURE ENLIVE)  237 mL Oral TID BM  . free water  200 mL Per Tube Q4H  . insulin aspart  0-6 Units Subcutaneous TID WC  . Ipratropium-Albuterol  1 puff Inhalation BID  . mouth rinse  15 mL Mouth Rinse BID  . methylPREDNISolone (SOLU-MEDROL) injection  60 mg Intravenous Q24H  . multivitamin with minerals  1 tablet Oral Daily  . nicotine  7 mg Transdermal Daily  . pantoprazole (PROTONIX) IV  40 mg Intravenous Q24H  . sodium chloride flush  10-40 mL Intracatheter Q12H  . sodium chloride flush  3 mL Intravenous Once  . sulfamethoxazole-trimethoprim  1 tablet Oral Daily  . thiamine injection  100 mg Intravenous Daily   Continuous Infusions: . feeding supplement (OSMOLITE 1.2 CAL) 1,000 mL (08/10/19 1735)     LOS: 17 days    Time spent: 45 minutes    Irine Seal, MD [facility]   To contact the attending provider between 7A-7P or the covering provider during after hours 7P-7A, please log into the web site [email or web address] and access using [place] password for that web site. If you do not have the password, please call the hospital operator.  08/11/2019, 10:14 AM

## 2019-08-11 NOTE — Code Documentation (Addendum)
PCCM code documentation:  Called to the bedside urgently.  Patient's bradycardia down to heart rate of 30s.  Subsequently heart rate at 0 asystolic arrest.  Patient was given a round of epinephrine CPR was started by nursing staff in room.  Code team came to bedside to assist.  Patient received 4 rounds of epinephrine.  14 minutes of CPR total.  With ROSC.  Patient has known lower extremity DVT involving the right popliteal vein right posterior tibial veins right peroneal vein and right gastrocnemius.  Patient was started on Lovenox yesterday.  Bedside echo with evidence of enlarged right ventricle in lateral view.  Decision made for systemic TPA 50 mg due to patient's weight at 64.4 kg.  This was discussed with pharmacy.  ultrasound images stored on [facility].  Dr. Denese Killings completed bedside echo note to follow  Patient's family updated via conference call.  Josephine Igo, DO [facility] 08/11/2019 6:14 PM

## 2019-08-12 ENCOUNTER — Inpatient Hospital Stay (HOSPITAL_COMMUNITY): Payer: Medicaid Other

## 2019-08-12 DIAGNOSIS — I6340 Cerebral infarction due to embolism of unspecified cerebral artery: Secondary | ICD-10-CM | POA: Insufficient documentation

## 2019-08-12 DIAGNOSIS — E875 Hyperkalemia: Secondary | ICD-10-CM

## 2019-08-12 DIAGNOSIS — K0889 Other specified disorders of teeth and supporting structures: Secondary | ICD-10-CM

## 2019-08-12 DIAGNOSIS — I639 Cerebral infarction, unspecified: Secondary | ICD-10-CM

## 2019-08-12 DIAGNOSIS — I824Z1 Acute embolism and thrombosis of unspecified deep veins of right distal lower extremity: Secondary | ICD-10-CM

## 2019-08-12 LAB — CBC WITH DIFFERENTIAL/PLATELET
Abs Immature Granulocytes: 0.49 10*3/uL — ABNORMAL HIGH (ref 0.00–0.07)
Abs Immature Granulocytes: 1.5 10*3/uL — ABNORMAL HIGH (ref 0.00–0.07)
Basophils Absolute: 0 10*3/uL (ref 0.0–0.1)
Basophils Absolute: 0.2 10*3/uL — ABNORMAL HIGH (ref 0.0–0.1)
Basophils Relative: 0 %
Basophils Relative: 0 %
Eosinophils Absolute: 0 10*3/uL (ref 0.0–0.5)
Eosinophils Absolute: 0 10*3/uL (ref 0.0–0.5)
Eosinophils Relative: 0 %
Eosinophils Relative: 0 %
HCT: 20.5 % — ABNORMAL LOW (ref 39.0–52.0)
HCT: 29.2 % — ABNORMAL LOW (ref 39.0–52.0)
Hemoglobin: 6.9 g/dL — CL (ref 13.0–17.0)
Hemoglobin: 9.3 g/dL — ABNORMAL LOW (ref 13.0–17.0)
Immature Granulocytes: 2 %
Immature Granulocytes: 3 %
Lymphocytes Relative: 7 %
Lymphocytes Relative: 3 %
Lymphs Abs: 1.5 10*3/uL (ref 0.7–4.0)
Lymphs Abs: 1.9 10*3/uL (ref 0.7–4.0)
MCH: 31.2 pg (ref 26.0–34.0)
MCH: 31.5 pg (ref 26.0–34.0)
MCHC: 33.7 g/dL (ref 30.0–36.0)
MCHC: 31.8 g/dL (ref 30.0–36.0)
MCV: 92.8 fL (ref 80.0–100.0)
MCV: 99 fL (ref 80.0–100.0)
Monocytes Absolute: 1.8 10*3/uL — ABNORMAL HIGH (ref 0.1–1.0)
Monocytes Absolute: 5.7 10*3/uL — ABNORMAL HIGH (ref 0.1–1.0)
Monocytes Relative: 8 %
Monocytes Relative: 9 %
Neutro Abs: 17.5 10*3/uL — ABNORMAL HIGH (ref 1.7–7.7)
Neutro Abs: 51.3 10*3/uL — ABNORMAL HIGH (ref 1.7–7.7)
Neutrophils Relative %: 83 %
Neutrophils Relative %: 85 %
Platelets: 187 10*3/uL (ref 150–400)
Platelets: 201 10*3/uL (ref 150–400)
RBC: 2.21 MIL/uL — ABNORMAL LOW (ref 4.22–5.81)
RBC: 2.95 MIL/uL — ABNORMAL LOW (ref 4.22–5.81)
RDW: 13.3 % (ref 11.5–15.5)
RDW: 15.4 % (ref 11.5–15.5)
WBC: 21.2 10*3/uL — ABNORMAL HIGH (ref 4.0–10.5)
WBC: 60.5 10*3/uL (ref 4.0–10.5)
nRBC: 0.1 % (ref 0.0–0.2)
nRBC: 0.2 % (ref 0.0–0.2)

## 2019-08-12 LAB — BLOOD GAS, VENOUS
Acid-base deficit: 1.3 mmol/L (ref 0.0–2.0)
Bicarbonate: 26 mmol/L (ref 20.0–28.0)
Drawn by: 3468
FIO2: 100
O2 Saturation: 98.3 %
Patient temperature: 37.1
pCO2, Ven: 71.8 mmHg (ref 44.0–60.0)
pH, Ven: 7.185 — CL (ref 7.250–7.430)
pO2, Ven: 142 mmHg — ABNORMAL HIGH (ref 32.0–45.0)

## 2019-08-12 LAB — COMPREHENSIVE METABOLIC PANEL
ALT: 53 U/L — ABNORMAL HIGH (ref 0–44)
AST: 60 U/L — ABNORMAL HIGH (ref 15–41)
Albumin: 1.8 g/dL — ABNORMAL LOW (ref 3.5–5.0)
Alkaline Phosphatase: 95 U/L (ref 38–126)
Anion gap: 12 (ref 5–15)
BUN: 39 mg/dL — ABNORMAL HIGH (ref 6–20)
CO2: 22 mmol/L (ref 22–32)
Calcium: 8.6 mg/dL — ABNORMAL LOW (ref 8.9–10.3)
Chloride: 108 mmol/L (ref 98–111)
Creatinine, Ser: 1.17 mg/dL (ref 0.61–1.24)
GFR calc Af Amer: >60 mL/min (ref >60)
GFR calc non Af Amer: >60 mL/min (ref >60)
Glucose, Bld: 117 mg/dL — ABNORMAL HIGH (ref 70–99)
Potassium: 6.7 mmol/L (ref 3.5–5.1)
Sodium: 142 mmol/L (ref 135–145)
Total Bilirubin: 0.7 mg/dL (ref 0.3–1.2)
Total Protein: 6.3 g/dL — ABNORMAL LOW (ref 6.5–8.1)

## 2019-08-12 LAB — BASIC METABOLIC PANEL
Anion gap: 7 (ref 5–15)
BUN: 42 mg/dL — ABNORMAL HIGH (ref 6–20)
CO2: 27 mmol/L (ref 22–32)
Calcium: 8.7 mg/dL — ABNORMAL LOW (ref 8.9–10.3)
Chloride: 108 mmol/L (ref 98–111)
Creatinine, Ser: 1.26 mg/dL — ABNORMAL HIGH (ref 0.61–1.24)
GFR calc Af Amer: >60 mL/min (ref >60)
GFR calc non Af Amer: >60 mL/min (ref >60)
Glucose, Bld: 118 mg/dL — ABNORMAL HIGH (ref 70–99)
Potassium: 6.5 mmol/L (ref 3.5–5.1)
Sodium: 142 mmol/L (ref 135–145)

## 2019-08-12 LAB — GLUCOSE, CAPILLARY
Glucose-Capillary: 115 mg/dL — ABNORMAL HIGH (ref 70–99)
Glucose-Capillary: 102 mg/dL — ABNORMAL HIGH (ref 70–99)
Glucose-Capillary: 122 mg/dL — ABNORMAL HIGH (ref 70–99)
Glucose-Capillary: 199 mg/dL — ABNORMAL HIGH (ref 70–99)
Glucose-Capillary: 169 mg/dL — ABNORMAL HIGH (ref 70–99)

## 2019-08-12 LAB — POTASSIUM
Potassium: 6.3 mmol/L (ref 3.5–5.1)
Potassium: 5.8 mmol/L — ABNORMAL HIGH (ref 3.5–5.1)
Potassium: 5.9 mmol/L — ABNORMAL HIGH (ref 3.5–5.1)
Potassium: 5.1 mmol/L (ref 3.5–5.1)

## 2019-08-12 LAB — HEPARIN LEVEL (UNFRACTIONATED)
Heparin Unfractionated: 0.26 IU/mL — ABNORMAL LOW (ref 0.30–0.70)
Heparin Unfractionated: 0.29 IU/mL — ABNORMAL LOW (ref 0.30–0.70)

## 2019-08-12 LAB — NA AND K (SODIUM & POTASSIUM), RAND UR
Potassium Urine: 26 mmol/L
Sodium, Ur: 73 mmol/L

## 2019-08-12 LAB — MAGNESIUM: Magnesium: 2.1 mg/dL (ref 1.7–2.4)

## 2019-08-12 MED ORDER — SODIUM CHLORIDE 0.9 % IV SOLN
2.0000 g | INTRAVENOUS | Status: DC
  Administered 2019-08-12: 14:00:00 2 g via INTRAVENOUS
  Filled 2019-08-12: qty 2

## 2019-08-12 MED ORDER — LABETALOL HCL 5 MG/ML IV SOLN: 5.0000 mg | Freq: Four times a day (QID) | INTRAVENOUS | Status: DC | PRN

## 2019-08-12 MED ORDER — SODIUM ZIRCONIUM CYCLOSILICATE 10 G PO PACK
10.0000 g | PACK | Freq: Two times a day (BID) | ORAL | Status: AC
  Administered 2019-08-12: 16:00:00 10 g via ORAL
  Filled 2019-08-12: qty 1

## 2019-08-12 MED ORDER — SULFAMETHOXAZOLE-TRIMETHOPRIM 400-80 MG/5ML IV SOLN
15.0000 mg/kg/d | Freq: Three times a day (TID) | INTRAVENOUS | Status: DC
  Administered 2019-08-12 – 2019-08-14 (×6): 322.08 mg via INTRAVENOUS
  Filled 2019-08-12 (×8): qty 20.13

## 2019-08-12 MED ORDER — SODIUM ZIRCONIUM CYCLOSILICATE 10 G PO PACK
10.0000 g | PACK | Freq: Two times a day (BID) | ORAL | Status: DC
  Administered 2019-08-12: 10:00:00 10 g via ORAL
  Filled 2019-08-12 (×2): qty 1

## 2019-08-12 MED ORDER — STROKE: EARLY STAGES OF RECOVERY BOOK
Freq: Once | Status: AC
  Administered 2019-08-12: 07:00:00 1
  Filled 2019-08-12: qty 1

## 2019-08-12 MED ORDER — ADULT MULTIVITAMIN LIQUID CH
15.0000 mL | Freq: Every day | ORAL | Status: DC
  Administered 2019-08-12 – 2019-08-17 (×6): 15 mL
  Filled 2019-08-12 (×6): qty 15

## 2019-08-12 MED ORDER — SULFAMETHOXAZOLE-TRIMETHOPRIM 800-160 MG PO TABS
1.0000 | ORAL_TABLET | Freq: Every day | ORAL | Status: DC
  Filled 2019-08-12: qty 1

## 2019-08-12 MED ORDER — INSULIN ASPART 100 UNIT/ML ~~LOC~~ SOLN
0.0000 [IU] | SUBCUTANEOUS | Status: DC
  Administered 2019-08-12 – 2019-08-13 (×3): 1 [IU] via SUBCUTANEOUS
  Administered 2019-08-13: 2 [IU] via SUBCUTANEOUS
  Administered 2019-08-13 – 2019-08-15 (×8): 1 [IU] via SUBCUTANEOUS

## 2019-08-12 MED ORDER — INSULIN ASPART 100 UNIT/ML IV SOLN
5.0000 [IU] | Freq: Once | INTRAVENOUS | Status: AC
  Administered 2019-08-12: 5 [IU] via INTRAVENOUS

## 2019-08-12 MED ORDER — LABETALOL HCL 5 MG/ML IV SOLN: 10.0000 mg | INTRAVENOUS | Status: DC | PRN

## 2019-08-12 MED ORDER — INSULIN ASPART 100 UNIT/ML ~~LOC~~ SOLN
10.0000 [IU] | Freq: Once | SUBCUTANEOUS | Status: AC
  Administered 2019-08-12: 10 [IU] via SUBCUTANEOUS

## 2019-08-12 MED ORDER — SODIUM CHLORIDE 0.9 % IV SOLN
100.0000 mg | INTRAVENOUS | Status: DC
  Filled 2019-08-12: qty 100

## 2019-08-12 MED ORDER — SODIUM CHLORIDE 0.9 % IV SOLN
3.0000 g | Freq: Four times a day (QID) | INTRAVENOUS | Status: DC
  Administered 2019-08-12 – 2019-08-17 (×20): 3 g via INTRAVENOUS
  Filled 2019-08-12 (×4): qty 3
  Filled 2019-08-12: qty 8
  Filled 2019-08-12: qty 3
  Filled 2019-08-12: qty 8
  Filled 2019-08-12 (×5): qty 3
  Filled 2019-08-12: qty 8
  Filled 2019-08-12 (×2): qty 3
  Filled 2019-08-12: qty 8
  Filled 2019-08-12 (×2): qty 3
  Filled 2019-08-12: qty 8
  Filled 2019-08-12: qty 3
  Filled 2019-08-12 (×2): qty 8
  Filled 2019-08-12 (×3): qty 3

## 2019-08-12 MED ORDER — IOHEXOL 350 MG/ML SOLN
50.0000 mL | Freq: Once | INTRAVENOUS | Status: AC | PRN
  Administered 2019-08-12: 50 mL via INTRAVENOUS

## 2019-08-12 MED ORDER — DEXTROSE 50 % IV SOLN
1.0000 | Freq: Once | INTRAVENOUS | Status: AC
  Administered 2019-08-12: 16:00:00 50 mL via INTRAVENOUS
  Filled 2019-08-12: qty 50

## 2019-08-12 MED ORDER — SODIUM BICARBONATE 8.4 % IV SOLN
50.0000 meq | Freq: Once | INTRAVENOUS | Status: AC
  Administered 2019-08-12: 50 meq via INTRAVENOUS
  Filled 2019-08-12: qty 50

## 2019-08-12 MED ORDER — SODIUM CHLORIDE 0.9 % IV SOLN: 50.0000 mg | INTRAVENOUS | Status: DC

## 2019-08-12 MED ORDER — DEXTROSE 50 % IV SOLN
1.0000 | Freq: Once | INTRAVENOUS | Status: AC
  Administered 2019-08-12: 50 mL via INTRAVENOUS
  Filled 2019-08-12: qty 50

## 2019-08-12 MED ORDER — SODIUM POLYSTYRENE SULFONATE 15 GM/60ML PO SUSP
30.0000 g | Freq: Once | ORAL | Status: AC
  Administered 2019-08-12: 30 g via ORAL
  Filled 2019-08-12: qty 120

## 2019-08-12 MED ORDER — ADULT MULTIVITAMIN W/MINERALS CH
1.0000 | ORAL_TABLET | Freq: Every day | ORAL | Status: DC
  Filled 2019-08-12: qty 1

## 2019-08-12 MED ORDER — SODIUM CHLORIDE 0.9% FLUSH: 10.0000 mL | INTRAVENOUS | Status: DC | PRN

## 2019-08-12 MED ORDER — SODIUM CHLORIDE 0.9% FLUSH: 10.0000 mL | Freq: Two times a day (BID) | INTRAVENOUS | Status: DC

## 2019-08-12 MED ORDER — SODIUM CHLORIDE (PF) 0.9 % IJ SOLN: 10.0000 [IU] | Freq: Once | INTRAMUSCULAR | Status: DC

## 2019-08-12 MED ORDER — SODIUM CHLORIDE 0.9 % IV SOLN
200.0000 mg | Freq: Once | INTRAVENOUS | Status: AC
  Administered 2019-08-12: 200 mg via INTRAVENOUS
  Filled 2019-08-12: qty 200

## 2019-08-12 NOTE — Progress Notes (Signed)
CRITICAL VALUE ALERT  Critical Value:  Potassium 5.9  Date & Time Notied:  08/12/2019  3:28 PM  Provider Notified: MD Icard   Orders Received/Actions taken: new orders

## 2019-08-12 NOTE — Progress Notes (Signed)
ANTICOAGULATION CONSULT NOTE   Pharmacy Consult for heparin Indication: DVT , R/o PE No Known Allergies  Patient Measurements: Height: 6\' 1"  (185.4 cm) Weight: 141 lb 15.6 oz (64.4 kg) IBW/kg (Calculated) : 79.9 Heparin Dosing Weight: 64kg  Vital Signs: Temp: 98.1 F (36.7 C) (03/07 1900) Temp Source: Axillary (03/07 1900) BP: 114/42 (03/07 2045) Pulse Rate: 65 (03/07 2045)  Labs: Recent Labs    08/11/19 0345 08/11/19 1313 08/11/19 1329 08/11/19 1329 08/11/19 2036 08/12/19 0318 08/12/19 0518 08/12/19 0701 08/12/19 2013  HGB 6.9*   < > 8.8*   < > 9.0* 9.3*  --   --   --   HCT 20.5*   < > 27.2*  --  28.0* 29.2*  --   --   --   PLT 187  --   --   --  195 201  --   --   --   APTT  --   --   --   --  30  --   --   --   --   LABPROT  --   --   --   --  17.7*  --   --   --   --   INR  --   --   --   --  1.5*  --   --   --   --   HEPARINUNFRC  --   --   --   --   --   --   --  0.26* 0.29*  CREATININE 0.76  --   --   --   --  1.17 1.26*  --   --    < > = values in this interval not displayed.    Estimated Creatinine Clearance: 58.9 mL/min (A) (by C-G formula based on SCr of 1.26 mg/dL (H)).   Medical History: Past Medical History:  Diagnosis Date  . Hypertension    Assessment: 59 year old male with newly diagnosed DVT 3/5 placed on full dose lovenox 3/5 now s/p cardiac arrest with tPA administered. Pharmacy asked to transition to IV heparin. CT head with acute infarct, per neuro ok to keep IV heparin for now.  Follow up heparin level subtherapeutic at 0.29. No bleeding issues noted.   Goal of Therapy:  Heparin level 0.3-0.5 units/ml Monitor platelets by anticoagulation protocol: Yes   Plan:  -Increase heparin to 1200 units/h -Recheck heparin level in am -Will likely be able to extend to full heparin range in am, will be >24h from tpa administration  59 PharmD., BCPS Clinical Pharmacist 08/12/2019 9:06 PM

## 2019-08-12 NOTE — Progress Notes (Addendum)
Pharmacy Antibiotic Note  Charles Walters is a 59 y.o. male with worsening PNA.  Pharmacy has been consulted for Bactrim dosing with concern for PCP. Ceftriaxone and Eraxis have been added as well. Pt has had prolonged admit with multiple courses of ABX. Bactrim ppx started yesterday but now to transition to full dose. WBC 60, Cr up to 1.26 s/p code yesterday, hyperkalemic this morning (improving). BAL cultures pending.  Plan: SMZ-TMP 84m/kg/day divided q8h Watch Cr, K closely  Height: _0  (185.4 cm) Weight: 141 lb 15.6 oz (64.4 kg) IBW/kg (Calculated) : 79.9  Temp (24hrs), Avg:98.8 F (37.1 C), Min:98.6 F (37 C), Max:99.2 F (37.3 C)  Recent Labs  Lab 08/05/19 1614 08/06/19 0123 08/09/19 0158 08/10/19 0556 08/11/19 0345 08/11/19 2036 08/12/19 0318 08/12/19 0518  WBC  --    < > 25.5* 22.0* 21.2* 48.4* 60.5*  --   CREATININE  --    < > 0.82 0.72 0.76  --  1.17 1.26*  VANCOTROUGH 8*  --   --   --   --   --   --   --    < > = values in this interval not displayed.    Estimated Creatinine Clearance: 58.9 mL/min (A) (by C-G formula based on SCr of 1.26 mg/dL (H)).    No Known Allergies  Antimicrobials this admission: Azithromycin 2/17 x1; 3/2 x1 Ceftriaxone 2/17 x1; Cefepime 2/18 >> 2/20; 2/25 >> 3/2 Vancomycin 2/18 >> 2/19; 2/22 >> 2/24; 2/26 >>3/2 Levofloxacin 2/20 >> 2/24 SMZ-TMP 3/7 >>  Microbiology results: 2/18 MRSA PCR: negative 2/18 Resp PCR: negative 2/22 BCx: NGTD 2/25 BCx: NGTD 3/2 MRSA PCR: negative 3/3 BCx: NGTD 3/6 BAL: pending  MArrie Senate PharmD, BCPS Clinical Pharmacist [phone] check AMION for all [place]numbers 08/12/2019

## 2019-08-12 NOTE — Progress Notes (Signed)
ANTICOAGULATION CONSULT NOTE - Initial Consult  Pharmacy Consult for lovenox>>heparin Indication: DVT , R/o PE No Known Allergies  Patient Measurements: Height: 6\' 1"  (185.4 cm) Weight: 141 lb 15.6 oz (64.4 kg) IBW/kg (Calculated) : 79.9 Heparin Dosing Weight: 64kg  Vital Signs: Temp: 99 F (37.2 C) (03/07 0800) Temp Source: Oral (03/07 0800) BP: 94/68 (03/07 0845) Pulse Rate: 131 (03/07 0845)  Labs: Recent Labs    08/11/19 0345 08/11/19 1313 08/11/19 1329 08/11/19 1329 08/11/19 2036 08/12/19 0318 08/12/19 0518 08/12/19 0701  HGB 6.9*   < > 8.8*   < > 9.0* 9.3*  --   --   HCT 20.5*   < > 27.2*  --  28.0* 29.2*  --   --   PLT 187  --   --   --  195 201  --   --   APTT  --   --   --   --  30  --   --   --   LABPROT  --   --   --   --  17.7*  --   --   --   INR  --   --   --   --  1.5*  --   --   --   HEPARINUNFRC  --   --   --   --   --   --   --  0.26*  CREATININE 0.76  --   --   --   --  1.17 1.26*  --    < > = values in this interval not displayed.    Estimated Creatinine Clearance: 58.9 mL/min (A) (by C-G formula based on SCr of 1.26 mg/dL (H)).   Medical History: Past Medical History:  Diagnosis Date  . Hypertension    Assessment: 59 year old male with newly diagnosed DVT 3/5 placed on full dose lovenox 3/5 now s/p cardiac arrest with tPA administered. Pharmacy asked to transition to IV heparin. CT head with acute infarct, per neuro ok to keep IV heparin for now.  Initial heparin level subtherapeutic at 0.26.  Goal of Therapy:  Heparin level 0.3-0.5 units/ml Monitor platelets by anticoagulation protocol: Yes   Plan:  -Increase heparin to 1100 units/h -Recheck heparin level in 6hr -Continue 0.3-0.5 goal with no bolus after 24h tPA window given infarct   59, PharmD, BCPS Clinical Pharmacist [phone] Please check AMION for all [facility] Pharmacy numbers 08/12/2019

## 2019-08-12 NOTE — Progress Notes (Signed)
PT Cancellation Note  Patient Details Name: Charles Walters MRN: [id number] DOB: May 06, 1961   Cancelled Treatment:    Reason Eval/Treat Not Completed: Medical issues which prohibited therapy;Patient not medically ready. Pt coded yesterday requiring 14 minutes of CPR. TPA given due to concern for PE. CT revealed large right MCA territory stroke. Pt is now intubated and unresponsive. New PT order acknowledged. Will follow and proceed with PT eval when medically ready.   Ilda Foil 08/12/2019, 8:52 AM   Aida Raider, PT  Office # [phone] Pager [phone]

## 2019-08-12 NOTE — Progress Notes (Signed)
150 mL of Fentanyl wasted in steri-cycle witnessed by Marcelle Overlie.

## 2019-08-12 NOTE — Progress Notes (Signed)
PULMONARY / CRITICAL CARE MEDICINE   NAME:  Charles Walters, MRN:  [id number], DOB:  05-10-1961, LOS: 18 ADMISSION DATE:  08/04/2019, CONSULTATION DATE:  07/30/2019 REFERRING MD:  Janee Morn, CHIEF COMPLAINT:  Hypoxemic respiratory failure  BRIEF HISTORY:    The patient is a 59 year old gentleman who presented 07/19/2019 with hiccups, loss of appetite, hyponatremia, watery diarrhea.  He was also short of breath on exertion.  He had a grossly abnormal chest x-ray and was found to be incidentally hypoxemic.  He was seen initially by pulmonary critical care team and treated empirically for community-acquired pneumonia.  However over the last week he has continued to decline, had worsening hypoxemia, and had a repeat CT chest which shows persistent bilateral lower lobe predominant groundglass opacities with interlobular septal thickening.  Is being called back to evaluate these opacities in the setting of hypoxemic respiratory failure.  SIGNIFICANT PAST MEDICAL HISTORY   Hypertension  SIGNIFICANT EVENTS:  2/22: PCCM consulted and recommended for CAP coverage 3/1: PCCM consulted for persistent infiltrates and hypoxemia, planned for bronch AM of 3/2 for which transferred to ICU; bronch cancelled for worsening O2 requirements   STUDIES:   2/17: CT Chest wo Contrast: multifocal pneumonia, viral vs atypical; mild aneurysmal dilation of ascending aorta up to 4.2cm  2/18: CT Angio Chest: No PE; nonspecific multifocal pneumonia of atypical etiology 3/1: CT Chest wo Contrast: extensive worsening crazy paving pattern (ground glass opacity + interlobular septal thickening) throughout peripheral lungs bilaterally involving all lung lobes; new patchy regions of consolidative airspace disease in lungs bilaterally; most prominent in lower lobes. Given interval worsening, considerations include acute interstitial pneumonia/ARDS, atypical infection or pulmonary alveolar proteinosis; small dependent bilateral pleural effusions;  dilated main pulmonary artery suggesting pulmonary arterial hypertension; stable ascending thoracic aorta aneurysm.   CULTURES:  Cultures no growth to date Respiratory virus panel negative MRSA PCR negative Covid negative 3/3: Blood cultures x2> neg to date 3/3: Urine legionella>  3/3: Urine strep>  3/6: Bronchoscopy cultures pending, AFB and fungus.  ANTIBIOTICS:  Vancomycin 2/22> 3/2 Cefepime 2/25> 3/2 Bactrim 3/2 Azithromycin 3/2  CONSULTANTS:  PCCM  SUBJECTIVE:   Patient evaluated by neurology overnight.  CT scan of the head with new MCA stroke.  CONSTITUTIONAL: BP 94/68   Pulse (!) 131   Temp 99 F (37.2 C) (Oral)   Resp (!) 24   Ht 6\' 1"  (1.854 m)   Wt 64.4 kg   SpO2 97%   BMI 18.73 kg/m   I/O last 3 completed shifts: In: 3626.8 [I.V.:622.5; Blood:318.8; NG/GT:1522.5; IV Piggyback:1163.1] Out: 1535 [Urine:1535]  Vent Mode: PRVC FiO2 (%):  [80 %-100 %] 80 % Set Rate:  [18 bmp-22 bmp] 22 bmp Vt Set:  [480 mL] 480 mL PEEP:  [10 cmH20] 10 cmH20  CBC Latest Ref Rng & Units 08/12/2019 08/11/2019 08/11/2019  WBC 4.0 - 10.5 K/uL 60.5(HH) 48.4(H) -  Hemoglobin 13.0 - 17.0 g/dL 10/11/2019) 6.0(V) 3.7(T)  Hematocrit 39.0 - 52.0 % 29.2(L) 28.0(L) 27.2(L)  Platelets 150 - 400 K/uL 201 195 -   CMP Latest Ref Rng & Units 08/12/2019 08/12/2019 08/12/2019  Glucose 70 - 99 mg/dL - 10/12/2019) 694(W)  BUN 6 - 20 mg/dL - 546(E) 70(J)  Creatinine 0.61 - 1.24 mg/dL - 50(K) 9.38(H  Sodium 135 - 145 mmol/L - 142 142  Potassium 3.5 - 5.1 mmol/L 6.3(HH) 6.5(HH) 6.7(HH)  Chloride 98 - 111 mmol/L - 108 108  CO2 22 - 32 mmol/L - 27 22  Calcium 8.9 - 10.3 mg/dL -  8.7(L) 8.6(L)  Total Protein 6.5 - 8.1 g/dL - - 6.3(L)  Total Bilirubin 0.3 - 1.2 mg/dL - - 0.7  Alkaline Phos 38 - 126 U/L - - 95  AST 15 - 41 U/L - - 60(H)  ALT 0 - 44 U/L - - 53(H)   PHYSICAL EXAM: General: Elderly male intubated on mechanical life support Neuro: Off sedation, not following any commands for me.  Withdraws to pain  on the right, nothing on the left HEENT: NCAT Cardiovascular: Tachycardic, S1-S2 regular rhythm Lungs: Bilateral ventilated breath sounds Abdomen: Soft, nontender nondistended Musculoskeletal: No significant edema Skin: No rash  RESOLVED PROBLEM LIST   ASSESSMENT AND PLAN    Asystolic cardiac arrest, PEA cardiac arrest -Patient given empiric TPA due to right lower extremity DVT and PEA arrest in the ICU with concern of PE. New Acute right MCA stroke -Continue to observe for neuro recovery -CT with early brain swelling -Appreciate neurology input. -Serial CTs ordered, should be able to go for MRI soon and will wean off INO -Per neurology okay to keep continuous heparin  Shock state-hypotension -Multifactorial, septic versus cardiogenic -Repeat echo limited bubble study, question PFO with history of DVT and new MCA stroke -Also eval ejection fraction following arrest  Acute hypoxemic hypercarbic respiratory failure  Acute interstitial pneumonia versus alveolar hemorrhage. Differential diagnosis follows that of "crazy paving" Areas of groundglass with interlobular septal thickening. -Intubated on mechanical life support -Adult ventilator management protocol -Continue IV steroids at this time -Unclear etiology of his parenchymal disease at this time.Cultures everything pending.  Bronchoscopy cell count differential reviewed.  Hyperkalemia, AKI -Treated with insulin D50 calcium and Kayexalate -Repeat pending  Acute metabolic encephalopathy likely 2/2 delirium and respiratory failure Continue to monitor  Severe protein calorie malnutrition Patient pulled core track last night. After intubation we will plan for OG tube placement restart tube feeds  Leukocytosis w/bandemia: Continue to observe -Now with significant leukocytosis  Acute right lower extremity DVT -On heparin  Best Practice / Goals of Care / Disposition.   Diet: regular diet; will need tube feeds given  inadequate PO intake Pain/Anxiety/Delirium protocol (if indicated): n/a VAP protocol (if indicated):n/a DVT prophylaxis: lovenox GI prophylaxis: Protonix Glucose control: monitor CBG Foley condom cath Mobility: bed rest Code Status: Full Family Communication: I updated patient's daughter via phone Disposition: ICU  LABS  Glucose Recent Labs  Lab 08/11/19 1609 08/11/19 2002 08/11/19 2330 08/12/19 0352 08/12/19 0354 08/12/19 0824  GLUCAP 100* 153* 89 40* 115* 102*    BMET Recent Labs  Lab 08/11/19 0345 08/11/19 0345 08/11/19 1313 08/11/19 1313 08/12/19 0318 08/12/19 0518 08/12/19 0701  NA 139   < > 142  --  142 142  --   K 4.6   < > 4.6   < > 6.7* 6.5* 6.3*  CL 107  --   --   --  108 108  --   CO2 24  --   --   --  22 27  --   BUN 33*  --   --   --  39* 42*  --   CREATININE 0.76  --   --   --  1.17 1.26*  --   GLUCOSE 112*  --   --   --  117* 118*  --    < > = values in this interval not displayed.    Liver Enzymes Recent Labs  Lab 08/10/19 0556 08/11/19 0345 08/12/19 0318  AST 34 37 60*  ALT 27  34 53*  ALKPHOS 75 78 95  BILITOT 0.6 0.5 0.7  ALBUMIN 1.6* 1.7* 1.8*    Electrolytes Recent Labs  Lab 08/10/19 0556 08/10/19 1700 08/11/19 0345 08/11/19 2036 08/12/19 0318 08/12/19 0518  CALCIUM   < >  --  8.8*  --  8.6* 8.7*  MG   < > 1.9 1.9 2.0 2.1  --   PHOS  --  2.6 3.3 7.1*  --   --    < > = values in this interval not displayed.    CBC Recent Labs  Lab 08/11/19 0345 08/11/19 1313 08/11/19 1329 08/11/19 2036 08/12/19 0318  WBC 21.2*  --   --  48.4* 60.5*  HGB 6.9*   < > 8.8* 9.0* 9.3*  HCT 20.5*   < > 27.2* 28.0* 29.2*  PLT 187  --   --  195 201   < > = values in this interval not displayed.    ABG Recent Labs  Lab 08/09/19 1108 08/10/19 0419 08/11/19 1313  PHART 7.489* 7.417 7.185*  PCO2ART 35.4 42.4 75.9*  PO2ART 90.0 189.0* 229.0*    Coag's Recent Labs  Lab 08/11/19 2036  APTT 30  INR 1.5*    Sepsis  Markers Recent Labs  Lab 08/08/19 0248  PROCALCITON 3.64    Cardiac Enzymes No results for input(s): TROPONINI, PROBNP in the last 168 hours.   This patient is critically ill with multiple organ system failure; which, requires frequent high complexity decision making, assessment, support, evaluation, and titration of therapies. This was completed through the application of advanced monitoring technologies and extensive interpretation of multiple databases. During this encounter critical care time was devoted to patient care services described in this note for 34 minutes.  Garner Nash, DO [facility] 08/12/2019 9:11 AM

## 2019-08-12 NOTE — Procedures (Signed)
Patient Name: Kijuan Gallicchio  MRN: [id number]  Epilepsy Attending: Charlsie Quest  Referring Physician/Provider: Dr Delia Heady Date: 08/12/2019 Duration: 29.04 mins  Patient history: 58yo M admitted for respiratory distress, had cardiorespiratory arrest. EEG to evaluate for seizure.  Level of alertness: comatose  AEDs during EEG study: None  Technical aspects: This EEG study was done with scalp electrodes positioned according to the 10-20 International system of electrode placement. Electrical activity was acquired at a sampling rate of 500Hz  and reviewed with a high frequency filter of 70Hz  and a low frequency filter of 1Hz . EEG data were recorded continuously and digitally stored.   DESCRIPTION:  EEG showed continuous generalized low amplitude 2-3hz  delta slowing. EEG was not reactive to noxious stimulation. Hyperventilation and photic stimulation were not performed.  ABNORMALITY - Continuous slow, generalized  IMPRESSION: This study is suggestive of profound diffuse encephalopathy, non specific to etiology. No seizures or epileptiform discharges were seen throughout the recording.    Bobby Barton   Vern Guerette 

## 2019-08-12 NOTE — Progress Notes (Signed)
Pt is in sterile procedure. EEG to try back when schedule permits.

## 2019-08-12 NOTE — Progress Notes (Signed)
STROKE TEAM PROGRESS NOTE   HISTORY OF PRESENT ILLNESS (per record) Charles Walters is a 59 y.o. male admitted initially on February 17 with intractable hiccups, loss of appetite and diarrhea.  At that time, he was complaining of some shortness of breath with exertion but not at rest.  He was found to have a sodium of 114 and chest x-ray concerning for fibrosis or atypical pneumonia.  He had persistent problems with his respiratory system.  On March 2, he had worsening respiratory failure and was transferred to the ICU for high flow nasal cannula but not intubated.  At that time he was alert and oriented and remained that way until 3/5.  At that time was noted that he was getting agitated, and had worsening blood loss.  Yesterday he was intubated so that a bronchoscopy could be performed.  During that day he has noted to be alert and oriented.  Around 5 PM yesterday, he had a CODE BLUE.  He was initially bradycardic and subsequently asystolic.  He had 4 rounds of epinephrine 14 minutes of CPR performed. Due to concern for PE he was given IV TPA.  Following this, he continued to have some problems with hypotension. It was noted that he was having some problems with his mental status and therefore a CT scan was performed which demonstrates a rather large right MCA territory stroke LKW: Prior to code at 5 PM yesterday tpa given?:  Yes but for pulmonary reasons   INTERVAL HISTORY I have personally reviewed history of presenting illness, electronic medical records and imaging films in PACS.  I discussed the case with Dr. Tonia Brooms and patient's bedside RN.  Patient remains unresponsive and comatose.  He is on ventilatory support for respiratory failure with appears to be in distress.  He is comatose unresponsive not following commands and not having any purposeful movements in the extremities.  MRI is pending.  EEG is pending    OBJECTIVE Vitals:   08/12/19 0815 08/12/19 0830 08/12/19 0845 08/12/19 0900  BP:  (!) 123/96 115/79 94/68   Pulse: (!) 127 (!) 133 (!) 131   Resp: (!) 26 (!) 25 (!) 24   Temp:      TempSrc:      SpO2: 100% 100% 97% 98%  Weight:      Height:        CBC:  Recent Labs  Lab 08/11/19 0345 08/11/19 1313 08/11/19 2036 08/12/19 0318  WBC 21.2*  --  48.4* 60.5*  NEUTROABS 17.5*  --   --  51.3*  HGB 6.9*   < > 9.0* 9.3*  HCT 20.5*   < > 28.0* 29.2*  MCV 92.8  --  96.6 99.0  PLT 187  --  195 201   < > = values in this interval not displayed.    Basic Metabolic Panel:  Recent Labs  Lab 08/11/19 0345 08/11/19 0345 08/11/19 1313 08/11/19 2036 08/12/19 0318 08/12/19 0318 08/12/19 0518 08/12/19 0701  NA 139   < >   < >  --  142  --  142  --   K 4.6   < >   < >  --  6.7*   < > 6.5* 6.3*  CL 107   < >  --   --  108  --  108  --   CO2 24   < >  --   --  22  --  27  --   GLUCOSE 112*   < >  --   --  117*  --  118*  --   BUN 33*   < >  --   --  39*  --  42*  --   CREATININE 0.76   < >  --   --  1.17  --  1.26*  --   CALCIUM 8.8*   < >  --   --  8.6*  --  8.7*  --   MG 1.9  --   --  2.0 2.1  --   --   --   PHOS 3.3  --   --  7.1*  --   --   --   --    < > = values in this interval not displayed.    Lipid Panel: No results found for: CHOL, TRIG, HDL, CHOLHDL, VLDL, LDLCALC HgbA1c:  Lab Results  Component Value Date   HGBA1C 6.0 (H) 08/09/2019   Urine Drug Screen:     Component Value Date/Time   LABOPIA NONE DETECTED 08/11/2019 1328   COCAINSCRNUR NONE DETECTED 08/11/2019 1328   LABBENZ NONE DETECTED 08/11/2019 1328   AMPHETMU NONE DETECTED 08/11/2019 1328   THCU POSITIVE (A) 08/11/2019 1328   LABBARB NONE DETECTED 08/11/2019 1328    Alcohol Level     Component Value Date/Time   ETH <10 07/31/2019 1633    IMAGING  CT HEAD WO CONTRAST 08/12/2019 IMPRESSION:  Acute infarction in the right middle cerebral artery territory, with sparing of the basal ganglia. Early brain swelling and low density. No evidence of hemorrhage or mass effect at this time.  Background pattern of chronic small vessel ischemic changes elsewhere throughout the brain.   DG CHEST PORT 1 VIEW 08/11/2019 IMPRESSION:  No interval change.  No pneumothorax.   DG CHEST PORT 1 VIEW 08/11/2019 IMPRESSION:  1. Mildly progressive bilateral pneumonia.  2. Interval mild cardiomegaly.  3. Endotracheal tube and nasogastric tube in satisfactory position.   CT Chest WO Contrast 08/06/2019 IMPRESSION: 1. Extensive worsening crazy paving pattern (ground-glass opacity + interlobular septal thickening) throughout the peripheral lungs bilaterally involving all lung lobes. New patchy regions of consolidative airspace disease in the lungs bilaterally, most prominent in the lower lobes. Given the interval worsening, considerations include acute interstitial pneumonia (Hamman-Rich syndrome)/ARDS, atypical infection or pulmonary alveolar proteinosis. Pulmonology consultation suggested. 2. Small dependent bilateral pleural effusions. 3. Dilated main pulmonary artery, suggesting pulmonary arterial hypertension. 4. 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 Ascending thoracic aortic aneurysm. Recommend semi-annual imaging followup by CTA or MRA and referral to cardiothoracic surgery if not already obtained. Aortic aneurysm NOS (ICD10-I71.9). 5. Three-vessel coronary atherosclerosis. 6. Aortic Atherosclerosis (ICD10-I70.0).  VAS [place] LOWER EXTREMITY VENOUS (DVT) 08/11/2019 Summary:  RIGHT: - Findings consistent with acute deep vein thrombosis involving the right popliteal vein, right posterior tibial veins, right peroneal veins, and right gastrocnemius veins. - No cystic structure found in the popliteal fossa.   LEFT: - No evidence of common femoral vein obstruction.   Final    Transthoracic Echocardiogram  00/00/2021 Pending  ECG - SRT rate 128 BPM. (See cardiology reading for complete details)  PHYSICAL EXAM Blood pressure 94/68, pulse (!) 131, temperature 99 F (37.2 C),  temperature source Oral, resp. rate (!) 24, height 6\' 1"  (1.854 m), weight 64.4 kg, SpO2 98 %. Frail malnourished looking middle-aged American male who is intubated but not sedated Neurological Exam :  Comatose and unresponsive.  Intubated.  Not sedated. Pupils are small 2 mm not reactive.  Corneal reflexes absent bilaterally.  Doll's  eye movements are absent.  He has spontaneous respiratory effort.  No cough or gag.  Tongue midline.  No spontaneous extremity movements.  No response to sternal rub or nailbed pressure in any of the 4 extremities.  Tone is slightly increased bilaterally.  Plantars both are mute.   ASSESSMENT/PLAN [name] is a 59 y.o. male with history of Htn and tobacco use admitted to [facility] February 17 with intractable hiccups, loss of appetite and diarrhea - complicated hospital course with dyspnea -> abnormal CXR -> resp failure -> intubation -> bradycardia -> asystole -> CPR -> TPA for possible PE -> hypotension -> AMS -> CT -> large right MCA territory stroke.  He receive IV t-PA for possible pulmonary embolism not for stroke.  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  Likely paradoxical embolism as stroke occurred in the setting of acute DVT suspect also component of hypoxic ischemic encephalopathy from cardiac arrest  Resultant comatose state with poor neurological exam  Code Stroke CT Head - not ordered  CT head - Acute infarction in the right middle cerebral artery territory, with sparing of the basal ganglia. Early brain swelling and low density. No evidence of hemorrhage or mass effect at this time. Background pattern of chronic small vessel ischemic changes elsewhere throughout the brain.   MRI head - pending  MRA head - not ordered  CTA H&N - pending  CT Perfusion - not ordered Bilateral Lower Extremity Venous Dopplers - Right LE DVT  TCDs with bubbles - pending  Carotid Doppler - CTA neck pending - carotid dopplers  not indicated  2D Echo - pending  Sars Corona Virus 2 - negative  LDL - pending  HgbA1c - 6.0  UDS - positive for THCU  VTE prophylaxis - Heparin IV Diet  Diet Order            Diet NPO time specified  Diet effective now        Diet - low sodium heart healthy               No antithrombotic prior to admission, now on heparin IV  Patient will be counseled to be compliant with his antithrombotic medications  Ongoing aggressive stroke risk factor management  Therapy recommendations:  pending  Disposition:  Pending  Hypertension  Home BP meds: HCTZ ; Norvasc ; Cozaar  Current BP meds: Lopressor / Levophed  Blood pressure running low . Permissive hypertension (OK if < 220/120) but gradually normalize in 5-7 days . Long-term BP goal normotensive  Hyperlipidemia  Home Lipid lowering medication: none  LDL - pending, goal < 70  Current lipid lowering medication: none   Continue statin at discharge  Other Stroke Risk Factors  Cigarette smoker -> advised to stop smoking  ETOH use, advised to drink no more than 1 alcoholic beverage per day.  Other Active Problems  Code status - Full Code  Tachycardia - Lopressor  Suspected pulmonary embolus - not proven  Right LE DVT - Heparin IV  Bilateral pneumonia  Mild Hypotension - Levophed   Anemia - Hb 9.3  Leukocytosis - 21.2->48.4->60.5  Hyperkalemia - 6.3  Ascending thoracic aortic aneurysm. Recommend semi-annual imaging followup by CTA or MRA and referral to cardiothoracic surgery if not already obtained. Aortic aneurysm NOS (ICD10-I71.9)    Hospital day # 18 Patient presented with pulmonary symptoms and underwent bronchoscopy yesterday and had a witnessed cardiac arrest with return of circulation after 14 minutes.  Neurological  exam is poor and likely reflects combination of the deficit from his stroke as well as hypoxic brain injury.  Recommend check MRI scan of the brain to look for extent of  hypoxic or ischemic brain injury or further strokes or post TPA hemorrhage as well as EEG.  Check transcranial Doppler bubble study to look for PFO and paradoxical embolism.  Keep blood pressure between 110-180 range systolic. continue IV heparin for his DVT and suspected pulmonary embolism will discuss with family prognosis and goals of care after about tests with respect outcome is not going to be good.  Discussed with Dr. Tonia Brooms and answered questions. This patient is critically ill and at significant risk of neurological worsening, death and care requires constant monitoring of vital signs, hemodynamics,respiratory and cardiac monitoring, extensive review of multiple databases, frequent neurological assessment, discussion with family, other specialists and medical decision making of high complexity.I have made any additions or clarifications directly to the above note.This critical care time does not reflect procedure time, or teaching time or supervisory time of PA/NP/Med Resident etc but could involve care discussion time.  I spent 35 minutes of neurocritical care time  in the care of  this patient. Delia Heady, MD    To contact Stroke Continuity provider, please refer to [email or web address]. After hours, contact General Neurology

## 2019-08-12 NOTE — Progress Notes (Addendum)
[facility] for Infectious Disease    Date of Admission:  07/26/2019   Total days of antibiotics-off          ID: Charles Walters is a 59 y.o. male with interstitial pneumonia of unknown source, intubated yesterday but coded overnight Principal Problem:   Acute respiratory failure with hypoxia (HCC) Active Problems:   Hyponatremia   Intractable hiccups   Diarrhea   Asthenia   Essential hypertension   Community acquired bacterial pneumonia   Acute interstitial pneumonia (HCC)   Leukocytosis   Sinus tachycardia   Tobacco abuse   Severe protein-calorie malnutrition (HCC)   Acute deep vein thrombosis (DVT) of right lower extremity (HCC)   Anemia   Cerebral embolism with cerebral infarction    Subjective: Afebrile, remains intubated. Code last night and work up revealed new right MCA cerebral infarct  Spoke with micro Respiratory culture -pinpoint ? Growth  BAL - scant growth  Medications:  . sodium chloride   Intravenous Once  . chlorhexidine gluconate (MEDLINE KIT)  15 mL Mouth Rinse BID  . Chlorhexidine Gluconate Cloth  6 each Topical Daily  . free water  200 mL Per Tube Q4H  . insulin aspart  0-6 Units Subcutaneous Q4H  . mouth rinse  15 mL Mouth Rinse 10 times per day  . methylPREDNISolone (SOLU-MEDROL) injection  60 mg Intravenous Q24H  . multivitamin with minerals  1 tablet Per Tube Daily  . nicotine  7 mg Transdermal Daily  . pantoprazole (PROTONIX) IV  40 mg Intravenous Q24H  . sodium chloride flush  10-40 mL Intracatheter Q12H  . sodium chloride flush  3 mL Intravenous Once  . sodium zirconium cyclosilicate  10 g Oral BID  . sulfamethoxazole-trimethoprim  1 tablet Per Tube Daily  . thiamine injection  100 mg Intravenous Daily   ROS: unable to obtain due to intubation Objective: Vital signs in last 24 hours: Temp:  [97.6 F (36.4 C)-99.2 F (37.3 C)] 98.9 F (37.2 C) (03/07 1158) Pulse Rate:  [25-133] 131 (03/07 0845) Resp:  [17-41] 24 (03/07  0845) BP: (70-169)/(58-145) 94/68 (03/07 0845) SpO2:  [78 %-100 %] 98 % (03/07 0900) FiO2 (%):  [80 %-100 %] 80 % (03/07 1118) Physical Exam  Constitutional: He is unresponsive.under-nourished. No distress.  HENT:  Poor dentition , bitemporal wasting Mouth/Throat: OETT in place. No oropharyngeal exudate.  Cardiovascular: tachy, regular rhythm and normal heart sounds. Exam reveals no gallop and no friction rub.  No murmur heard.  Pulmonary/Chest: Effort normal and breath sounds normal. No respiratory distress. He has no wheezes.  Abdominal: Soft. Bowel sounds are decreased. He exhibits no distension. There is no tenderness.  Neurological: not responsive, not following commands.  Skin: Skin is warm and dry. No rash noted. No erythema.   Lab Results Recent Labs    08/11/19 0345 08/11/19 2036 08/12/19 0318 08/12/19 0318 08/12/19 0518 08/12/19 0518 08/12/19 0701 08/12/19 1039  WBC  --  48.4* 60.5*  --   --   --   --   --   HGB  --  9.0* 9.3*  --   --   --   --   --   HCT  --  28.0* 29.2*  --   --   --   --   --   NA   < >  --  142  --  142  --   --   --   K   < >  --  6.7*   < >  6.5*   < > 6.3* 5.8*  CL   < >  --  108  --  108  --   --   --   CO2   < >  --  22  --  27  --   --   --   BUN   < >  --  39*  --  42*  --   --   --   CREATININE   < >  --  1.17  --  1.26*  --   --   --    < > = values in this interval not displayed.   Liver Panel Recent Labs    08/11/19 0345 08/12/19 0318  PROT 6.0* 6.3*  ALBUMIN 1.7* 1.8*  AST 37 60*  ALT 34 53*  ALKPHOS 78 95  BILITOT 0.5 0.7    Microbiology: Efaecalis, and strep anginosus. Studies/Results: CT HEAD WO CONTRAST  Result Date: 08/12/2019 CLINICAL DATA:  Encephalopathy following cardiopulmonary arrest. EXAM: CT HEAD WITHOUT CONTRAST TECHNIQUE: Contiguous axial images were obtained from the base of the skull through the vertex without intravenous contrast. COMPARISON:  None. FINDINGS: Brain: Chronic appearing small-vessel  changes affect the pons and cerebellum. There is loss of gray-white differentiation and mild diffuse low-density and swelling within the right middle cerebral artery cortical distribution consistent with acute infarction. There appears to be sparing of the basal ganglia. No evidence of hemorrhage or mass effect. Chronic small-vessel ischemic changes present elsewhere within the hemispheric white matter. No acute left hemisphere insult. Vascular: There is atherosclerotic calcification of the major vessels at the base of the brain. Skull: Negative Sinuses/Orbits: Clear except for a few small retention cysts. Orbits negative. Other: None IMPRESSION: Acute infarction in the right middle cerebral artery territory, with sparing of the basal ganglia. Early brain swelling and low density. No evidence of hemorrhage or mass effect at this time. Background pattern of chronic small vessel ischemic changes elsewhere throughout the brain. Electronically Signed   By: Nelson Chimes M.D.   On: 08/12/2019 01:38   DG CHEST PORT 1 VIEW  Result Date: 08/11/2019 CLINICAL DATA:  59 year old male with cardiac arrest and concern for pneumothorax. EXAM: PORTABLE CHEST 1 VIEW COMPARISON:  Chest radiograph dated 08/11/2019. FINDINGS: Endotracheal and enteric tube in similar position. Bilateral airspace opacities as seen previously. No pneumothorax. Stable cardiomediastinal silhouette. No acute osseous pathology. IMPRESSION: No interval change.  No pneumothorax. Electronically Signed   By: Anner Crete M.D.   On: 08/11/2019 18:28   DG CHEST PORT 1 VIEW  Result Date: 08/11/2019 CLINICAL DATA:  Intubated. EXAM: PORTABLE CHEST 1 VIEW COMPARISON:  08/09/2019 FINDINGS: Interval endotracheal tube in satisfactory position. Interval nasogastric tube with its tip and side hole in the proximal stomach. Interval mildly enlarged cardiac silhouette. Mild increase in patchy opacity and prominence of the interstitial markings in both lungs. No  pleural fluid seen. Lower thoracic spine degenerative changes. IMPRESSION: 1. Mildly progressive bilateral pneumonia. 2. Interval mild cardiomegaly. 3. Endotracheal tube and nasogastric tube in satisfactory position. Electronically Signed   By: Claudie Revering M.D.   On: 08/11/2019 12:37   VAS [place] LOWER EXTREMITY VENOUS (DVT)  Result Date: 08/11/2019  Lower Venous DVTStudy Indications: Swelling.  Comparison Study: No prior study Performing Technologist: Maudry Mayhew MHA, RDMS, RVT, RDCS  Examination Guidelines: A complete evaluation includes B-mode imaging, spectral Doppler, color Doppler, and power Doppler as needed of all accessible portions of each vessel. Bilateral testing is considered an integral part  of a complete examination. Limited examinations for reoccurring indications may be performed as noted. The reflux portion of the exam is performed with the patient in reverse Trendelenburg.  +---------+---------------+---------+-----------+----------+--------------+ RIGHT    CompressibilityPhasicitySpontaneityPropertiesThrombus Aging +---------+---------------+---------+-----------+----------+--------------+ CFV      Full           Yes      Yes                                 +---------+---------------+---------+-----------+----------+--------------+ SFJ      Full                                                        +---------+---------------+---------+-----------+----------+--------------+ FV Prox  Full                                                        +---------+---------------+---------+-----------+----------+--------------+ FV Mid   Full                                                        +---------+---------------+---------+-----------+----------+--------------+ FV DistalFull                                                        +---------+---------------+---------+-----------+----------+--------------+ PFV      Full                                                         +---------+---------------+---------+-----------+----------+--------------+ POP      None                                                        +---------+---------------+---------+-----------+----------+--------------+ PTV      None                    No                   Acute          +---------+---------------+---------+-----------+----------+--------------+ PERO     None                    No                   Acute          +---------+---------------+---------+-----------+----------+--------------+ Gastroc  None                    No  Acute          +---------+---------------+---------+-----------+----------+--------------+   +----+---------------+---------+-----------+----------+--------------+ LEFTCompressibilityPhasicitySpontaneityPropertiesThrombus Aging +----+---------------+---------+-----------+----------+--------------+ CFV Full           Yes      Yes                                 +----+---------------+---------+-----------+----------+--------------+     Summary: RIGHT: - Findings consistent with acute deep vein thrombosis involving the right popliteal vein, right posterior tibial veins, right peroneal veins, and right gastrocnemius veins. - No cystic structure found in the popliteal fossa.  LEFT: - No evidence of common femoral vein obstruction.  *See table(s) above for measurements and observations. Electronically signed by Monica Martinez MD on 08/11/2019 at 1:47:42 PM.    Final    [place] EKG SITE RITE  Result Date: 08/11/2019 If Site Rite image not attached, placement could not be confirmed due to current cardiac rhythm.    Assessment/Plan: Interstitial pneumonia with peripheral predominance crazy-paving pattern from 3/1 pulm CT = can see with pneumocystis but patient not know to be immunocompromised. Will empirically start bactrim IV at treatment doses until DFA returns. Also would provide some empiric  treatment for nocardia which would see in pulmonary/CNS infections.  Empiric coverage will also include streptococcal gram positive coverage based on prelim evaluation of BAL/respiratory culture with ceftriaxone plus empiric antifungal therapy. He is MRSA negative on screening. Will see what is identified from gram stain today to see if need to add vancomycin. For now, just bactrim, ceftriaxone, plus anidulafungin.  Comparison CXR from 2/17 to 3/6 reveals more diffuse patchy infiltrate, where suggests progression of pulmonary process. Consider when stable to repeat chest CT to see if associated cavitary lesions to newly affected areas or if it is more consistent with further "crazy-paving" pattern through lung fields bilaterally - and no longer just peripheral predominance  Recommend echo to see if evidence of endocarditis  Marked leukocytosis = unclear if all leukamoid stress reaction from yesterday's event from 20 -> 48 -> 60. Repeat WBC tomorrow   Addendum = will change ceftriaxone to amp/sub since efaecalis and strep species have been identified.  [facility] for Infectious Diseases Cell: [phone] Pager: [phone]  08/12/2019, 12:38 PM

## 2019-08-12 NOTE — Progress Notes (Signed)
Sondra RN notified PICC in proper placement for utilization.  Also notified 2 PIV's removed by PICC RN, heparin infusing via midline, and aware of need to d/c R wrist PIV once Levo infusing via PICC.

## 2019-08-12 NOTE — Progress Notes (Signed)
eLink Physician-Brief Progress Note Patient Name: Charles Walters DOB: Oct 27, 1960 MRN: [id number]   Date of Service  08/12/2019  HPI/Events of Note  Notified of K 6.5, creatinine 1.26  eICU Interventions  Hyperkalemia protocol ordered: D50/insulin, bicarb push, kayexalate  Discussed with Dr Amada Jupiter and he is agreement to allow permissive hypertension, MAP 90 or SBP low 100s     Intervention Category Major Interventions: Electrolyte abnormality - evaluation and management  Darl Pikes 08/12/2019, 6:52 AM

## 2019-08-12 NOTE — Progress Notes (Signed)
Spoke with Isaiah Serge RN re PICC order.  States will confirm with rounding physicians if PICC still needed.  Isaiah Serge RN to notify this RN once decision made.

## 2019-08-12 NOTE — Progress Notes (Signed)
Doctor Scatliff notified of critical wbc count (60), no orders given

## 2019-08-12 NOTE — Progress Notes (Signed)
Nitric weaned from 10ppm to zero. Nitric is on standby if needed.

## 2019-08-12 NOTE — Progress Notes (Signed)
SLP DISCHARGE Note  Patient Details Name: Charles Walters MRN: [id number] DOB: 1960-09-10   Cancelled treatment:       Reason Eval/Treat Not Completed: Medical issues which prohibited therapy  Patient coded yesterday and is now intubated and unresponsive per RN.  She suggested that we signed off.  Please re-consult when appropriate.  Thank you for the consult.   Dimas Aguas, MA, CCC-SLP Acute Rehab SLP [phone]  Fleet Contras 08/12/2019, 8:34 AM

## 2019-08-12 NOTE — Progress Notes (Signed)
eLink Physician-Brief Progress Note Patient Name: Charles Walters DOB: 10/26/1960 MRN: [id number]   Date of Service  08/12/2019  HPI/Events of Note  Head CT reviewed, acute infarct noted in the right middle cerebral artery territory.  eICU Interventions  SBP now 90s, MAP 70s. Will titrate norepinephrine for MAP of 90 to improved cerebral perfusion pressure Maintain euglycemia and euthermia     Intervention Category Intermediate Interventions: Diagnostic test evaluation  Darl Pikes 08/12/2019, 2:44 AM

## 2019-08-12 NOTE — Progress Notes (Signed)
EEG complete - results pending 

## 2019-08-12 NOTE — Consult Note (Signed)
Neurology Consultation Reason for Consult: Stroke Referring Physician: Scatliffe  CC: Stroke  History is obtained from: Chart  HPI: Charles Walters is a 59 y.o. male admitted initially on February 17 with intractable hiccups, loss of appetite and diarrhea.  At that time, he was complaining of some shortness of breath with exertion but not at rest.  He was found to have a sodium of 114 and chest x-ray concerning for fibrosis or atypical pneumonia.  He had persistent problems with his respiratory system.  On March 2, he had worsening respiratory failure and was transferred to the ICU for high flow nasal cannula but not intubated..  At that time he was alert and oriented and remained that way until 3/5.  At that time was noted that he was getting agitated, and had worsening blood loss.  Yesterday he was intubated so that a bronchoscopy could be performed.  During that day he has noted to be alert and oriented.  Around 5 PM yesterday, he had a CODE BLUE.  He was initially bradycardic and subsequently asystolic.  He had 4 rounds of epinephrine 14 minutes of CPR performed.  Due to concern for PE he was given IV TPA.  Following this, he continued to have some problems with hypotension.  It was noted that he was having some problems with his mental status and therefore a CT scan was performed which demonstrates a rather large right MCA territory stroke   LKW: Prior to code at 5 PM yesterday tpa given?:  Yes but for pulmonary reasons   ROS: A 14 point ROS was performed and is negative except as noted in the HPI.   Past Medical History:  Diagnosis Date  . Hypertension      History reviewed. No pertinent family history.   Social History:  reports that he has been smoking. He has never used smokeless tobacco. He reports current alcohol use. He reports that he does not use drugs.   Exam: Current vital signs: BP 108/75   Pulse (!) 128   Temp 98.8 F (37.1 C) (Axillary)   Resp (!) 25   Ht 6\' 1"   (1.854 m)   Wt 64.4 kg   SpO2 97%   BMI 18.73 kg/m  Vital signs in last 24 hours: Temp:  [97.6 F (36.4 C)-99.2 F (37.3 C)] 98.8 F (37.1 C) (03/07 0315) Pulse Rate:  [25-139] 128 (03/07 0445) Resp:  [14-37] 25 (03/07 0445) BP: (70-169)/(58-145) 108/75 (03/07 0445) SpO2:  [78 %-100 %] 97 % (03/07 0445) FiO2 (%):  [70 %-100 %] 100 % (03/07 0401)   Physical Exam  Constitutional: Appears well-developed and well-nourished.  Psych: Does not respond Eyes: No scleral injection HENT: ET tube in place MSK: no joint deformities.  Cardiovascular: Normal rate and regular rhythm.  Respiratory: Ventilated GI: Soft.  No distension. There is no tenderness.  Skin: WDI  Neuro: Mental Status: Patient is comatose, does not open eyes or follow commands Cranial Nerves: II: Does not blink to threat pupils are equal, round, and reactive to light.   III,IV, VI: Doll's eye intact V: VII: Corneal is present bilaterally, though weak on the left Motor: No movement to noxious stimulation Sensory: As above Deep Tendon Reflexes: 3+ and symmetric in patellae.  Plantars: Toes are upgoing on the right, down on the left Cerebellar: Does not perform      I have reviewed labs in epic and the results pertinent to this consultation are: Potassium 6.7 White count of 60.5 Hematocrit 29  I have reviewed the images obtained: CT head-large right MCA stroke.  Impression: 59 year old male with acute encephalopathy after cardiac arrest.  Given the large right MCA infarct seen, I suspect that he likely has more infarcts that are not seen.  The right MCA infarct alone would not be expected to give this degree of encephalopathy, but certainly an embolic shower could.  If he has severe edema from that right MCA stroke, then hypertonic saline might have another role in his care.  Recommendations: 1) MRI brain 2) though there is some increased risk of ICH with heparin in the setting, I suspect that the risk  of stopping it is high given his recent PE, would not favor stopping it 3) if MRI cannot be performed, would repeat CT in a relatively short interval such as around noon. 4) continue to follow  Roland Rack, MD [facility] [phone]  If 7pm- 7am, please page neurology on call as listed in [place].

## 2019-08-12 NOTE — Progress Notes (Signed)
OT Cancellation Note  Patient Details Name: Charles Walters MRN: [id number] DOB: [birth year]   Cancelled Treatment:    Reason Eval/Treat Not Completed: Patient not medically ready New order acknowledged. Noted pt with new CVA and now intubated. Will hold this date and continue to follow as more medically stable.  Dalphine Handing, MSOT, OTR/L Acute Rehabilitation Services [facility] Office Number: [phone] Pager: [phone]  Dalphine Handing 08/12/2019, 7:58 AM

## 2019-08-12 NOTE — Progress Notes (Signed)
Peripherally Inserted Central Catheter/Midline Placement  The IV Nurse has discussed with the patient and/or persons authorized to consent for the patient, the purpose of this procedure and the potential benefits and risks involved with this procedure.  The benefits include less needle sticks, lab draws from the catheter, and the patient may be discharged home with the catheter. Risks include, but not limited to, infection, bleeding, blood clot (thrombus formation), and puncture of an artery; nerve damage and irregular heartbeat and possibility to perform a PICC exchange if needed/ordered by physician.  Alternatives to this procedure were also discussed.  Bard Power PICC patient education guide, fact sheet on infection prevention and patient information card has been provided to patient /or left at bedside.  Telephone consent obtained from the daughter and pts sister.  PICC/Midline Placement Documentation  PICC Double Lumen 08/12/19 PICC Right Brachial 40 cm 0 cm (Active)  Indication for Insertion or Continuance of Line Vasoactive infusions;Prolonged intravenous therapies 08/12/19 1543  Exposed Catheter (cm) 0 cm 08/12/19 1543  Site Assessment Clean;Dry;Intact 08/12/19 1543  Lumen #1 Status Flushed;Saline locked;Blood return noted 08/12/19 1543  Lumen #2 Status Flushed;Saline locked;Blood return noted 08/12/19 1543  Dressing Type Transparent 08/12/19 1543  Dressing Status Clean;Dry;Intact;Antimicrobial disc in place 08/12/19 1543  Line Care Connections checked and tightened 08/12/19 1543  Line Adjustment (NICU/IV Team Only) No 08/12/19 1543  Dressing Intervention New dressing 08/12/19 1543  Dressing Change Due 08/19/19 08/12/19 1543       Elliot Dally 08/12/2019, 3:44 PM

## 2019-08-12 NOTE — Progress Notes (Signed)
md ccm made aware of critical k level orders pending

## 2019-08-13 ENCOUNTER — Inpatient Hospital Stay (HOSPITAL_COMMUNITY): Payer: Medicaid Other

## 2019-08-13 ENCOUNTER — Other Ambulatory Visit (HOSPITAL_COMMUNITY): Payer: Self-pay

## 2019-08-13 DIAGNOSIS — D62 Acute posthemorrhagic anemia: Secondary | ICD-10-CM

## 2019-08-13 DIAGNOSIS — J939 Pneumothorax, unspecified: Secondary | ICD-10-CM

## 2019-08-13 DIAGNOSIS — Z9911 Dependence on respirator [ventilator] status: Secondary | ICD-10-CM

## 2019-08-13 DIAGNOSIS — J158 Pneumonia due to other specified bacteria: Principal | ICD-10-CM

## 2019-08-13 DIAGNOSIS — J948 Other specified pleural conditions: Secondary | ICD-10-CM

## 2019-08-13 DIAGNOSIS — Z978 Presence of other specified devices: Secondary | ICD-10-CM

## 2019-08-13 DIAGNOSIS — I63511 Cerebral infarction due to unspecified occlusion or stenosis of right middle cerebral artery: Secondary | ICD-10-CM

## 2019-08-13 DIAGNOSIS — I82431 Acute embolism and thrombosis of right popliteal vein: Secondary | ICD-10-CM

## 2019-08-13 DIAGNOSIS — I6340 Cerebral infarction due to embolism of unspecified cerebral artery: Secondary | ICD-10-CM

## 2019-08-13 DIAGNOSIS — G931 Anoxic brain damage, not elsewhere classified: Secondary | ICD-10-CM

## 2019-08-13 DIAGNOSIS — I6389 Other cerebral infarction: Secondary | ICD-10-CM

## 2019-08-13 DIAGNOSIS — R579 Shock, unspecified: Secondary | ICD-10-CM

## 2019-08-13 DIAGNOSIS — Z9689 Presence of other specified functional implants: Secondary | ICD-10-CM

## 2019-08-13 DIAGNOSIS — J930 Spontaneous tension pneumothorax: Secondary | ICD-10-CM

## 2019-08-13 LAB — CULTURE, BLOOD (ROUTINE X 2)
Culture: NO GROWTH
Culture: NO GROWTH

## 2019-08-13 LAB — COMPREHENSIVE METABOLIC PANEL
ALT: 33 U/L (ref 0–44)
AST: 25 U/L (ref 15–41)
Albumin: 1.6 g/dL — ABNORMAL LOW (ref 3.5–5.0)
Alkaline Phosphatase: 71 U/L (ref 38–126)
Anion gap: 6 (ref 5–15)
BUN: 46 mg/dL — ABNORMAL HIGH (ref 6–20)
CO2: 28 mmol/L (ref 22–32)
Calcium: 8.2 mg/dL — ABNORMAL LOW (ref 8.9–10.3)
Chloride: 107 mmol/L (ref 98–111)
Creatinine, Ser: 1.39 mg/dL — ABNORMAL HIGH (ref 0.61–1.24)
GFR calc Af Amer: >60 mL/min (ref >60)
GFR calc non Af Amer: 56 mL/min — ABNORMAL LOW (ref >60)
Glucose, Bld: 183 mg/dL — ABNORMAL HIGH (ref 70–99)
Potassium: 5 mmol/L (ref 3.5–5.1)
Sodium: 141 mmol/L (ref 135–145)
Total Bilirubin: 0.6 mg/dL (ref 0.3–1.2)
Total Protein: 5.6 g/dL — ABNORMAL LOW (ref 6.5–8.1)

## 2019-08-13 LAB — POCT I-STAT 7, (LYTES, BLD GAS, ICA,H+H)
Acid-Base Excess: 3 mmol/L — ABNORMAL HIGH (ref 0.0–2.0)
Bicarbonate: 27.2 mmol/L (ref 20.0–28.0)
Bicarbonate: 29.9 mmol/L — ABNORMAL HIGH (ref 20.0–28.0)
Calcium, Ion: 1.24 mmol/L (ref 1.15–1.40)
Calcium, Ion: 1.29 mmol/L (ref 1.15–1.40)
HCT: 21 % — ABNORMAL LOW (ref 39.0–52.0)
HCT: 22 % — ABNORMAL LOW (ref 39.0–52.0)
Hemoglobin: 7.1 g/dL — ABNORMAL LOW (ref 13.0–17.0)
Hemoglobin: 7.5 g/dL — ABNORMAL LOW (ref 13.0–17.0)
O2 Saturation: 80 %
O2 Saturation: 97 %
Patient temperature: 98.1
Patient temperature: 98.7
Potassium: 4.8 mmol/L (ref 3.5–5.1)
Potassium: 4.9 mmol/L (ref 3.5–5.1)
Sodium: 141 mmol/L (ref 135–145)
Sodium: 143 mmol/L (ref 135–145)
TCO2: 29 mmol/L (ref 22–32)
TCO2: 32 mmol/L (ref 22–32)
pCO2 arterial: 58 mmHg — ABNORMAL HIGH (ref 32.0–48.0)
pCO2 arterial: 58.8 mmHg — ABNORMAL HIGH (ref 32.0–48.0)
pH, Arterial: 7.278 — ABNORMAL LOW (ref 7.350–7.450)
pH, Arterial: 7.314 — ABNORMAL LOW (ref 7.350–7.450)
pO2, Arterial: 104 mmHg (ref 83.0–108.0)
pO2, Arterial: 51 mmHg — ABNORMAL LOW (ref 83.0–108.0)

## 2019-08-13 LAB — CREATININE, SERUM
Creatinine, Ser: 1.43 mg/dL — ABNORMAL HIGH (ref 0.61–1.24)
GFR calc Af Amer: >60 mL/min (ref >60)
GFR calc non Af Amer: 54 mL/min — ABNORMAL LOW (ref >60)

## 2019-08-13 LAB — COOXEMETRY PANEL
Carboxyhemoglobin: 1.2 % (ref 0.5–1.5)
Methemoglobin: 1.2 % (ref 0.0–1.5)
O2 Saturation: 71.3 %
Total hemoglobin: 7.7 g/dL — ABNORMAL LOW (ref 12.0–16.0)

## 2019-08-13 LAB — LIPID PANEL
Cholesterol: 90 mg/dL (ref 0–200)
HDL: 32 mg/dL — ABNORMAL LOW (ref >40)
LDL Cholesterol: 36 mg/dL (ref 0–99)
Total CHOL/HDL Ratio: 2.8 RATIO
Triglycerides: 112 mg/dL (ref <150)
VLDL: 22 mg/dL (ref 0–40)

## 2019-08-13 LAB — CBC
HCT: 22.4 % — ABNORMAL LOW (ref 39.0–52.0)
HCT: 21.2 % — ABNORMAL LOW (ref 39.0–52.0)
Hemoglobin: 7 g/dL — ABNORMAL LOW (ref 13.0–17.0)
Hemoglobin: 6.6 g/dL — CL (ref 13.0–17.0)
MCH: 31.3 pg (ref 26.0–34.0)
MCH: 31.1 pg (ref 26.0–34.0)
MCHC: 31.3 g/dL (ref 30.0–36.0)
MCHC: 31.1 g/dL (ref 30.0–36.0)
MCV: 100 fL (ref 80.0–100.0)
MCV: 100 fL (ref 80.0–100.0)
Platelets: 187 10*3/uL (ref 150–400)
Platelets: 191 10*3/uL (ref 150–400)
RBC: 2.24 MIL/uL — ABNORMAL LOW (ref 4.22–5.81)
RBC: 2.12 MIL/uL — ABNORMAL LOW (ref 4.22–5.81)
RDW: 15.8 % — ABNORMAL HIGH (ref 11.5–15.5)
RDW: 15.8 % — ABNORMAL HIGH (ref 11.5–15.5)
WBC: 42.3 10*3/uL — ABNORMAL HIGH (ref 4.0–10.5)
WBC: 33.6 10*3/uL — ABNORMAL HIGH (ref 4.0–10.5)
nRBC: 0.1 % (ref 0.0–0.2)
nRBC: 0.2 % (ref 0.0–0.2)

## 2019-08-13 LAB — HEMOGLOBIN A1C
Hgb A1c MFr Bld: 6 % — ABNORMAL HIGH (ref 4.8–5.6)
Mean Plasma Glucose: 125.5 mg/dL

## 2019-08-13 LAB — PREPARE RBC (CROSSMATCH)

## 2019-08-13 LAB — GLUCOSE, CAPILLARY
Glucose-Capillary: 125 mg/dL — ABNORMAL HIGH (ref 70–99)
Glucose-Capillary: 142 mg/dL — ABNORMAL HIGH (ref 70–99)
Glucose-Capillary: 142 mg/dL — ABNORMAL HIGH (ref 70–99)
Glucose-Capillary: 156 mg/dL — ABNORMAL HIGH (ref 70–99)
Glucose-Capillary: 159 mg/dL — ABNORMAL HIGH (ref 70–99)
Glucose-Capillary: 167 mg/dL — ABNORMAL HIGH (ref 70–99)
Glucose-Capillary: 248 mg/dL — ABNORMAL HIGH (ref 70–99)
Glucose-Capillary: 40 mg/dL — CL (ref 70–99)

## 2019-08-13 LAB — HEPARIN LEVEL (UNFRACTIONATED): Heparin Unfractionated: <0.1 IU/mL — ABNORMAL LOW (ref 0.30–0.70)

## 2019-08-13 LAB — POTASSIUM
Potassium: 4.7 mmol/L (ref 3.5–5.1)
Potassium: 4.8 mmol/L (ref 3.5–5.1)

## 2019-08-13 MED ORDER — FENTANYL CITRATE (PF) 100 MCG/2ML IJ SOLN
INTRAMUSCULAR | Status: AC
  Administered 2019-08-13: 02:00:00 75 ug
  Filled 2019-08-13: qty 2

## 2019-08-13 MED ORDER — OSMOLITE 1.2 CAL PO LIQD
1000.0000 mL | ORAL | Status: DC
  Administered 2019-08-14: 1000 mL
  Filled 2019-08-13 (×7): qty 1000

## 2019-08-13 MED ORDER — HEPARIN (PORCINE) 25000 UT/250ML-% IV SOLN: 1200.0000 [IU]/h | INTRAVENOUS | Status: DC

## 2019-08-13 MED ORDER — PRO-STAT SUGAR FREE PO LIQD
30.0000 mL | Freq: Every day | ORAL | Status: DC
  Administered 2019-08-13 – 2019-08-17 (×5): 30 mL
  Filled 2019-08-13 (×5): qty 30

## 2019-08-13 MED ORDER — SODIUM CHLORIDE 0.9% IV SOLUTION: Freq: Once | INTRAVENOUS | Status: DC

## 2019-08-13 MED FILL — Medication: Qty: 1 | Status: AC

## 2019-08-13 NOTE — Progress Notes (Signed)
OT Cancellation Note  Patient Details Name: Charles Walters MRN: [id number] DOB: Jan 23, 1961   Cancelled Treatment:    Reason Eval/Treat Not Completed: Patient not medically ready;Medical issues which prohibited therapy  Patient not medically ready. Spoke with RN. Pt now unable to participate in therapy due to decline in med status. New large R MCA stroke. Pt remains intubated and unresponsive. OT to sign off. Please re-consult if med status improves.   Flora Lipps, OTR/L Acute Rehabilitation Services Pager: [phone] Office: [phone]   Lonzo Cloud 08/13/2019, 10:15 AM

## 2019-08-13 NOTE — Progress Notes (Signed)
ANTICOAGULATION CONSULT NOTE  Pharmacy Consult for lovenox>>heparin Indication: DVT , R/o PE No Known Allergies  Patient Measurements: Height: 6\' 1"  (185.4 cm) Weight: 144 lb 4.8 oz (65.5 kg) IBW/kg (Calculated) : 79.9 Heparin Dosing Weight: 64kg  Vital Signs: Temp: 98.7 F (37.1 C) (03/08 1134) Temp Source: Oral (03/08 1134) BP: 106/78 (03/08 1134) Pulse Rate: 118 (03/08 1115)  Labs: Recent Labs    08/11/19 2036 08/11/19 2036 08/12/19 0318 08/12/19 0318 08/12/19 0518 08/12/19 0701 08/12/19 2013 08/13/19 0045 08/13/19 0439 08/13/19 0825  HGB 9.0*   < > 9.3*   < >  --   --   --  7.5* 7.0*  --   HCT 28.0*   < > 29.2*  --   --   --   --  22.0* 22.4*  --   PLT 195  --  201  --   --   --   --   --  187  --   APTT 30  --   --   --   --   --   --   --   --   --   LABPROT 17.7*  --   --   --   --   --   --   --   --   --   INR 1.5*  --   --   --   --   --   --   --   --   --   HEPARINUNFRC  --   --   --   --   --  0.26* 0.29*  --  <0.10*  --   CREATININE  --   --  1.17  --  1.26*  --   --   --   --  1.43*   < > = values in this interval not displayed.    Estimated Creatinine Clearance: 52.8 mL/min (A) (by C-G formula based on SCr of 1.43 mg/dL (H)).   Medical History: Past Medical History:  Diagnosis Date  . Hypertension    Assessment: 58 year old male with newly diagnosed DVT 3/5 placed on full dose lovenox 3/5 now s/p cardiac arrest with tPA administered. Pharmacy asked to transition to IV heparin. CT head with acute infarct, per neuro ok to keep IV heparin for now, will utilize no bolus and low goal.  Heparin held this morning after chest tube placement, H/H low and ongoing bloody chest tube output. Discussed with CCM - will restart heparin and watch closely.  Goal of Therapy:  Heparin level 0.3-0.5 units/ml Monitor platelets by anticoagulation protocol: Yes   Plan:  -Restart heparin 1200 units/h -Check 6hr heparin level   59, PharmD,  BCPS Clinical Pharmacist [phone] Please check AMION for all [facility] numbers 08/13/2019

## 2019-08-13 NOTE — Progress Notes (Signed)
PT Cancellation Note  Patient Details Name: Charles Walters MRN: [id number] DOB: 03-02-1961   Cancelled Treatment:    Reason Eval/Treat Not Completed: Patient not medically ready. Spoke with RN. Pt now unable to participate in therapy due to decline in med status. New large R MCA stroke. Pt remains intubated and unresponsive. PT to sign off. Please re-consult if med status improves.   Ilda Foil 08/13/2019, 9:56 AM  Aida Raider, PT  Office # [phone] Pager [phone]

## 2019-08-13 NOTE — Procedures (Signed)
Chest Tube Insertion Procedure Note  Indications:  Clinically significant Pneumothorax  Pre-operative Diagnosis: Pneumothorax  Post-operative Diagnosis: Pneumothorax  Procedure Details  Informed consent was obtained for the procedure, including sedation.  Risks of lung perforation, hemorrhage, arrhythmia, and adverse drug reaction were discussed.   After sterile skin prep, using standard technique, a 14 French tube was placed in the right lateral 5th rib space.  Findings: Release of air  Estimated Blood Loss:  200 mL         Specimens:  None              Complications:  None; patient tolerated the procedure well. Bloody serosanguinous drainage         Disposition: ICU - intubated and critically ill.         Condition: stable

## 2019-08-13 NOTE — Progress Notes (Signed)
[facility]    Date of Admission:  07/11/2019   Total days of antibiotics 17        Day 3 bactrim        Day 2 amp.sub           ID: Charles Walters is a 59 y.o. male with respiratory distress on vent, for interstitial pneumonia, and right MCA stroke Principal Problem:   Acute respiratory failure with hypoxia ([place]) Active Problems:   Hyponatremia   Intractable hiccups   Diarrhea   Asthenia   Essential hypertension   Community acquired bacterial pneumonia   Acute interstitial pneumonia (HCC)   Leukocytosis   Sinus tachycardia   Tobacco abuse   Severe protein-calorie malnutrition (HCC)   Acute deep vein thrombosis (DVT) of right lower extremity (HCC)   Anemia   Cerebral embolism with cerebral infarction   Spontaneous tension pneumothorax    Subjective: Afebrile, remains solomnent not responding to commands. Developed pneumothorax required chest tube placement. Serosanguinous fluid in chest tube collection roughly at 146m  Medications:  . sodium chloride   Intravenous Once  . chlorhexidine gluconate (MEDLINE KIT)  15 mL Mouth Rinse BID  . Chlorhexidine Gluconate Cloth  6 each Topical Daily  . feeding supplement (PRO-STAT SUGAR FREE 64)  30 mL Per Tube Daily  . free water  200 mL Per Tube Q4H  . insulin aspart  0-6 Units Subcutaneous Q4H  . mouth rinse  15 mL Mouth Rinse 10 times per day  . methylPREDNISolone (SOLU-MEDROL) injection  60 mg Intravenous Q24H  . multivitamin  15 mL Per Tube Daily  . nicotine  7 mg Transdermal Daily  . pantoprazole (PROTONIX) IV  40 mg Intravenous Q24H  . sodium chloride flush  3 mL Intravenous Once  . thiamine injection  100 mg Intravenous Daily    Objective: Vital signs in last 24 hours: Temp:  [97.9 F (36.6 C)-99.1 F (37.3 C)] 99.1 F (37.3 C) (03/08 1537) Pulse Rate:  [65-130] 121 (03/08 1500) Resp:  [23-48] 34 (03/08 1500) BP: (76-224)/(18-138) 112/83 (03/08 1500) SpO2:  [79 %-100 %] 100 % (03/08  1500) FiO2 (%):  [50 %-100 %] 70 % (03/08 1203) Weight:  [65.5 kg] 65.5 kg (03/08 0700) Physical Exam  Constitutional: solomnent, intubated not on sedation. He appears well-developed and well-nourished. No distress.  HENT:  Mouth/Throat: OETT in place No oropharyngeal exudate.  Cardiovascular: Normal rate, regular rhythm and normal heart sounds. Exam reveals no gallop and no friction rub.  No murmur heard.  Pulmonary/Chest: Effort normal and breath sounds decreased on right. No respiratory distress. He has no wheezes.  Abdominal: Soft. Bowel sounds are decreased. He exhibits no distension. There is no tenderness.  Neurological: He is obtunded. Left foot upgoing toes Skin: Skin is warm and dry. No rash noted. No erythema.   Lab Results Recent Labs    08/12/19 0518 08/12/19 0518 08/12/19 0701 08/13/19 0045 08/13/19 0439 08/13/19 0825 08/13/19 1315 08/13/19 1500  WBC  --   --    < >  --  42.3*  --   --  PENDING  HGB  --   --    < >   < > 7.0*  --  7.1* 6.6*  HCT  --   --    < >   < > 22.4*  --  21.0* 21.2*  NA 142  --    < >   < >  --   --  143 141  K 6.5*  --    < >   < > 4.8  --  4.9 5.0  CL 108  --   --   --   --   --   --  107  CO2 27  --   --   --   --   --   --  28  BUN 42*  --   --   --   --   --   --  46*  CREATININE 1.26*   < >  --   --   --  1.43*  --  1.39*   < > = values in this interval not displayed.   Liver Panel Recent Labs    08/12/19 0318 08/13/19 1500  PROT 6.3* 5.6*  ALBUMIN 1.8* 1.6*  AST 60* 25  ALT 53* 33  ALKPHOS 95 71  BILITOT 0.7 0.6    Microbiology: Reviewed. efaecalis BAL Studies/Results: CT ANGIO HEAD W OR WO CONTRAST  Addendum Date: 08/12/2019   ADDENDUM REPORT: 08/12/2019 14:57 ADDENDUM: These results were called by telephone at the time of interpretation on 08/12/2019 at 2:17 pm to provider PRAMOD SETHI , who verbally acknowledged these results. Electronically Signed   By: Kellie Simmering DO   On: 08/12/2019 14:57   Result Date:  08/12/2019 CLINICAL DATA:  Stroke, follow-up. EXAM: CT ANGIOGRAPHY HEAD AND NECK TECHNIQUE: Multidetector CT imaging of the head and neck was performed using the standard protocol during bolus administration of intravenous contrast. Multiplanar CT image reconstructions and MIPs were obtained to evaluate the vascular anatomy. Carotid stenosis measurements (when applicable) are obtained utilizing NASCET criteria, using the distal internal carotid diameter as the denominator. CONTRAST:  19m OMNIPAQUE IOHEXOL 350 MG/ML SOLN COMPARISON:  Noncontrast CT head 08/12/2019 FINDINGS: CT HEAD FINDINGS Brain: Continued interval demarcation of abnormal cortical/subcortical hypodensity throughout much of the right MCA vascular territory consistent with acute infarction. There is most notable involvement of the right frontal and parietal lobes with the relative sparing of the right temporal lobe and right basal ganglia. No evidence of hemorrhagic conversion. Mild mass effect with effacement of right-sided cerebral sulci. No significant effacement of the ventricular system. No midline shift. Redemonstrated chronic small-vessel ischemic changes within the pons. Ill-defined hypoattenuation within the cerebral white matter is nonspecific, but consistent with chronic small vessel ischemic disease. Chronic small vessel ischemic changes again demonstrated within the pons. Redemonstrated age-indeterminate infarcts in the bilateral cerebellar hemispheres. Unchanged mild generalized parenchymal atrophy. Vascular: Reported separately. Skull: Normal. Negative for fracture or focal lesion. Sinuses: Left maxillary sinus mucous retention cyst. Small right mastoid effusion. Orbits: Visualized orbits demonstrate no acute abnormality. Review of the MIP images confirms the above findings CTA NECK FINDINGS Mildly motion degraded examination. Aortic arch: Standard aortic branching. The visualized aortic arch is unremarkable. No significant innominate  or proximal subclavian artery stenosis. Right carotid system: CCA and ICA patent within the neck without stenosis. Minimal calcified plaque within the carotid bifurcation and proximal ICA. Left carotid system: CCA and ICA patent within the neck without significant stenosis (50% or greater). Mild mixed plaque within the carotid bifurcation and proximal ICA. Vertebral arteries: The right vertebral artery is dominant and patent throughout the neck without significant stenosis. The non dominant left vertebral artery is developmentally diminutive but patent throughout the neck. Skeleton: Acute fracture of the anterolateral right second rib. Other neck: No neck mass or cervical lymphadenopathy. Upper chest: Extensive airspace disease within the partially imaged right lung apex.  Partially imaged, there is a probable hydropneumothorax. Ill-defined ground-glass opacity is also present within the imaged left upper lobe. ET tube terminates above the level of the carina. Partially visualized enteric tube. Review of the MIP images confirms the above findings CTA HEAD FINDINGS Anterior circulation: The intracranial right internal carotid artery is patent with scattered calcified plaque. There is up to moderate stenosis within the paraclinoid segment. Otherwise no more than mild narrowing of this vessel. The intracranial left internal carotid artery is patent with scattered calcified plaque. No more than mild stenosis within this vessel. The M1 middle cerebral arteries are patent without significant stenosis. Marked attenuation of M2 and more distal right MCA branches with occlusion of multiple proximal right M2 branches. No left M2 proximal branch occlusion or high-grade proximal stenosis is identified. The anterior cerebral arteries are patent without high-grade proximal stenosis. No intracranial aneurysm is identified. Posterior circulation: The intracranial vertebral arteries are patent. Calcified plaque within the V4 right  vertebral artery with mild-to-moderate stenosis. The basilar artery is patent without significant stenosis. The posterior cerebral arteries are patent bilaterally without high-grade proximal stenosis. Sizable posterior communicating arteries are present bilaterally. Venous sinuses: Within limitations of contrast timing, no convincing thrombus. Anatomic variants: None significant Review of the MIP images confirms the above findings IMPRESSION: CT head: 1. Continued interval demarcation of acute ischemic infarction changes throughout much of the right MCA vascular territory. No evidence of hemorrhagic conversion. No midline shift. 2. Age indeterminate infarcts in the bilateral cerebellar hemispheres. 3. Chronic ischemic changes within the cerebral white matter and pons. CTA neck: 1. The bilateral common carotid, internal carotid and vertebral arteries are patent within the neck without significant stenosis (50% or greater). Plaque within the left greater than right carotid bifurcations and proximal ICAs. 2. Partially visualized probable right-sided hydropneumothorax. Consider dedicated chest CT for confirmation. 3. Airspace disease within the right greater than left imaged lung apices. Findings may reflect aspiration/pneumonia or contusion. 4. Mildly displaced fracture of the anterolateral right second rib. CTA head: 1. Marked attenuation of multiple M2 and more distal right MCA branch vessels with occlusion of multiple proximal right M2 MCA branches. 2. No intracranial large vessel occlusion or proximal high-grade arterial stenosis identified elsewhere. 3. Calcified plaque within the intracranial internal carotid arteries. Up to moderate stenosis within the paraclinoid right ICA. Electronically Signed: By: Kellie Simmering DO On: 08/12/2019 14:10   CT HEAD WO CONTRAST  Result Date: 08/12/2019 CLINICAL DATA:  Encephalopathy following cardiopulmonary arrest. EXAM: CT HEAD WITHOUT CONTRAST TECHNIQUE: Contiguous axial  images were obtained from the base of the skull through the vertex without intravenous contrast. COMPARISON:  None. FINDINGS: Brain: Chronic appearing small-vessel changes affect the pons and cerebellum. There is loss of gray-white differentiation and mild diffuse low-density and swelling within the right middle cerebral artery cortical distribution consistent with acute infarction. There appears to be sparing of the basal ganglia. No evidence of hemorrhage or mass effect. Chronic small-vessel ischemic changes present elsewhere within the hemispheric white matter. No acute left hemisphere insult. Vascular: There is atherosclerotic calcification of the major vessels at the base of the brain. Skull: Negative Sinuses/Orbits: Clear except for a few small retention cysts. Orbits negative. Other: None IMPRESSION: Acute infarction in the right middle cerebral artery territory, with sparing of the basal ganglia. Early brain swelling and low density. No evidence of hemorrhage or mass effect at this time. Background pattern of chronic small vessel ischemic changes elsewhere throughout the brain. Electronically Signed   By: Elta Guadeloupe  Shogry M.D.   On: 08/12/2019 01:38   CT ANGIO NECK W OR WO CONTRAST  Addendum Date: 08/12/2019   ADDENDUM REPORT: 08/12/2019 14:57 ADDENDUM: These results were called by telephone at the time of interpretation on 08/12/2019 at 2:17 pm to provider PRAMOD SETHI , who verbally acknowledged these results. Electronically Signed   By: Kellie Simmering DO   On: 08/12/2019 14:57   Result Date: 08/12/2019 CLINICAL DATA:  Stroke, follow-up. EXAM: CT ANGIOGRAPHY HEAD AND NECK TECHNIQUE: Multidetector CT imaging of the head and neck was performed using the standard protocol during bolus administration of intravenous contrast. Multiplanar CT image reconstructions and MIPs were obtained to evaluate the vascular anatomy. Carotid stenosis measurements (when applicable) are obtained utilizing NASCET criteria, using the  distal internal carotid diameter as the denominator. CONTRAST:  10m OMNIPAQUE IOHEXOL 350 MG/ML SOLN COMPARISON:  Noncontrast CT head 08/12/2019 FINDINGS: CT HEAD FINDINGS Brain: Continued interval demarcation of abnormal cortical/subcortical hypodensity throughout much of the right MCA vascular territory consistent with acute infarction. There is most notable involvement of the right frontal and parietal lobes with the relative sparing of the right temporal lobe and right basal ganglia. No evidence of hemorrhagic conversion. Mild mass effect with effacement of right-sided cerebral sulci. No significant effacement of the ventricular system. No midline shift. Redemonstrated chronic small-vessel ischemic changes within the pons. Ill-defined hypoattenuation within the cerebral white matter is nonspecific, but consistent with chronic small vessel ischemic disease. Chronic small vessel ischemic changes again demonstrated within the pons. Redemonstrated age-indeterminate infarcts in the bilateral cerebellar hemispheres. Unchanged mild generalized parenchymal atrophy. Vascular: Reported separately. Skull: Normal. Negative for fracture or focal lesion. Sinuses: Left maxillary sinus mucous retention cyst. Small right mastoid effusion. Orbits: Visualized orbits demonstrate no acute abnormality. Review of the MIP images confirms the above findings CTA NECK FINDINGS Mildly motion degraded examination. Aortic arch: Standard aortic branching. The visualized aortic arch is unremarkable. No significant innominate or proximal subclavian artery stenosis. Right carotid system: CCA and ICA patent within the neck without stenosis. Minimal calcified plaque within the carotid bifurcation and proximal ICA. Left carotid system: CCA and ICA patent within the neck without significant stenosis (50% or greater). Mild mixed plaque within the carotid bifurcation and proximal ICA. Vertebral arteries: The right vertebral artery is dominant and  patent throughout the neck without significant stenosis. The non dominant left vertebral artery is developmentally diminutive but patent throughout the neck. Skeleton: Acute fracture of the anterolateral right second rib. Other neck: No neck mass or cervical lymphadenopathy. Upper chest: Extensive airspace disease within the partially imaged right lung apex. Partially imaged, there is a probable hydropneumothorax. Ill-defined ground-glass opacity is also present within the imaged left upper lobe. ET tube terminates above the level of the carina. Partially visualized enteric tube. Review of the MIP images confirms the above findings CTA HEAD FINDINGS Anterior circulation: The intracranial right internal carotid artery is patent with scattered calcified plaque. There is up to moderate stenosis within the paraclinoid segment. Otherwise no more than mild narrowing of this vessel. The intracranial left internal carotid artery is patent with scattered calcified plaque. No more than mild stenosis within this vessel. The M1 middle cerebral arteries are patent without significant stenosis. Marked attenuation of M2 and more distal right MCA branches with occlusion of multiple proximal right M2 branches. No left M2 proximal branch occlusion or high-grade proximal stenosis is identified. The anterior cerebral arteries are patent without high-grade proximal stenosis. No intracranial aneurysm is identified. Posterior circulation: The intracranial  vertebral arteries are patent. Calcified plaque within the V4 right vertebral artery with mild-to-moderate stenosis. The basilar artery is patent without significant stenosis. The posterior cerebral arteries are patent bilaterally without high-grade proximal stenosis. Sizable posterior communicating arteries are present bilaterally. Venous sinuses: Within limitations of contrast timing, no convincing thrombus. Anatomic variants: None significant Review of the MIP images confirms the  above findings IMPRESSION: CT head: 1. Continued interval demarcation of acute ischemic infarction changes throughout much of the right MCA vascular territory. No evidence of hemorrhagic conversion. No midline shift. 2. Age indeterminate infarcts in the bilateral cerebellar hemispheres. 3. Chronic ischemic changes within the cerebral white matter and pons. CTA neck: 1. The bilateral common carotid, internal carotid and vertebral arteries are patent within the neck without significant stenosis (50% or greater). Plaque within the left greater than right carotid bifurcations and proximal ICAs. 2. Partially visualized probable right-sided hydropneumothorax. Consider dedicated chest CT for confirmation. 3. Airspace disease within the right greater than left imaged lung apices. Findings may reflect aspiration/pneumonia or contusion. 4. Mildly displaced fracture of the anterolateral right second rib. CTA head: 1. Marked attenuation of multiple M2 and more distal right MCA branch vessels with occlusion of multiple proximal right M2 MCA branches. 2. No intracranial large vessel occlusion or proximal high-grade arterial stenosis identified elsewhere. 3. Calcified plaque within the intracranial internal carotid arteries. Up to moderate stenosis within the paraclinoid right ICA. Electronically Signed: By: Kellie Simmering DO On: 08/12/2019 14:10   MR BRAIN WO CONTRAST  Result Date: 08/12/2019 CLINICAL DATA:  Stroke, follow-up EXAM: MRI HEAD WITHOUT CONTRAST TECHNIQUE: Multiplanar, multiecho pulse sequences of the brain and surrounding structures were obtained without intravenous contrast. COMPARISON:  Correlation made with recent CT imaging FINDINGS: Brain: There is extensive restricted diffusion within the right cerebral hemisphere involving MCA and PCA territories. Additional multiple scattered small foci contralaterally involving the frontoparietal greater than occipital lobes. There also multiple small foci of restricted  diffusion within the cerebellar hemispheres and a small focus within the parasagittal right midbrain. There is no significant mass effect at this time. Focus of susceptibility within the left cerebellar hemisphere is compatible with chronic microhemorrhage. There is no intracranial mass, mass effect, or edema. There is no hydrocephalus or extra-axial fluid collection. Vascular: Major vessel flow voids at the skull base are preserved. Skull and upper cervical spine: Normal marrow signal is preserved. Sinuses/Orbits: Mild mucosal thickening.  Orbits are unremarkable. Other: Sella is unremarkable. Nonspecific mastoid fluid opacification. IMPRESSION: Acute infarctions involving multiple vascular territories bilaterally including a large right MCA territory infarction. No evidence of hemorrhagic conversion. There is no significant mass effect at this time. Electronically Signed   By: Macy Mis M.D.   On: 08/12/2019 13:59   DG CHEST PORT 1 VIEW  Result Date: 08/13/2019 CLINICAL DATA:  Hypoxia. EXAM: PORTABLE CHEST 1 VIEW COMPARISON:  Radiograph of same day. FINDINGS: Stable cardiomegaly. Endotracheal and nasogastric tubes are in grossly good position. Left-sided PICC line is unchanged. Stable position of right-sided chest tube is noted. Stable small right lateral pneumothorax is noted stable right lung opacities are noted concerning for pneumonia. Bony thorax is unremarkable. IMPRESSION: Stable support apparatus. Stable small right lateral pneumothorax. Stable right lung opacities concerning for pneumonia. Electronically Signed   By: Marijo Conception M.D.   On: 08/13/2019 13:54   DG CHEST PORT 1 VIEW  Result Date: 08/13/2019 CLINICAL DATA:  59 year old male status post chest tube placement. EXAM: PORTABLE CHEST 1 VIEW COMPARISON:  0057 hours  today. FINDINGS: Portable AP semi upright view at 0153 hours. A right chest tube is in place with mild to moderate right chest wall subcutaneous gas. Regressed right  pneumothorax and resolved leftward mediastinal shift. Small volume right pneumothorax is possible. Stable enteric tube, endotracheal tube, left PICC line. Coarse and patchy bilateral pulmonary opacity is stable. Stable contrast in nondistended splenic flexure. IMPRESSION: 1. Substantially improved status post right chest tube placement. Resolved mediastinal shift and small if any residual right pneumothorax. 2. Stable underlying bilateral pulmonary opacities. And otherwise stable lines and tubes. Electronically Signed   By: Genevie Ann M.D.   On: 08/13/2019 02:15   DG Chest Port 1 View  Addendum Date: 08/13/2019   ADDENDUM REPORT: 08/13/2019 01:26 ADDENDUM: Critical Value/emergent results were called by telephone at the time of interpretation on 08/13/2019 at 0124 hours to Dawson who advises that the patient's physician and RN are currently placing a chest tube. Electronically Signed   By: Genevie Ann M.D.   On: 08/13/2019 01:26   Result Date: 08/13/2019 CLINICAL DATA:  59 year old male with respiratory distress, hypoxia, pneumonia/ARDS. Recent cardiac arrest. EXAM: PORTABLE CHEST 1 VIEW COMPARISON:  Noncontrast chest CT 08/06/2019. Portable chest 08/12/2019 and earlier. FINDINGS: Portable AP semi upright view at 0057 hours. New large right pneumothorax with some leftward shift of the mediastinum. The patient remains intubated with endotracheal tube tip between the level the clavicles and carina. Collapse of the right lung medially. There is a nondisplaced fracture of the posterior right 10th rib now apparent. Left lung ventilation appears stable with coarse and confluent reticulonodular opacity. Stable left PICC line. Enteric tube position stable. Stable mediastinal contours. IMPRESSION: 1. New tension right pneumothorax in this intubated patient. 2. Stable left lung infection.  Stable lines and tubes. 3. Nondisplaced right 10th rib fracture incidentally noted. Electronically Signed: By: Genevie Ann M.D. On: 08/13/2019  01:19   DG CHEST PORT 1 VIEW  Result Date: 08/12/2019 CLINICAL DATA:  59 year old male on ventilation. EXAM: PORTABLE CHEST 1 VIEW COMPARISON:  Chest radiograph dated 08/11/2019. FINDINGS: There has been interval placement of a left-sided PICC with tip over central SVC. Endotracheal tube remains above the carina. Enteric tube extends below the diaphragm with tip in the proximal stomach. Bilateral airspace opacities relatively similar to prior radiograph. There is a cavitary lesion or a bulla in the right mid lung field as seen previously. No large pleural effusion no definite pneumothorax. Stable cardiomediastinal silhouette. No acute osseous pathology. IMPRESSION: Interval placement of a left-sided PICC with tip over central SVC. Otherwise no significant interval change in the appearance of the lungs. Continued follow-up recommended. Electronically Signed   By: Anner Crete M.D.   On: 08/12/2019 17:59   DG CHEST PORT 1 VIEW  Result Date: 08/11/2019 CLINICAL DATA:  59 year old male with cardiac arrest and concern for pneumothorax. EXAM: PORTABLE CHEST 1 VIEW COMPARISON:  Chest radiograph dated 08/11/2019. FINDINGS: Endotracheal and enteric tube in similar position. Bilateral airspace opacities as seen previously. No pneumothorax. Stable cardiomediastinal silhouette. No acute osseous pathology. IMPRESSION: No interval change.  No pneumothorax. Electronically Signed   By: Anner Crete M.D.   On: 08/11/2019 18:28   EEG adult  Result Date: 08/12/2019 Lora Havens, MD     08/12/2019  6:42 PM Patient Name: Pearce Littlefield MRN: [id number] Epilepsy Attending: Lora Havens Referring Physician/Provider: Dr Antony Contras Date: 08/12/2019 Duration: 29.04 mins Patient history: 59yo M admitted for respiratory distress, had cardiorespiratory arrest. EEG to evaluate for seizure.  Level of alertness: comatose AEDs during EEG study: None Technical aspects: This EEG study was done with scalp electrodes positioned  according to the 10-20 International system of electrode placement. Electrical activity was acquired at a sampling rate of _0  and reviewed with a high frequency filter of _1  and a low frequency filter of _2 . EEG data were recorded continuously and digitally stored. DESCRIPTION:  EEG showed continuous generalized low amplitude 2-_3  delta slowing. EEG was not reactive to noxious stimulation. Hyperventilation and photic stimulation were not performed. ABNORMALITY - Continuous slow, generalized IMPRESSION: This study is suggestive of profound diffuse encephalopathy, non specific to etiology. No seizures or epileptiform discharges were seen throughout the recording. Priyanka Barbra Sarks Priyanka Barbra Sarks   ECHOCARDIOGRAM LIMITED BUBBLE STUDY  Result Date: 08/13/2019    ECHOCARDIOGRAM LIMITED REPORT   Patient Name:   PATRICH HEINZE Date of Exam: 08/13/2019 Medical Rec #:  [id number]   Height:       73.0 in Accession #:    [id number]  Weight:       144.3 lb Date of Birth:  1961/01/12   BSA:          1.873 m Patient Age:    35 years    BP:           99/68 mmHg Patient Gender: M           HR:           116 bpm. Exam Location:  Inpatient Procedure: Limited Color Doppler and Saline Contrast Bubble Study Indications:    Stroke I63.9; New stroke, RLE DVT, r/o PFO. Also post arrest,                 evaluate EF.  History:        Patient has prior history of Echocardiogram examinations, most                 recent 07/26/2019. Risk Factors:Hypertension.  Sonographer:    Mikki Santee RDCS (AE) Referring Phys: [id number] [place]  1. Limited study; doppler not performed; normal LV systolic function; probable mild RAE and RVE; negative saline microcavitation study; possible oscillating density in LVOT; suggest TEE to further assess.  2. Left ventricular ejection fraction, by estimation, is 55 to 60%. The left ventricle has normal function. The left ventricle has no regional wall motion abnormalities.  3. The right  ventricular size is mildly enlarged.  4. Right atrial size was mildly dilated.  5. Agitated saline contrast bubble study was negative, with no evidence of any interatrial shunt. FINDINGS  Left Ventricle: Left ventricular ejection fraction, by estimation, is 55 to 60%. The left ventricle has normal function. The left ventricle has no regional wall motion abnormalities. The left ventricular internal cavity size was normal in size. Right Ventricle: The right ventricular size is mildly enlarged. Left Atrium: Left atrial size was normal in size. Right Atrium: Right atrial size was mildly dilated. Pericardium: Trivial pericardial effusion is present. IAS/Shunts: Agitated saline contrast was given intravenously to evaluate for intracardiac shunting. Agitated saline contrast bubble study was negative, with no evidence of any interatrial shunt. Additional Comments: Limited study; doppler not performed; normal LV systolic function; probable mild RAE and RVE; negative saline microcavitation study; possible oscillating density in LVOT; suggest TEE to further assess. Kirk Ruths MD Electronically signed by Kirk Ruths MD Signature Date/Time: 08/13/2019/12:06:41 PM    Final    [place] EKG SITE RITE  Result Date: 08/11/2019 If Site [place]  not attached, placement could not be confirmed due to current cardiac rhythm.    Assessment/Plan: Interstitial pneumonia = unclear the etiology. Awaiting PJP stain. Continue on IV bactrim  Secondary e.faecalis infection/pneumonia = continue on amp/sub. Will d/c anidulafungin  Right sided hydropneumothorax = continue on chest tube  Devastating right mca stroke = having little response to verbal stimuli is concerning. Continue to monitor for improvement.  [facility] Infectious Diseases Cell: [phone] Pager: [phone]  08/13/2019, 3:59 PM

## 2019-08-13 NOTE — Progress Notes (Signed)
Inpatient Rehabilitation Admissions Coordinator  Noted events over the weekend. Vented. Therapy has signed off at this time. I will sign off at this time. Please reconsult if appropriate.  Ottie Glazier, RN, MSN Rehab Admissions Coordinator [phone] 08/13/2019 4:30 PM

## 2019-08-13 NOTE — Progress Notes (Signed)
Nutrition Follow-up  DOCUMENTATION CODES:   Underweight, Severe malnutrition in context of chronic illness  INTERVENTION:   Tube Feeding:  Osmolite 1.2 at 60 ml/hr Pro-Sat 30 mL daily Provides 95 g of protein, 1828 kcals and 1166 ml of free water Meets 100% estimated calorie and protein needs  Recommend re-insertion of Cortrak tube prior to any attempted extubation as pt will likely continued enteral nutrition post extubation  NUTRITION DIAGNOSIS:   Severe Malnutrition related to chronic illness as evidenced by severe fat depletion, severe muscle depletion.  Being addressed via TF   GOAL:   Patient will meet greater than or equal to 90% of their needs  Progressing  MONITOR:   PO intake, Supplement acceptance, Weight trends, Labs, I & O's  REASON FOR ASSESSMENT:   Malnutrition Screening Tool    ASSESSMENT:   Patient with PMH significant for HTN. Presents this admission with hyponatremia.  3/05 Cortrak insertion, TF initiated 3/06 Cardiac arrest  3/07 Chest tube for R. Tension pneumo  Patient is currently intubated on ventilator support MV: 16 L/min Temp (24hrs), Avg:98.2 F (36.8 C), Min:97.9 F (36.6 C), Max:98.7 F (37.1 C)  Osmolite 1.2 at 40 ml/hr via OG tube, noted Cortrak pulled out by patient on 3/6  Labs: reviewed Meds: solumedrol, thiamine, liquid MCI, reglan prn    Diet Order:   Diet Order            Diet NPO time specified  Diet effective now        Diet - low sodium heart healthy              EDUCATION NEEDS:   Not appropriate for education at this time  Skin:  Skin Assessment: Reviewed RN Assessment  Last BM:  2/28  Height:   Ht Readings from Last 1 Encounters:  08-10-2019 6\' 1"  (1.854 m)    Weight:   Wt Readings from Last 1 Encounters:  08/13/19 65.5 kg    Ideal Body Weight:  83.6 kg  BMI:  Body mass index is 19.04 kg/m.  Estimated Nutritional Needs:   Kcal:  1900-2100 kcals  Protein:  90-110 g  Fluid:   >/= 2 L/day   10/13/19 MS, RDN, LDN, CNSC RD Pager Number and Weekend/On-Call After Hours Pager Located in [place]

## 2019-08-13 NOTE — Progress Notes (Signed)
PULMONARY / CRITICAL CARE MEDICINE   NAME:  Shelly Shoultz, MRN:  [id number], DOB:  Feb 13, 1961, LOS: 30 ADMISSION DATE:  08/01/2019, CONSULTATION DATE:  07/30/2019 REFERRING MD:  Grandville Silos, CHIEF COMPLAINT:  Hypoxemic respiratory failure  BRIEF HISTORY:    The patient is a 59 year old gentleman who presented 07/30/2019 with hiccups, loss of appetite, hyponatremia, watery diarrhea.  He was also short of breath on exertion.  He had a grossly abnormal chest x-ray and was found to be incidentally hypoxemic.  He was seen initially by pulmonary critical care team and treated empirically for community-acquired pneumonia.  However over the last week he has continued to decline, had worsening hypoxemia, and had a repeat CT chest which shows persistent bilateral lower lobe predominant groundglass opacities with interlobular septal thickening.  Is being called back to evaluate these opacities in the setting of hypoxemic respiratory failure.  SIGNIFICANT PAST MEDICAL HISTORY   Hypertension  SIGNIFICANT EVENTS:  2/22: PCCM consulted and recommended for CAP coverage 3/1: PCCM consulted for persistent infiltrates and hypoxemia, planned for bronch AM of 3/2 for which transferred to ICU; bronch cancelled for worsening O2 requirements  3/6 intubated for bronchoscopy 3/6 cardiac arrest overnight  3/7 chest tube placed for R tension pneumothorax  STUDIES:   2/17: CT Chest wo Contrast: multifocal pneumonia, viral vs atypical; mild aneurysmal dilation of ascending aorta up to 4.2cm  2/18: CT Angio Chest: No PE; nonspecific multifocal pneumonia of atypical etiology 3/1: CT Chest wo Contrast: extensive worsening crazy paving pattern (ground glass opacity + interlobular septal thickening) throughout peripheral lungs bilaterally involving all lung lobes; new patchy regions of consolidative airspace disease in lungs bilaterally; most prominent in lower lobes. Given interval worsening, considerations include acute interstitial  pneumonia/ARDS, atypical infection or pulmonary alveolar proteinosis; small dependent bilateral pleural effusions; dilated main pulmonary artery suggesting pulmonary arterial hypertension; stable ascending thoracic aorta aneurysm.   CULTURES:  Cultures no growth to date Respiratory virus panel negative MRSA PCR negative Covid negative 3/3: Blood cultures x2> neg to date 3/3: Urine legionella>  3/3: Urine strep>  3/6: BAL>> E faecalis. 3/6 pneumocystis smear>> 3/6 BAL AFB >> 3/6 BAL fungus>  ANTIBIOTICS:  Vancomycin 2/22> 3/2 Cefepime 2/25> 3/2 Bactrim 3/2 Azithromycin 3/2 Unasyn 3/7>> anidulafingin 3/7 Ceftriaxone 3/7 Bactrim 3/7  CONSULTANTS:  PCCM Neurology ID  SUBJECTIVE:   Require chest tube overnight for tension pneumothorax.  CONSTITUTIONAL: BP 106/78   Pulse (!) 118   Temp 98.7 F (37.1 C) (Oral)   Resp (!) 32   Ht _0  (1.854 m)   Wt 65.5 kg   SpO2 96%   BMI 19.04 kg/m   I/O last 3 completed shifts: In: 5011 [I.V.:964.2; NG/GT:902; IV Piggyback:3144.8] Out: 5188 [Urine:1450; Chest Tube:320]  Vent Mode: PRVC FiO2 (%):  [50 %-100 %] 50 % Set Rate:  [22 bmp] 22 bmp Vt Set:  [48 mL-480 mL] 48 mL PEEP:  [8 cmH20-10 cmH20] 8 cmH20  CBC Latest Ref Rng & Units 08/13/2019 08/13/2019 08/12/2019  WBC 4.0 - 10.5 K/uL 42.3(H) - 60.5(HH)  Hemoglobin 13.0 - 17.0 g/dL 7.0(L) 7.5(L) 9.3(L)  Hematocrit 39.0 - 52.0 % 22.4(L) 22.0(L) 29.2(L)  Platelets 150 - 400 K/uL 187 - 201   CMP Latest Ref Rng & Units 08/13/2019 08/13/2019 08/13/2019  Glucose 70 - 99 mg/dL - - -  BUN 6 - 20 mg/dL - - -  Creatinine 0.61 - 1.24 mg/dL 1.43(H) - -  Sodium 135 - 145 mmol/L - - -  Potassium 3.5 - 5.1 mmol/L -  4.8 4.7  Chloride 98 - 111 mmol/L - - -  CO2 22 - 32 mmol/L - - -  Calcium 8.9 - 10.3 mg/dL - - -  Total Protein 6.5 - 8.1 g/dL - - -  Total Bilirubin 0.3 - 1.2 mg/dL - - -  Alkaline Phos 38 - 126 U/L - - -  AST 15 - 41 U/L - - -  ALT 0 - 44 U/L - - -   PHYSICAL  EXAM: General: Frail-appearing, chronically ill-appearing elderly man lying in bed no acute distress Neuro: Off sedation, eyes open but not tracking.  PERRL, blinks to threat.  Not withdrawing from pain in any extremity. HEENT: [place]/AT, eyes anicteric Cardiovascular: Tachycardic, regular rhythm Lungs: Right-sided inspiratory squeaking breath sounds.  Right-sided chest tube with small amount of bloody drainage.  1+ leak with every breath.  About 50 cc tidal volume decreased from inspiratory to expiratory return. Abdomen: Soft, nontender, nondistended Musculoskeletal: RLE edema Skin: No rashes or wounds  CXR 3/8 personally reviewed-improved aeration throughout the right lung, incompletely reexpanded.  Pleural catheter in appropriate position.  Persistent airspace disease bilaterally.  ETT about 2 cm above the carina.  RESOLVED PROBLEM LIST   ASSESSMENT AND PLAN    Asystolic cardiac arrest, PEA cardiac arrest. 14 min code.  Due to concern for PE from DVT, was given TPA postcode.  -Continue to support and observe for neurologic recovery -Appreciate neurology's input -Brain MRI when stable from respiratory standpoint.  He was previously unable to while on inhaled nitric oxide.  Increasing FiO2 this morning limits the ability to safely do this. -Per neurology okay to keep continuous heparin  Shock state- hypotension.  Likely septic versus less likely cardiogenic (LVEF 55 to 60%, mildly enlarged RV & RA-unclear if this is from a PE or acute hypoxic respiratory failure induced hypoxic vasoconstriction). Not currently on sedation. -Vasopressors as required to maintain MAP greater than 65  Abnormal echocardiogram-possible mobile density in LVOT -Consult cardiology for possible TEE for further evaluation--tentatively planning for Tuesday  Acute next hypoxemic hypercarbic respiratory failure  Acute interstitial pneumonia versus alveolar hemorrhage. Differential diagnosis follows that of "crazy  paving" Areas of groundglass with interlobular septal thickening. Now with right-sided pneumothorax, continuous air leak. -Continue full vent support-LTVV, 4 to 8 cc/kg ideal body weight with goal plateau less than 30 and driving pressure less than 15. -Daily SAT.  SBT when appropriate for mental status standpoint -VAP prevention protocol -Continue broad-spectrum antibiotics for possible pneumonia -Pneumocystis smear pending, GM-CSF level sent out to evaluate for PAP.  Assuming neurologic recovery, would require whole lung lavage for PAP. -Continue chest tube to -20 cm water.  Continue monitoring airleak.  Flush chest tube per protocol. -Repeat CXR for increasing oxygen requirements -Continue steroids  R acute MCA stroke- presumed embolic.  Postcode EEG with profound diffuse encephalopathy, but no seizures or epileptiform discharges.  Early edema on CT head. -Appreciate neurology's assistance -Brain MRI when stable from a pulmonary standpoint -Okay to continue anticoagulation for DVT, possible PE  Hyperkalemia, AKI. Treated with insulin D50 calcium and Kayexalate -Repeat labs this afternoon  Acute metabolic encephalopathy likely 2/2 anoxic brain injury, large CVA -Continue to monitor and limit sedation  Severe protein calorie malnutrition -Continue tube feeds  Leukocytosis w/bandemia: Continue broad-spectrum antibiotics -Continue to follow cultures  Acute right lower extremity DVT -Restarting heparin  Acute normocytic anemia; likely due to critical illness, blood loss from chest tube -Continue to monitor chest tube output -Cautiously restarting heparin -Repeat CBC -Type  and cross previously sent, will transfuse if hemoglobin less than 7  Guarded prognosis.  Best Practice / Goals of Care / Disposition.   Diet: regular diet; will need tube feeds given inadequate PO intake Pain/Anxiety/Delirium protocol (if indicated): n/a VAP protocol (if indicated):n/a DVT prophylaxis:  lovenox GI prophylaxis: Protonix Glucose control: monitor CBG Foley condom cath Mobility: bed rest Code Status: Full Family Communication:  Disposition: ICU  LABS  Glucose Recent Labs  Lab 08/12/19 1717 08/12/19 2003 08/13/19 0006 08/13/19 0353 08/13/19 0813 08/13/19 1140  GLUCAP 199* 169* 248* 142* 142* 125*    BMET Recent Labs  Lab 08/11/19 0345 08/11/19 1313 08/12/19 0318 08/12/19 0318 08/12/19 0518 08/12/19 0701 08/13/19 0045 08/13/19 0109 08/13/19 0439 08/13/19 0825  NA 139   < > 142  --  142  --  141  --   --   --   K 4.6   < > 6.7*   < > 6.5*   < > 4.8 4.7 4.8  --   CL 107  --  108  --  108  --   --   --   --   --   CO2 24  --  22  --  27  --   --   --   --   --   BUN 33*  --  39*  --  42*  --   --   --   --   --   CREATININE 0.76  --  1.17  --  1.26*  --   --   --   --  1.43*  GLUCOSE 112*  --  117*  --  118*  --   --   --   --   --    < > = values in this interval not displayed.    Liver Enzymes Recent Labs  Lab 08/10/19 0556 08/11/19 0345 08/12/19 0318  AST 34 37 60*  ALT 27 34 53*  ALKPHOS 75 78 95  BILITOT 0.6 0.5 0.7  ALBUMIN 1.6* 1.7* 1.8*    Electrolytes Recent Labs  Lab 08/10/19 0556 08/10/19 1700 08/11/19 0345 08/11/19 2036 08/12/19 0318 08/12/19 0518  CALCIUM   < >  --  8.8*  --  8.6* 8.7*  MG   < > 1.9 1.9 2.0 2.1  --   PHOS  --  2.6 3.3 7.1*  --   --    < > = values in this interval not displayed.    CBC Recent Labs  Lab 08/11/19 2036 08/11/19 2036 08/12/19 0318 08/13/19 0045 08/13/19 0439  WBC 48.4*  --  60.5*  --  42.3*  HGB 9.0*   < > 9.3* 7.5* 7.0*  HCT 28.0*   < > 29.2* 22.0* 22.4*  PLT 195  --  201  --  187   < > = values in this interval not displayed.    ABG Recent Labs  Lab 08/10/19 0419 08/11/19 1313 08/13/19 0045  PHART 7.417 7.185* 7.278*  PCO2ART 42.4 75.9* 58.0*  PO2ART 189.0* 229.0* 51.0*    Coag's Recent Labs  Lab 08/11/19 2036  APTT 30  INR 1.5*    Sepsis Markers Recent  Labs  Lab 08/08/19 0248  PROCALCITON 3.64    Cardiac Enzymes No results for input(s): TROPONINI, PROBNP in the last 168 hours.  This patient is critically ill with multiple organ system failure which requires frequent high complexity decision making, assessment, support, evaluation, and titration of therapies. This was  completed through the application of advanced monitoring technologies and extensive interpretation of multiple databases. During this encounter critical care time was devoted to patient care services described in this note for 60 minutes.  Julian Hy, DO 08/13/19 1:02 PM [facility]

## 2019-08-13 NOTE — Progress Notes (Signed)
STROKE TEAM PROGRESS NOTE   INTERVAL HISTORY RN at bedside. Pt still intubated, non-responsive, on levophed and heparin IV. Has agonal breathing even on vent. Family is coming from [place] and [place] in 1-2 days. Recommend palliative care to discuss GOC.    OBJECTIVE Vitals:   08/13/19 0930 08/13/19 0945 08/13/19 1000 08/13/19 1015  BP: (!) 117/59 119/73 119/70 94/67  Pulse: (!) 117 (!) 117 (!) 118 (!) 122  Resp: (!) 31 (!) 35 (!) 28 (!) 25  Temp:      TempSrc:      SpO2: 95% 94% 95% 94%  Weight:      Height:        CBC:  Recent Labs  Lab 08/11/19 0345 08/11/19 1313 08/12/19 0318 08/12/19 0318 08/13/19 0045 08/13/19 0439  WBC 21.2*   < > 60.5*  --   --  42.3*  NEUTROABS 17.5*  --  51.3*  --   --   --   HGB 6.9*   < > 9.3*   < > 7.5* 7.0*  HCT 20.5*   < > 29.2*   < > 22.0* 22.4*  MCV 92.8   < > 99.0  --   --  100.0  PLT 187   < > 201  --   --  187   < > = values in this interval not displayed.    Basic Metabolic Panel:  Recent Labs  Lab 08/11/19 0345 08/11/19 0345 08/11/19 1313 08/11/19 2036 08/12/19 1610 08/12/19 9604 08/12/19 0518 08/12/19 0701 08/13/19 0045 08/13/19 0045 08/13/19 0109 08/13/19 0439 08/13/19 0825  NA 139   < >   < >  --  142   < > 142  --  141  --   --   --   --   K 4.6   < >   < >  --  6.7*   < > 6.5*   < > 4.8   < > 4.7 4.8  --   CL 107   < >  --   --  108  --  108  --   --   --   --   --   --   CO2 24   < >  --   --  22  --  27  --   --   --   --   --   --   GLUCOSE 112*   < >  --   --  117*  --  118*  --   --   --   --   --   --   BUN 33*   < >  --   --  39*  --  42*  --   --   --   --   --   --   CREATININE 0.76   < >  --   --  1.17   < > 1.26*  --   --   --   --   --  1.43*  CALCIUM 8.8*   < >  --   --  8.6*  --  8.7*  --   --   --   --   --   --   MG 1.9  --   --  2.0 2.1  --   --   --   --   --   --   --   --   PHOS 3.3  --   --  7.1*  --   --   --   --   --   --   --   --   --    < > = values in this interval not displayed.   Lipid  Panel:     Component Value Date/Time   CHOL 90 08/13/2019 0439   TRIG 112 08/13/2019 0439   HDL 32 (L) 08/13/2019 0439   CHOLHDL 2.8 08/13/2019 0439   VLDL 22 08/13/2019 0439   LDLCALC 36 08/13/2019 0439   HgbA1c:  Lab Results  Component Value Date   HGBA1C 6.0 (H) 08/13/2019   Urine Drug Screen:     Component Value Date/Time   LABOPIA NONE DETECTED 08/11/2019 1328   COCAINSCRNUR NONE DETECTED 08/11/2019 1328   LABBENZ NONE DETECTED 08/11/2019 1328   AMPHETMU NONE DETECTED 08/11/2019 1328   THCU POSITIVE (A) 08/11/2019 1328   LABBARB NONE DETECTED 08/11/2019 1328    Alcohol Level     Component Value Date/Time   ETH <10 07/12/2019 1633    IMAGING past 24h CT ANGIO HEAD W OR WO CONTRAST  Addendum Date: 08/12/2019   ADDENDUM REPORT: 08/12/2019 14:57 ADDENDUM: These results were called by telephone at the time of interpretation on 08/12/2019 at 2:17 pm to provider PRAMOD SETHI , who verbally acknowledged these results. Electronically Signed   By: Kellie Simmering DO   On: 08/12/2019 14:57   Result Date: 08/12/2019 CLINICAL DATA:  Stroke, follow-up. EXAM: CT ANGIOGRAPHY HEAD AND NECK TECHNIQUE: Multidetector CT imaging of the head and neck was performed using the standard protocol during bolus administration of intravenous contrast. Multiplanar CT image reconstructions and MIPs were obtained to evaluate the vascular anatomy. Carotid stenosis measurements (when applicable) are obtained utilizing NASCET criteria, using the distal internal carotid diameter as the denominator. CONTRAST:  57mL OMNIPAQUE IOHEXOL 350 MG/ML SOLN COMPARISON:  Noncontrast CT head 08/12/2019 FINDINGS: CT HEAD FINDINGS Brain: Continued interval demarcation of abnormal cortical/subcortical hypodensity throughout much of the right MCA vascular territory consistent with acute infarction. There is most notable involvement of the right frontal and parietal lobes with the relative sparing of the right temporal lobe and right  basal ganglia. No evidence of hemorrhagic conversion. Mild mass effect with effacement of right-sided cerebral sulci. No significant effacement of the ventricular system. No midline shift. Redemonstrated chronic small-vessel ischemic changes within the pons. Ill-defined hypoattenuation within the cerebral white matter is nonspecific, but consistent with chronic small vessel ischemic disease. Chronic small vessel ischemic changes again demonstrated within the pons. Redemonstrated age-indeterminate infarcts in the bilateral cerebellar hemispheres. Unchanged mild generalized parenchymal atrophy. Vascular: Reported separately. Skull: Normal. Negative for fracture or focal lesion. Sinuses: Left maxillary sinus mucous retention cyst. Small right mastoid effusion. Orbits: Visualized orbits demonstrate no acute abnormality. Review of the MIP images confirms the above findings CTA NECK FINDINGS Mildly motion degraded examination. Aortic arch: Standard aortic branching. The visualized aortic arch is unremarkable. No significant innominate or proximal subclavian artery stenosis. Right carotid system: CCA and ICA patent within the neck without stenosis. Minimal calcified plaque within the carotid bifurcation and proximal ICA. Left carotid system: CCA and ICA patent within the neck without significant stenosis (50% or greater). Mild mixed plaque within the carotid bifurcation and proximal ICA. Vertebral arteries: The right vertebral artery is dominant and patent throughout the neck without significant stenosis. The non dominant left vertebral artery is developmentally diminutive but patent throughout the neck. Skeleton: Acute fracture of the anterolateral right second rib. Other neck: No neck  mass or cervical lymphadenopathy. Upper chest: Extensive airspace disease within the partially imaged right lung apex. Partially imaged, there is a probable hydropneumothorax. Ill-defined ground-glass opacity is also present within the  imaged left upper lobe. ET tube terminates above the level of the carina. Partially visualized enteric tube. Review of the MIP images confirms the above findings CTA HEAD FINDINGS Anterior circulation: The intracranial right internal carotid artery is patent with scattered calcified plaque. There is up to moderate stenosis within the paraclinoid segment. Otherwise no more than mild narrowing of this vessel. The intracranial left internal carotid artery is patent with scattered calcified plaque. No more than mild stenosis within this vessel. The M1 middle cerebral arteries are patent without significant stenosis. Marked attenuation of M2 and more distal right MCA branches with occlusion of multiple proximal right M2 branches. No left M2 proximal branch occlusion or high-grade proximal stenosis is identified. The anterior cerebral arteries are patent without high-grade proximal stenosis. No intracranial aneurysm is identified. Posterior circulation: The intracranial vertebral arteries are patent. Calcified plaque within the V4 right vertebral artery with mild-to-moderate stenosis. The basilar artery is patent without significant stenosis. The posterior cerebral arteries are patent bilaterally without high-grade proximal stenosis. Sizable posterior communicating arteries are present bilaterally. Venous sinuses: Within limitations of contrast timing, no convincing thrombus. Anatomic variants: None significant Review of the MIP images confirms the above findings IMPRESSION: CT head: 1. Continued interval demarcation of acute ischemic infarction changes throughout much of the right MCA vascular territory. No evidence of hemorrhagic conversion. No midline shift. 2. Age indeterminate infarcts in the bilateral cerebellar hemispheres. 3. Chronic ischemic changes within the cerebral white matter and pons. CTA neck: 1. The bilateral common carotid, internal carotid and vertebral arteries are patent within the neck without  significant stenosis (50% or greater). Plaque within the left greater than right carotid bifurcations and proximal ICAs. 2. Partially visualized probable right-sided hydropneumothorax. Consider dedicated chest CT for confirmation. 3. Airspace disease within the right greater than left imaged lung apices. Findings may reflect aspiration/pneumonia or contusion. 4. Mildly displaced fracture of the anterolateral right second rib. CTA head: 1. Marked attenuation of multiple M2 and more distal right MCA branch vessels with occlusion of multiple proximal right M2 MCA branches. 2. No intracranial large vessel occlusion or proximal high-grade arterial stenosis identified elsewhere. 3. Calcified plaque within the intracranial internal carotid arteries. Up to moderate stenosis within the paraclinoid right ICA. Electronically Signed: By: Jackey Loge DO On: 08/12/2019 14:10   CT ANGIO NECK W OR WO CONTRAST  Addendum Date: 08/12/2019   ADDENDUM REPORT: 08/12/2019 14:57 ADDENDUM: These results were called by telephone at the time of interpretation on 08/12/2019 at 2:17 pm to provider PRAMOD SETHI , who verbally acknowledged these results. Electronically Signed   By: Jackey Loge DO   On: 08/12/2019 14:57   Result Date: 08/12/2019 CLINICAL DATA:  Stroke, follow-up. EXAM: CT ANGIOGRAPHY HEAD AND NECK TECHNIQUE: Multidetector CT imaging of the head and neck was performed using the standard protocol during bolus administration of intravenous contrast. Multiplanar CT image reconstructions and MIPs were obtained to evaluate the vascular anatomy. Carotid stenosis measurements (when applicable) are obtained utilizing NASCET criteria, using the distal internal carotid diameter as the denominator. CONTRAST:  5mL OMNIPAQUE IOHEXOL 350 MG/ML SOLN COMPARISON:  Noncontrast CT head 08/12/2019 FINDINGS: CT HEAD FINDINGS Brain: Continued interval demarcation of abnormal cortical/subcortical hypodensity throughout much of the right MCA vascular  territory consistent with acute infarction. There is most notable involvement of  the right frontal and parietal lobes with the relative sparing of the right temporal lobe and right basal ganglia. No evidence of hemorrhagic conversion. Mild mass effect with effacement of right-sided cerebral sulci. No significant effacement of the ventricular system. No midline shift. Redemonstrated chronic small-vessel ischemic changes within the pons. Ill-defined hypoattenuation within the cerebral white matter is nonspecific, but consistent with chronic small vessel ischemic disease. Chronic small vessel ischemic changes again demonstrated within the pons. Redemonstrated age-indeterminate infarcts in the bilateral cerebellar hemispheres. Unchanged mild generalized parenchymal atrophy. Vascular: Reported separately. Skull: Normal. Negative for fracture or focal lesion. Sinuses: Left maxillary sinus mucous retention cyst. Small right mastoid effusion. Orbits: Visualized orbits demonstrate no acute abnormality. Review of the MIP images confirms the above findings CTA NECK FINDINGS Mildly motion degraded examination. Aortic arch: Standard aortic branching. The visualized aortic arch is unremarkable. No significant innominate or proximal subclavian artery stenosis. Right carotid system: CCA and ICA patent within the neck without stenosis. Minimal calcified plaque within the carotid bifurcation and proximal ICA. Left carotid system: CCA and ICA patent within the neck without significant stenosis (50% or greater). Mild mixed plaque within the carotid bifurcation and proximal ICA. Vertebral arteries: The right vertebral artery is dominant and patent throughout the neck without significant stenosis. The non dominant left vertebral artery is developmentally diminutive but patent throughout the neck. Skeleton: Acute fracture of the anterolateral right second rib. Other neck: No neck mass or cervical lymphadenopathy. Upper chest: Extensive  airspace disease within the partially imaged right lung apex. Partially imaged, there is a probable hydropneumothorax. Ill-defined ground-glass opacity is also present within the imaged left upper lobe. ET tube terminates above the level of the carina. Partially visualized enteric tube. Review of the MIP images confirms the above findings CTA HEAD FINDINGS Anterior circulation: The intracranial right internal carotid artery is patent with scattered calcified plaque. There is up to moderate stenosis within the paraclinoid segment. Otherwise no more than mild narrowing of this vessel. The intracranial left internal carotid artery is patent with scattered calcified plaque. No more than mild stenosis within this vessel. The M1 middle cerebral arteries are patent without significant stenosis. Marked attenuation of M2 and more distal right MCA branches with occlusion of multiple proximal right M2 branches. No left M2 proximal branch occlusion or high-grade proximal stenosis is identified. The anterior cerebral arteries are patent without high-grade proximal stenosis. No intracranial aneurysm is identified. Posterior circulation: The intracranial vertebral arteries are patent. Calcified plaque within the V4 right vertebral artery with mild-to-moderate stenosis. The basilar artery is patent without significant stenosis. The posterior cerebral arteries are patent bilaterally without high-grade proximal stenosis. Sizable posterior communicating arteries are present bilaterally. Venous sinuses: Within limitations of contrast timing, no convincing thrombus. Anatomic variants: None significant Review of the MIP images confirms the above findings IMPRESSION: CT head: 1. Continued interval demarcation of acute ischemic infarction changes throughout much of the right MCA vascular territory. No evidence of hemorrhagic conversion. No midline shift. 2. Age indeterminate infarcts in the bilateral cerebellar hemispheres. 3. Chronic  ischemic changes within the cerebral white matter and pons. CTA neck: 1. The bilateral common carotid, internal carotid and vertebral arteries are patent within the neck without significant stenosis (50% or greater). Plaque within the left greater than right carotid bifurcations and proximal ICAs. 2. Partially visualized probable right-sided hydropneumothorax. Consider dedicated chest CT for confirmation. 3. Airspace disease within the right greater than left imaged lung apices. Findings may reflect aspiration/pneumonia or contusion. 4. Mildly displaced  fracture of the anterolateral right second rib. CTA head: 1. Marked attenuation of multiple M2 and more distal right MCA branch vessels with occlusion of multiple proximal right M2 MCA branches. 2. No intracranial large vessel occlusion or proximal high-grade arterial stenosis identified elsewhere. 3. Calcified plaque within the intracranial internal carotid arteries. Up to moderate stenosis within the paraclinoid right ICA. Electronically Signed: By: Jackey Loge DO On: 08/12/2019 14:10   MR BRAIN WO CONTRAST  Result Date: 08/12/2019 CLINICAL DATA:  Stroke, follow-up EXAM: MRI HEAD WITHOUT CONTRAST TECHNIQUE: Multiplanar, multiecho pulse sequences of the brain and surrounding structures were obtained without intravenous contrast. COMPARISON:  Correlation made with recent CT imaging FINDINGS: Brain: There is extensive restricted diffusion within the right cerebral hemisphere involving MCA and PCA territories. Additional multiple scattered small foci contralaterally involving the frontoparietal greater than occipital lobes. There also multiple small foci of restricted diffusion within the cerebellar hemispheres and a small focus within the parasagittal right midbrain. There is no significant mass effect at this time. Focus of susceptibility within the left cerebellar hemisphere is compatible with chronic microhemorrhage. There is no intracranial mass, mass effect,  or edema. There is no hydrocephalus or extra-axial fluid collection. Vascular: Major vessel flow voids at the skull base are preserved. Skull and upper cervical spine: Normal marrow signal is preserved. Sinuses/Orbits: Mild mucosal thickening.  Orbits are unremarkable. Other: Sella is unremarkable. Nonspecific mastoid fluid opacification. IMPRESSION: Acute infarctions involving multiple vascular territories bilaterally including a large right MCA territory infarction. No evidence of hemorrhagic conversion. There is no significant mass effect at this time. Electronically Signed   By: Guadlupe Spanish M.D.   On: 08/12/2019 13:59   DG CHEST PORT 1 VIEW  Result Date: 08/13/2019 CLINICAL DATA:  59 year old male status post chest tube placement. EXAM: PORTABLE CHEST 1 VIEW COMPARISON:  0057 hours today. FINDINGS: Portable AP semi upright view at 0153 hours. A right chest tube is in place with mild to moderate right chest wall subcutaneous gas. Regressed right pneumothorax and resolved leftward mediastinal shift. Small volume right pneumothorax is possible. Stable enteric tube, endotracheal tube, left PICC line. Coarse and patchy bilateral pulmonary opacity is stable. Stable contrast in nondistended splenic flexure. IMPRESSION: 1. Substantially improved status post right chest tube placement. Resolved mediastinal shift and small if any residual right pneumothorax. 2. Stable underlying bilateral pulmonary opacities. And otherwise stable lines and tubes. Electronically Signed   By: Odessa Fleming M.D.   On: 08/13/2019 02:15   DG Chest Port 1 View  Addendum Date: 08/13/2019   ADDENDUM REPORT: 08/13/2019 01:26 ADDENDUM: Critical Value/emergent results were called by telephone at the time of interpretation on 08/13/2019 at 0124 hours to RN Sheri who advises that the patient's physician and RN are currently placing a chest tube. Electronically Signed   By: Odessa Fleming M.D.   On: 08/13/2019 01:26   Result Date: 08/13/2019 CLINICAL DATA:   59 year old male with respiratory distress, hypoxia, pneumonia/ARDS. Recent cardiac arrest. EXAM: PORTABLE CHEST 1 VIEW COMPARISON:  Noncontrast chest CT 08/06/2019. Portable chest 08/12/2019 and earlier. FINDINGS: Portable AP semi upright view at 0057 hours. New large right pneumothorax with some leftward shift of the mediastinum. The patient remains intubated with endotracheal tube tip between the level the clavicles and carina. Collapse of the right lung medially. There is a nondisplaced fracture of the posterior right 10th rib now apparent. Left lung ventilation appears stable with coarse and confluent reticulonodular opacity. Stable left PICC line. Enteric tube position stable. Stable  mediastinal contours. IMPRESSION: 1. New tension right pneumothorax in this intubated patient. 2. Stable left lung infection.  Stable lines and tubes. 3. Nondisplaced right 10th rib fracture incidentally noted. Electronically Signed: By: Odessa FlemingH  Hall M.D. On: 08/13/2019 01:19   DG CHEST PORT 1 VIEW  Result Date: 08/12/2019 CLINICAL DATA:  59 year old male on ventilation. EXAM: PORTABLE CHEST 1 VIEW COMPARISON:  Chest radiograph dated 08/11/2019. FINDINGS: There has been interval placement of a left-sided PICC with tip over central SVC. Endotracheal tube remains above the carina. Enteric tube extends below the diaphragm with tip in the proximal stomach. Bilateral airspace opacities relatively similar to prior radiograph. There is a cavitary lesion or a bulla in the right mid lung field as seen previously. No large pleural effusion no definite pneumothorax. Stable cardiomediastinal silhouette. No acute osseous pathology. IMPRESSION: Interval placement of a left-sided PICC with tip over central SVC. Otherwise no significant interval change in the appearance of the lungs. Continued follow-up recommended. Electronically Signed   By: Elgie CollardArash  Radparvar M.D.   On: 08/12/2019 17:59   EEG adult  Result Date: 08/12/2019 Charlsie QuestYadav, Priyanka O, MD      08/12/2019  6:42 PM Patient Name: Charles MoosJohn Walters MRN: [id number] Epilepsy Attending: Charlsie QuestPriyanka O Yadav Referring Physician/Provider: Dr Delia HeadyPramod Sethi Date: 08/12/2019 Duration: 29.04 mins Patient history: 59yo M admitted for respiratory distress, had cardiorespiratory arrest. EEG to evaluate for seizure. Level of alertness: comatose AEDs during EEG study: None Technical aspects: This EEG study was done with scalp electrodes positioned according to the 10-20 International system of electrode placement. Electrical activity was acquired at a sampling rate of 500Hz  and reviewed with a high frequency filter of 70Hz  and a low frequency filter of 1Hz . EEG data were recorded continuously and digitally stored. DESCRIPTION:  EEG showed continuous generalized low amplitude 2-3hz  delta slowing. EEG was not reactive to noxious stimulation. Hyperventilation and photic stimulation were not performed. ABNORMALITY - Continuous slow, generalized IMPRESSION: This study is suggestive of profound diffuse encephalopathy, non specific to etiology. No seizures or epileptiform discharges were seen throughout the recording. Priyanka O Yadav Priyanka O Yadav     PHYSICAL EXAM Temp:  [97.9 F (36.6 C)-99.1 F (37.3 C)] 99.1 F (37.3 C) (03/08 1537) Pulse Rate:  [65-130] 121 (03/08 1500) Resp:  [23-48] 34 (03/08 1500) BP: (76-224)/(18-138) 112/83 (03/08 1500) SpO2:  [79 %-100 %] 100 % (03/08 1500) FiO2 (%):  [50 %-100 %] 70 % (03/08 1617) Weight:  [65.5 kg] 65.5 kg (03/08 0700)  General - Well nourished, well developed, intubated not on sedation.  Ophthalmologic - fundi not visualized due to small pupils.  Cardiovascular - Regular rhythm, but tachycardia 120s.  Neuro - intubated not on sedation, eyes open, not following commands. Eyes able to open bigger on voice but not following any further commands. With eye opening, eyes spontaneous rolling, more right gaze preference, not cross midline, not blinking to visual threat, not  tracking, b/l pupil 2mm, very sluggish to light. Corneal reflex bilateral very weak, gag and cough nearly diminished. Breathing over the vent.  Facial symmetry not able to test due to ET tube.  Tongue protrusion not cooperative. On pain stimulation, no movement of all extremities. DTR 1+ and no babinski. Sensation, coordination and gait not tested.   ASSESSMENT/PLAN Charles Walters is a 59 y.o. male with history of HTN and tobacco use [facility] with intractable hiccups, loss of appetite and diarrhea - complicated hospital course with dyspnea -> abnormal CXR -> resp  failure -> intubation -> bradycardia -> asystole -> CPR -> TPA for possible PE -> hypotension -> AMS -> CT -> large right MCA territory stroke.  He receive IV t-PA for possible pulmonary embolism not for stroke.  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 Cardiac arrest s/p CPR vs. endocarditis  Resultant comatose state with poor neurological exam  CT head - Acute infarction in the right middle cerebral artery territory, with sparing of the basal ganglia. Early brain swelling and low density.   CTA head marked hypoattenuation multiple M2 and distal R MCA branch vessels w/ occlusion multiple Proximal R M2 branches. Calcified plaque intracranial ICAs. Moderate stenosis paraclinoid R ICA.  CTA neck L>R ICA bifurcation plaque. 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 R>L airspace dz lung apices. Mildly displaced fx R 2nd rib.  MRI head - multiple b/l vascular territory infarcts w/ large R MCA infarct   2D Echo - EF 60-65%. No source of embolus   BLE Venous Dopplers - Right LE DVT  EEG diffuse encephalopathy   May consider TCD bubble study if neuro improvement  2D w/ bubble neg for PFO  [name] - negative  LDL - 36  HgbA1c - 6.0  UDS - positive for THCU  VTE prophylaxis -  Heparin IV  No antithrombotic prior to admission, now on heparin IV  Poor neurologic outcome, recommend palliative care for GOC discussion   Asystolic cardiac arrest, PEA arrest  S/p CPR  On IV heparin  Acute Hypoxemic Hypercarbic Respiratory Failure Bilateral pneumonia  Pneumothorax Leukocytosis   Intubated   Leukocytosis - 21.2->48.4->60.5->42.3  ID on board  on unasyn and bactrim   s/p CT placement  Right LE DVT  Suspected pulmonary embolus - not proven  BLE Venous Dopplers - Right LE DVT  Received tPA for possible PE  Transitioned from full dose lovenox to IV heparin s/p cardiac arrest  Continue heparin IV  Septic shock Sepsis Hx Hypertension  Home BP meds: HCTZ ; Norvasc ; Cozaar  Current BP meds: Levophed  BP stable on pressor . Avoid low BP  Dysphagia Severe Protein Calorie Malnutrition . NPO . On tube feeds @ 60  Tobacco abuse  Current smoker  Smoking cessation counseling may be provided  Nicotine patch provided  Other Stroke Risk Factors  ETOH use, advised to drink no more than 1 alcoholic beverage per day.  Other Active Problems  Code status - Full Code  Tachycardia - Lopressor  Anemia - Hb 9.3->7.5->7.0  Hyperkalemia - 6.3->4.7->4.8   Hospital day # 19  This patient is critically ill due to large embolic stroke, septic shock, cardiac arrest s/p CPR, DVT with suspected PE and at significant risk of neurological worsening, death form recurrent stroke, hemorrhagic conversion, cardiac arrest, sepsis, shock, PE, cerebral edema. This patient's care requires constant monitoring of vital signs, hemodynamics, respiratory and cardiac monitoring, review of multiple databases, neurological assessment, discussion with family, other specialists and medical decision making of high complexity. I spent 35 minutes of neurocritical care time in the care of this patient.  Marvel Plan, MD PhD Stroke Neurology 08/13/2019 4:36 PM   To contact  Stroke Continuity provider, please refer to [email or web address]. After hours, contact General Neurology

## 2019-08-14 DIAGNOSIS — I63419 Cerebral infarction due to embolism of unspecified middle cerebral artery: Secondary | ICD-10-CM

## 2019-08-14 DIAGNOSIS — J95811 Postprocedural pneumothorax: Secondary | ICD-10-CM

## 2019-08-14 DIAGNOSIS — R509 Fever, unspecified: Secondary | ICD-10-CM

## 2019-08-14 LAB — BASIC METABOLIC PANEL
Anion gap: 9 (ref 5–15)
BUN: 46 mg/dL — ABNORMAL HIGH (ref 6–20)
CO2: 29 mmol/L (ref 22–32)
Calcium: 8.6 mg/dL — ABNORMAL LOW (ref 8.9–10.3)
Chloride: 105 mmol/L (ref 98–111)
Creatinine, Ser: 1.25 mg/dL — ABNORMAL HIGH (ref 0.61–1.24)
GFR calc Af Amer: >60 mL/min (ref >60)
GFR calc non Af Amer: >60 mL/min (ref >60)
Glucose, Bld: 152 mg/dL — ABNORMAL HIGH (ref 70–99)
Potassium: 5.4 mmol/L — ABNORMAL HIGH (ref 3.5–5.1)
Sodium: 143 mmol/L (ref 135–145)

## 2019-08-14 LAB — ACID FAST SMEAR (AFB, MYCOBACTERIA)
Acid Fast Smear: NEGATIVE
Acid Fast Smear: NEGATIVE

## 2019-08-14 LAB — BPAM RBC
Blood Product Expiration Date: 202103222359
Blood Product Expiration Date: 202103272359
ISSUE DATE / TIME: 202103060914
ISSUE DATE / TIME: 202103081727
Unit Type and Rh: 6200
Unit Type and Rh: 6200

## 2019-08-14 LAB — PNEUMOCYSTIS JIROVECI SMEAR BY DFA: Pneumocystis jiroveci Ag: NEGATIVE

## 2019-08-14 LAB — GLUCOSE, CAPILLARY
Glucose-Capillary: 126 mg/dL — ABNORMAL HIGH (ref 70–99)
Glucose-Capillary: 136 mg/dL — ABNORMAL HIGH (ref 70–99)
Glucose-Capillary: 151 mg/dL — ABNORMAL HIGH (ref 70–99)
Glucose-Capillary: 152 mg/dL — ABNORMAL HIGH (ref 70–99)
Glucose-Capillary: 153 mg/dL — ABNORMAL HIGH (ref 70–99)
Glucose-Capillary: 192 mg/dL — ABNORMAL HIGH (ref 70–99)

## 2019-08-14 LAB — CBC
HCT: 24.8 % — ABNORMAL LOW (ref 39.0–52.0)
Hemoglobin: 7.9 g/dL — ABNORMAL LOW (ref 13.0–17.0)
MCH: 30.9 pg (ref 26.0–34.0)
MCHC: 31.9 g/dL (ref 30.0–36.0)
MCV: 96.9 fL (ref 80.0–100.0)
Platelets: 181 10*3/uL (ref 150–400)
RBC: 2.56 MIL/uL — ABNORMAL LOW (ref 4.22–5.81)
RDW: 16.8 % — ABNORMAL HIGH (ref 11.5–15.5)
WBC: 33.3 10*3/uL — ABNORMAL HIGH (ref 4.0–10.5)
nRBC: 0.3 % — ABNORMAL HIGH (ref 0.0–0.2)

## 2019-08-14 LAB — TYPE AND SCREEN
ABO/RH(D): A POS
Antibody Screen: NEGATIVE
Unit division: 0
Unit division: 0

## 2019-08-14 LAB — CYTOLOGY - NON PAP

## 2019-08-14 LAB — PATHOLOGIST SMEAR REVIEW

## 2019-08-14 LAB — HEPARIN LEVEL (UNFRACTIONATED): Heparin Unfractionated: 0.19 IU/mL — ABNORMAL LOW (ref 0.30–0.70)

## 2019-08-14 MED ORDER — HEPARIN (PORCINE) 25000 UT/250ML-% IV SOLN
1550.0000 [IU]/h | INTRAVENOUS | Status: DC
  Administered 2019-08-14: 1200 [IU]/h via INTRAVENOUS
  Administered 2019-08-15: 1300 [IU]/h via INTRAVENOUS
  Administered 2019-08-16 – 2019-08-17 (×2): 1550 [IU]/h via INTRAVENOUS
  Filled 2019-08-14 (×4): qty 250

## 2019-08-14 MED ORDER — SODIUM POLYSTYRENE SULFONATE 15 GM/60ML PO SUSP
30.0000 g | Freq: Once | ORAL | Status: AC
  Administered 2019-08-14: 09:00:00 30 g
  Filled 2019-08-14: qty 120

## 2019-08-14 MED ORDER — ACETAMINOPHEN 650 MG RE SUPP: 650.0000 mg | Freq: Four times a day (QID) | RECTAL | Status: DC | PRN

## 2019-08-14 MED ORDER — ACETAMINOPHEN 160 MG/5ML PO SOLN
650.0000 mg | Freq: Four times a day (QID) | ORAL | Status: DC | PRN
  Administered 2019-08-14: 650 mg
  Filled 2019-08-14: qty 20.3

## 2019-08-14 NOTE — Progress Notes (Signed)
[facility] Infectious [facility]  Date of Admission:  07/28/2019     Total days of antibiotics 18         ASSESSMENT:  Charles Walters has developed fever this morning. Pneumocystis smear was negative and fungal testing to this point has been unrevealing. Question if fever could be neuro related to his right MCA stroke vs infection. Continues to require full ventilatory support and is unresponsive to stimuli and not currently on sedation. Recommend continuing treatment for interstitial pneumonia with amp/sulbactam. Will stop Bactrim with negative pneumocystis smears. If continues to have fever will consider blood culture.   PLAN:  1. Discontinue Bactrim 2. Continue Amp/sulbactam. 3. Monitor fever curve for continued fevers. 4. Chest tube management per primary team.   Principal Problem:   Acute respiratory failure with hypoxia (HCC) Active Problems:   Hyponatremia   Intractable hiccups   Diarrhea   Asthenia   Essential hypertension   Community acquired bacterial pneumonia   Acute interstitial pneumonia (HCC)   Leukocytosis   Sinus tachycardia   Tobacco abuse   Severe protein-calorie malnutrition (HCC)   Acute deep vein thrombosis (DVT) of right lower extremity (HCC)   Anemia   Cerebral embolism with cerebral infarction   Spontaneous tension pneumothorax   . sodium chloride   Intravenous Once  . sodium chloride   Intravenous Once  . chlorhexidine gluconate (MEDLINE KIT)  15 mL Mouth Rinse BID  . Chlorhexidine Gluconate Cloth  6 each Topical Daily  . feeding supplement (PRO-STAT SUGAR FREE 64)  30 mL Per Tube Daily  . free water  200 mL Per Tube Q4H  . insulin aspart  0-6 Units Subcutaneous Q4H  . mouth rinse  15 mL Mouth Rinse 10 times per day  . methylPREDNISolone (SOLU-MEDROL) injection  60 mg Intravenous Q24H  . multivitamin  15 mL Per Tube Daily  . nicotine  7 mg Transdermal Daily  . pantoprazole (PROTONIX) IV  40 mg Intravenous Q24H  . sodium chloride flush   3 mL Intravenous Once  . thiamine injection  100 mg Intravenous Daily    SUBJECTIVE:  Febrile this morning with a temperature of 101. WBC count is down to 33.3 Pneumocystis smear negative.   No Known Allergies   Review of Systems: Review of Systems  Unable to perform ROS: Patient unresponsive      OBJECTIVE: Vitals:   08/14/19 1100 08/14/19 1104 08/14/19 1200 08/14/19 1300  BP: 118/84  129/88 122/81  Pulse: (!) 108  (!) 113 (!) 109  Resp: (!) 36  (!) 34 (!) 38  Temp:  98.3 F (36.8 C)    TempSrc:  Axillary    SpO2: 96%  97% 97%  Weight:      Height:       Body mass index is 19.17 kg/m.  Physical Exam Constitutional:      General: He is not in acute distress.    Appearance: He is well-developed. He is ill-appearing.     Comments: Lying in bed with head of bed elevated  Cardiovascular:     Rate and Rhythm: Regular rhythm. Tachycardia present.  Pulmonary:     Effort: Pulmonary effort is normal. Tachypnea present.     Breath sounds: Normal breath sounds.  Skin:    General: Skin is warm and dry.  Neurological:     Mental Status: He is alert and oriented to person, place, and time.  Psychiatric:        Behavior: Behavior normal.  Thought Content: Thought content normal.        Judgment: Judgment normal.     Lab Results Lab Results  Component Value Date   WBC 33.3 (H) 08/14/2019   HGB 7.9 (L) 08/14/2019   HCT 24.8 (L) 08/14/2019   MCV 96.9 08/14/2019   PLT 181 08/14/2019    Lab Results  Component Value Date   CREATININE 1.25 (H) 08/14/2019   BUN 46 (H) 08/14/2019   NA 143 08/14/2019   K 5.4 (H) 08/14/2019   CL 105 08/14/2019   CO2 29 08/14/2019    Lab Results  Component Value Date   ALT 33 08/13/2019   AST 25 08/13/2019   ALKPHOS 71 08/13/2019   BILITOT 0.6 08/13/2019     Microbiology: Recent Results (from the past 240 hour(s))  MRSA PCR Screening     Status: None   Collection Time: 08/15/2019  5:01 AM   Specimen: Nasal Mucosa;  Nasopharyngeal  Result Value Ref Range Status   MRSA by PCR NEGATIVE NEGATIVE Final    Comment:        The GeneXpert MRSA Assay (FDA approved for NASAL specimens only), is one component of a comprehensive MRSA colonization surveillance program. It is not intended to diagnose MRSA infection nor to guide or monitor treatment for MRSA infections. Performed at [facility], [place] [address], [place], [place] [address]   Culture, blood (Routine X 2) w Reflex to ID Panel     Status: None   Collection Time: 08/08/19  2:08 PM   Specimen: BLOOD  Result Value Ref Range Status   Specimen Description BLOOD RIGHT ANTECUBITAL  Final   Special Requests   Final    BOTTLES DRAWN AEROBIC ONLY Blood Culture results may not be optimal due to an inadequate volume of blood received in culture bottles   Culture   Final    NO GROWTH 5 DAYS Performed at [facility], [place] [address] [place], [place] [address]    Report Status 08/13/2019 FINAL  Final  Culture, blood (Routine X 2) w Reflex to ID Panel     Status: None   Collection Time: 08/08/19  2:09 PM   Specimen: BLOOD LEFT WRIST  Result Value Ref Range Status   Specimen Description BLOOD LEFT WRIST  Final   Special Requests   Final    BOTTLES DRAWN AEROBIC ONLY Blood Culture results may not be optimal due to an inadequate volume of blood received in culture bottles   Culture   Final    NO GROWTH 5 DAYS Performed at [facility], [place] [address] [place], [place] [address]    Report Status 08/13/2019 FINAL  Final  Culture, respiratory     Status: None (Preliminary result)   Collection Time: 08/11/19 12:29 PM   Specimen: Bronchoalveolar Lavage; Respiratory  Result Value Ref Range Status   Specimen Description BRONCHIAL ALVEOLAR LAVAGE  Final   Special Requests NONE  Final   Gram Stain NO WBC SEEN NO ORGANISMS SEEN   Final   Culture   Final    RARE ENTEROCOCCUS FAECALIS SUSCEPTIBILITIES TO FOLLOW WITH IN NORMAL RESPIRATORY  FLORA Performed at [facility], [place] [address] [place], [place] [address]    Report Status PENDING  Incomplete  Pneumocystis smear by DFA     Status: None   Collection Time: 08/11/19 12:29 PM   Specimen: Bronchial Alveolar Lavage; Respiratory  Result Value Ref Range Status   Specimen [facility] BRONCHIAL  ALVEOLAR LAVAGE  Final    Comment: LUL   Pneumocystis jiroveci Ag NEGATIVE  Final    Comment: Performed at [facility] Performed at [facility], [address], [place], [place] [address]   Culture, bal-quantitative     Status: Abnormal (Preliminary result)   Collection Time: 08/11/19 12:29 PM   Specimen: Bronchoalveolar Lavage; Respiratory  Result Value Ref Range Status   Specimen Description BRONCHIAL ALVEOLAR LAVAGE  Final   Special Requests NONE  Final   Gram Stain NO WBC SEEN NO ORGANISMS SEEN   Final   Culture (A)  Final    2,000 COLONIES/mL ENTEROCOCCUS FAECALIS CULTURE REINCUBATED FOR BETTER GROWTH Performed at [facility], [place] [address] [place], [place] [address]    Report Status PENDING  Incomplete   Organism ID, Bacteria ENTEROCOCCUS FAECALIS (A)  Final      Susceptibility   Enterococcus faecalis - MIC*    AMPICILLIN <=2 SENSITIVE Sensitive     VANCOMYCIN 1 SENSITIVE Sensitive     * 2,000 COLONIES/mL ENTEROCOCCUS FAECALIS     Terri Piedra, NP [facility] [phone] Pager  08/14/2019  1:26 PM

## 2019-08-14 NOTE — Progress Notes (Signed)
ANTICOAGULATION CONSULT NOTE  Pharmacy Consult for Lovenox>>Heparin Indication: DVT , R/O PE No Known Allergies  Patient Measurements: Height: 6\' 1"  (185.4 cm) Weight: 145 lb 4.5 oz (65.9 kg) IBW/kg (Calculated) : 79.9 Heparin Dosing Weight: 64 kg  Vital Signs: Temp: 99.5 F (37.5 C) (03/09 1551) Temp Source: Axillary (03/09 1551) BP: 138/91 (03/09 1800) Pulse Rate: 111 (03/09 1800)  Labs: Recent Labs    08/11/19 2036 08/12/19 0318 08/12/19 0518 08/12/19 2013 08/13/19 0045 08/13/19 0439 08/13/19 0439 08/13/19 0825 08/13/19 1315 08/13/19 1315 08/13/19 1500 08/14/19 0303 08/14/19 1800  HGB 9.0*   < >  --   --    < > 7.0*   < >  --  7.1*   < > 6.6* 7.9*  --   HCT 28.0*   < >  --   --    < > 22.4*   < >  --  21.0*  --  21.2* 24.8*  --   PLT 195   < >  --   --   --  187  --   --   --   --  191 181  --   APTT 30  --   --   --   --   --   --   --   --   --   --   --   --   LABPROT 17.7*  --   --   --   --   --   --   --   --   --   --   --   --   INR 1.5*  --   --   --   --   --   --   --   --   --   --   --   --   HEPARINUNFRC  --    < >  --  0.29*  --  <0.10*  --   --   --   --   --   --  0.19*  CREATININE  --    < >   < >  --   --   --   --  1.43*  --   --  1.39* 1.25*  --    < > = values in this interval not displayed.    Estimated Creatinine Clearance: 60.8 mL/min (A) (by C-G formula based on SCr of 1.25 mg/dL (H)).   Medical History: Past Medical History:  Diagnosis Date  . Hypertension    Assessment: 59 year old male with newly diagnosed DVT 3/5 placed on full dose Lovenox 3/5 now s/p cardiac arrest with tPA administered 3/6. Pharmacy to dose heparin. CT head with acute infarct, per neuro ok to keep IV heparin for now, will utilize no bolus and low goal.  Heparin was infusing at 1200 units/hour but has been off and on due to bleeding and chest tube placements. Last heparin level was undetectable around 4 AM on 3/8. H/H is low but stable at 7.9/24.8, S/P  transfusion of pRBC 3/8.   Heparin level ~6.5 hrs after restarting heparin infusion at 1200 units/hr earlier today is 0.19 units/ml, which is below the goal range for this pt. Per RN, no issues with IV or bleeding observed.  Goal of Therapy:  Heparin level 0.3-0.5 units/ml Monitor platelets by anticoagulation protocol: Yes   Plan:  -No boluses -Increase heparin to 1350 units/hr -Check 6-hr heparin level -Monitor daily heparin level, CBC  -  Monitor for signs/symptoms of bleeding   Gillermina Hu, PharmD, BCPS, [facility] Clinical Pharmacist 08/14/19, 18:52 PM

## 2019-08-14 NOTE — Progress Notes (Addendum)
PULMONARY / CRITICAL CARE MEDICINE   NAME:  Charles Walters, MRN:  [id number], DOB:  04-28-1961, LOS: 62 ADMISSION DATE:  08/04/2019, CONSULTATION DATE:  07/30/2019 REFERRING MD:  Grandville Silos, CHIEF COMPLAINT:  Hypoxemic respiratory failure  BRIEF HISTORY:    The patient is a 59 year old gentleman who presented 07/13/2019 with hiccups, loss of appetite, hyponatremia, watery diarrhea.  He was also short of breath on exertion.  He had a grossly abnormal chest x-ray and was found to be incidentally hypoxemic.  He was seen initially by pulmonary critical care team and treated empirically for community-acquired pneumonia.  However over the last week he has continued to decline, had worsening hypoxemia, and had a repeat CT chest which shows persistent bilateral lower lobe predominant groundglass opacities with interlobular septal thickening.  Is being called back to evaluate these opacities in the setting of hypoxemic respiratory failure.  SIGNIFICANT PAST MEDICAL HISTORY   Hypertension  SIGNIFICANT EVENTS:  2/22: PCCM consulted and recommended for CAP coverage 3/1: PCCM consulted for persistent infiltrates and hypoxemia, planned for bronch AM of 3/2 for which transferred to ICU; bronch cancelled for worsening O2 requirements  3/6 intubated for bronchoscopy 3/6 cardiac arrest overnight  3/7 chest tube placed for R tension pneumothorax  STUDIES:   2/17: CT Chest wo Contrast: multifocal pneumonia, viral vs atypical; mild aneurysmal dilation of ascending aorta up to 4.2cm  2/18: CT Angio Chest: No PE; nonspecific multifocal pneumonia of atypical etiology 3/1: CT Chest wo Contrast: extensive worsening crazy paving pattern (ground glass opacity + interlobular septal thickening) throughout peripheral lungs bilaterally involving all lung lobes; new patchy regions of consolidative airspace disease in lungs bilaterally; most prominent in lower lobes. Given interval worsening, considerations include acute interstitial  pneumonia/ARDS, atypical infection or pulmonary alveolar proteinosis; small dependent bilateral pleural effusions; dilated main pulmonary artery suggesting pulmonary arterial hypertension; stable ascending thoracic aorta aneurysm.  CXR 3/8 improved aeration throughout the right lung, incompletely reexpanded.  Pleural catheter in appropriate position.  Persistent airspace disease bilaterally.  ETT about 2 cm above the carina.   CULTURES:  Cultures no growth to date Respiratory virus panel negative MRSA PCR negative Covid negative 3/3: Blood cultures x2> neg to date 3/3: Urine legionella>  3/3: Urine strep>  3/6: BAL>> E faecalis. 3/6 pneumocystis smear>> 3/6 BAL AFB >> 3/6 BAL fungus>  ANTIBIOTICS:  Vancomycin 2/22> 3/2 Cefepime 2/25> 3/2 Bactrim 3/2 Azithromycin 3/2 Unasyn 3/7>> anidulafingin 3/7-3/8 Ceftriaxone 3/7-3/8 Bactrim 3/7  CONSULTANTS:  PCCM Neurology ID  SUBJECTIVE:   Off pressors Increasing temperature trend  Decreased FiO2  CONSTITUTIONAL: BP 116/82   Pulse (!) 119   Temp 100.2 F (37.9 C) (Axillary)   Resp (!) 39   Ht _0  (1.854 m)   Wt 65.9 kg   SpO2 100%   BMI 19.17 kg/m   I/O last 3 completed shifts: In: 6480.8 [I.V.:202.1; Blood:428; TK/ZS:0109; IV Piggyback:3326.6] Out: 2270 [Urine:1650; Chest Tube:620]  Vent Mode: PRVC FiO2 (%):  [50 %-70 %] 50 % Set Rate:  [22 bmp] 22 bmp Vt Set:  [48 mL-480 mL] 48 mL PEEP:  [8 cmH20] 8 cmH20  CBC Latest Ref Rng & Units 08/14/2019 08/13/2019 08/13/2019  WBC 4.0 - 10.5 K/uL 33.3(H) 33.6(H) -  Hemoglobin 13.0 - 17.0 g/dL 7.9(L) 6.6(LL) 7.1(L)  Hematocrit 39.0 - 52.0 % 24.8(L) 21.2(L) 21.0(L)  Platelets 150 - 400 K/uL 181 191 -   CMP Latest Ref Rng & Units 08/14/2019 08/13/2019 08/13/2019  Glucose 70 - 99 mg/dL 152(H) 183(H) -  BUN  6 - 20 mg/dL 46(H) 46(H) -  Creatinine 0.61 - 1.24 mg/dL 1.25(H) 1.39(H) -  Sodium 135 - 145 mmol/L 143 141 143  Potassium 3.5 - 5.1 mmol/L 5.4(H) 5.0 4.9  Chloride 98 - 111  mmol/L 105 107 -  CO2 22 - 32 mmol/L 29 28 -  Calcium 8.9 - 10.3 mg/dL 8.6(L) 8.2(L) -  Total Protein 6.5 - 8.1 g/dL - 5.6(L) -  Total Bilirubin 0.3 - 1.2 mg/dL - 0.6 -  Alkaline Phos 38 - 126 U/L - 71 -  AST 15 - 41 U/L - 25 -  ALT 0 - 44 U/L - 33 -   PHYSICAL EXAM: General: Frail, chronically and critically ill appearing M, appears older than stated age. Intubated and poorly responsive  Neuro: Eyes are open. Not responsive to painful stimuli.  HEENT: NCAT, temporal muscle wasting. Pink tacky mm. Anicteric sclera  Cardiovascular: Tachycardic rate regular rhythm. s1s2 no rgm  Lungs: R sided chest rube with serosang output, airleak. No rhonchi, inspiratory squeak on R side of chest Abdomen: soft flat ndnt  Musculoskeletal: RLE 1+ pitting edema Skin: c/d/w without rash  RESOLVED PROBLEM LIST   ASSESSMENT AND PLAN    Asystolic cardiac arrest, PEA cardiac arrest. -14 min code.  Due to concern for PE from DVT, was given TPA postcode.  P -MRI brain when stable from respiratory standpoint.  Acute next hypoxemic hypercarbic respiratory failure  Acute interstitial pneumonia versus alveolar hemorrhage. Differential diagnosis follows that of "crazy paving" Areas of groundglass with interlobular septal thickening. Right-sided pneumothorax s/p chest tube placement, continuous air leak. P -Continue full vent support-LTVV, 4 to 8 cc/kg ideal body weight with goal plateau less than 30 and driving pressure less than 15. -Daily SAT. SBT when appropriate for mental status standpoint -VAP prevention protocol, AM CXR  -ID has been consulted: continue unasyn continue tmp/smx -Pneumocystis smear pending, GM-CSF level sent out to evaluate for PAP.  (If neurologic recovery, would require whole lung lavage for PAP) -Continue steroids -Continue chest tube to -20 cm water.  Continue monitoring airleak.  Flush chest tube per protocol.  Shock, improved   Likely septic versus less likely cardiogenic (LVEF  55 to 60%, mildly enlarged RV & RA-unclear if this is from a PE or acute hypoxic respiratory failure induced hypoxic vasoconstriction). Not currently on sedation. -Weaned off NE overnight   Abnormal echocardiogram-possible mobile density in LVOT -Consult cardiology for possible TEE for further evaluation, they will defer until [place] have been further clarified with family  Acute metabolic encephalopathy likely 2/2 anoxic brain injury, large CVA -Off continuous sedation  R acute MCA stroke- presumed embolic.  Postcode EEG with profound diffuse encephalopathy, but no seizures or epileptiform discharges.  Early edema on CT head. P -Appreciate neurology's assistance; rec GOC/Palliative consult when family arrives from out of state -Brain MRI when stable from a pulmonary standpoint -Heparin paused with PRBC required 3/8  Hyperkalemia, AKI. Treated with insulin D50 calcium and Kayexalate P -Repeat kayexalate 3/9  Leukocytosis w/bandemia: --Id consulted, cont tmp/smx, amp/sulb -Continue to follow cultures --does continue on steroids   Acute right lower extremity DVT -heparin gtt paused with PRBC requirement 3/8  Acute normocytic anemia; likely due to critical illness, blood loss from chest tube -Continue to monitor chest tube output -Hgb goal > 7  Severe protein calorie malnutrition -Continue tube feeds   Best Practice / Goals of Care / Disposition.   Diet: EN Pain/Anxiety/Delirium protocol (if indicated): n/a VAP protocol (if indicated):n/a DVT prophylaxis:  lovenox GI prophylaxis: Protonix Glucose control: monitor CBG Mobility: bed rest Code Status: Full Family Communication: pending 3/9 Disposition: ICU  LABS  Glucose Recent Labs  Lab 08/13/19 0813 08/13/19 1140 08/13/19 1535 08/13/19 2111 08/13/19 2338 08/14/19 0317  GLUCAP 142* 125* 167* 159* 156* 136*    BMET Recent Labs  Lab 08/12/19 0518 08/12/19 0701 08/13/19 0045 08/13/19 0825 08/13/19 1315  08/13/19 1500 08/14/19 0303  NA 142   < >   < >  --  143 141 143  K 6.5*   < >   < >  --  4.9 5.0 5.4*  CL 108  --   --   --   --  107 105  CO2 27  --   --   --   --  28 29  BUN 42*  --   --   --   --  46* 46*  CREATININE 1.26*   < >  --  1.43*  --  1.39* 1.25*  GLUCOSE 118*  --   --   --   --  183* 152*   < > = values in this interval not displayed.    Liver Enzymes Recent Labs  Lab 08/11/19 0345 08/12/19 0318 08/13/19 1500  AST 37 60* 25  ALT 34 53* 33  ALKPHOS 78 95 71  BILITOT 0.5 0.7 0.6  ALBUMIN 1.7* 1.8* 1.6*    Electrolytes Recent Labs  Lab 08/10/19 0556 08/10/19 1700 08/11/19 0345 08/11/19 0345 08/11/19 2036 08/12/19 0318 08/12/19 0318 08/12/19 0518 08/13/19 1500 08/14/19 0303  CALCIUM   < >  --  8.8*   < >  --  8.6*   < > 8.7* 8.2* 8.6*  MG   < > 1.9 1.9  --  2.0 2.1  --   --   --   --   PHOS  --  2.6 3.3  --  7.1*  --   --   --   --   --    < > = values in this interval not displayed.    CBC Recent Labs  Lab 08/13/19 0439 08/13/19 0439 08/13/19 1315 08/13/19 1500 08/14/19 0303  WBC 42.3*  --   --  33.6* 33.3*  HGB 7.0*   < > 7.1* 6.6* 7.9*  HCT 22.4*   < > 21.0* 21.2* 24.8*  PLT 187  --   --  191 181   < > = values in this interval not displayed.    ABG Recent Labs  Lab 08/11/19 1313 08/13/19 0045 08/13/19 1315  PHART 7.185* 7.278* 7.314*  PCO2ART 75.9* 58.0* 58.8*  PO2ART 229.0* 51.0* 104.0    Coag's Recent Labs  Lab 08/11/19 2036  APTT 30  INR 1.5*    Sepsis Markers Recent Labs  Lab 08/08/19 0248  PROCALCITON 3.64    Cardiac Enzymes No results for input(s): TROPONINI, PROBNP in the last 168 hours.  CRITICAL CARE Performed by: Cristal Generous   Total critical care time: 35 minutes  Critical care time was exclusive of separately billable procedures and treating other patients. Critical care was necessary to treat or prevent imminent or life-threatening deterioration.  Critical care was time spent personally by  me on the following activities: development of treatment plan with patient and/or surrogate as well as nursing, discussions with consultants, evaluation of patient's response to treatment, examination of patient, obtaining history from patient or surrogate, ordering and performing treatments and interventions, ordering and review of laboratory studies, ordering  and review of radiographic studies, pulse oximetry and re-evaluation of patient's condition.  Eliseo Gum MSN, AGACNP-BC [place] [phone] If no answer, [phone] 08/14/2019, 7:51 AM    PCCM attending:  59 year old gentleman admitted for respiratory failure bilateral infiltrates.  Upper lobe groundglass opacities.  Decision made for intubation and bronchoscopy.  Following intubation 5 to 6 hours later suffered an acute asystolic arrest in the ICU.  Known lower extremity DVT.  Patient was given TPA.  Found to have multifocal infarcts in the brain embolic event with a large MCA stroke.  Neurology has seen the patient.  Infectious disease also following.  All cultures at this point negative.  Patient unfortunately has suffered a significant neurologic event from multifocal embolic strokes.  BP (!) 138/91   Pulse (!) 111   Temp 99.5 F (37.5 C) (Axillary)   Resp (!) 36   Ht _0  (1.854 m)   Wt 65.9 kg   SpO2 100%   BMI 19.17 kg/m   General: Critically ill intubated on mechanical life support Neck: Endotracheal tube in place Heart: Regular rhythm S1-S2 Lungs: Bilateral mechanically ventilated breath sounds Extremities: No significant edema  Labs: Reviewed Chest imaging: Reviewed Neuro imaging: Reviewed  Assessment: Acute hypoxemic respiratory failure requiring intubation mechanical ventilation, acute interstitial pneumonia Asystolic cardiac arrest PEA arrest Large MCA stroke, multiple anterior and posterior infarcts possible paradoxical emboli?  Versus endocarditis Ruptured bleb with  right-sided pneumothorax status post chest tube placement Shock Possible lesion within the LVOT, TEE pending On heparin With hemoglobin drop, continue to observe for any active bleeding Severe protein calorie malnutrition  Plan: Restarting heparin Watching H&H On Bactrim plus Unasyn Appreciate ID input Appreciate neurology assistance Brain MRI needed Continue tube feeds We will need to continue goals of care discussion with patient's family.  This patient is critically ill with multiple organ system failure; which, requires frequent high complexity decision making, assessment, support, evaluation, and titration of therapies. This was completed through the application of advanced monitoring technologies and extensive interpretation of multiple databases. During this encounter critical care time was devoted to patient care services described in this note for 33 minutes.  [place] [facility] 08/14/2019 6:17 PM

## 2019-08-14 NOTE — Progress Notes (Signed)
STROKE TEAM PROGRESS NOTE   INTERVAL HISTORY Pt still intubated on vent, eyes spontaneous open, but not responsive. No movement of extremities. Breathing using external muscles. Had worsening anemia yesterday and received PRBC transfusion. Currently Hb stable and heparin IV resumed.    OBJECTIVE Vitals:   08/14/19 0900 08/14/19 1000 08/14/19 1100 08/14/19 1104  BP: 125/83 126/88 118/84   Pulse: 98 (!) 111 (!) 108   Resp: (!) 40 (!) 37 (!) 36   Temp:    98.3 F (36.8 C)  TempSrc:    Axillary  SpO2: 95% 99% 96%   Weight:      Height:        CBC:  Recent Labs  Lab 08/11/19 0345 08/11/19 1313 08/12/19 0318 08/13/19 0045 08/13/19 1500 08/14/19 0303  WBC 21.2*   < > 60.5*   < > 33.6* 33.3*  NEUTROABS 17.5*  --  51.3*  --   --   --   HGB 6.9*   < > 9.3*   < > 6.6* 7.9*  HCT 20.5*   < > 29.2*   < > 21.2* 24.8*  MCV 92.8   < > 99.0   < > 100.0 96.9  PLT 187   < > 201   < > 191 181   < > = values in this interval not displayed.    Basic Metabolic Panel:  Recent Labs  Lab 08/11/19 0345 08/11/19 0345 08/11/19 1313 08/11/19 2036 08/12/19 0318 08/12/19 0518 08/13/19 1500 08/14/19 0303  NA 139   < >   < >  --  142   < > 141 143  K 4.6   < >   < >  --  6.7*   < > 5.0 5.4*  CL 107   < >  --   --  108   < > 107 105  CO2 24   < >  --   --  22   < > 28 29  GLUCOSE 112*   < >  --   --  117*   < > 183* 152*  BUN 33*   < >  --   --  39*   < > 46* 46*  CREATININE 0.76   < >  --   --  1.17   < > 1.39* 1.25*  CALCIUM 8.8*   < >  --   --  8.6*   < > 8.2* 8.6*  MG 1.9  --   --  2.0 2.1  --   --   --   PHOS 3.3  --   --  7.1*  --   --   --   --    < > = values in this interval not displayed.   Lipid Panel:     Component Value Date/Time   CHOL 90 08/13/2019 0439   TRIG 112 08/13/2019 0439   HDL 32 (L) 08/13/2019 0439   CHOLHDL 2.8 08/13/2019 0439   VLDL 22 08/13/2019 0439   LDLCALC 36 08/13/2019 0439   HgbA1c:  Lab Results  Component Value Date   HGBA1C 6.0 (H) 08/13/2019    Urine Drug Screen:     Component Value Date/Time   LABOPIA NONE DETECTED 08/11/2019 1328   COCAINSCRNUR NONE DETECTED 08/11/2019 1328   LABBENZ NONE DETECTED 08/11/2019 1328   AMPHETMU NONE DETECTED 08/11/2019 1328   THCU POSITIVE (A) 08/11/2019 1328   LABBARB NONE DETECTED 08/11/2019 1328    Alcohol Level  Component Value Date/Time   ETH <10 08/02/2019 1633    IMAGING past 24h DG CHEST PORT 1 VIEW  Result Date: 08/13/2019 CLINICAL DATA:  Hypoxia. EXAM: PORTABLE CHEST 1 VIEW COMPARISON:  Radiograph of same day. FINDINGS: Stable cardiomegaly. Endotracheal and nasogastric tubes are in grossly good position. Left-sided PICC line is unchanged. Stable position of right-sided chest tube is noted. Stable small right lateral pneumothorax is noted stable right lung opacities are noted concerning for pneumonia. Bony thorax is unremarkable. IMPRESSION: Stable support apparatus. Stable small right lateral pneumothorax. Stable right lung opacities concerning for pneumonia. Electronically Signed   By: Lupita Raider M.D.   On: 08/13/2019 13:54     PHYSICAL EXAM  Temp:  [98.3 F (36.8 C)-101.1 F (38.4 C)] 98.3 F (36.8 C) (03/09 1104) Pulse Rate:  [98-123] 108 (03/09 1100) Resp:  [25-40] 36 (03/09 1100) BP: (101-135)/(73-96) 118/84 (03/09 1100) SpO2:  [89 %-100 %] 96 % (03/09 1100) FiO2 (%):  [50 %-70 %] 50 % (03/09 1112) Weight:  [65.9 kg] 65.9 kg (03/09 0500)  General - Well nourished, well developed, intubated not on sedation.  Ophthalmologic - fundi not visualized due to small pupils.  Cardiovascular - Regular rhythm, but tachycardia 110s.  Respiratory - triggers vent, but SOB using external muscles  Neuro - intubated not on sedation, eyes spontaneous open, not following commands. Eyes able to open bigger on voice but not following any further commands. With eye opening, eyes spontaneous rolling horizontally, not blinking to visual threat, not tracking, b/l pupil 19mm, very  sluggish to light. Corneal reflex bilateral very weak, gag and cough nearly diminished. Breathing over the vent.  Facial symmetry not able to test due to ET tube.  Tongue protrusion not cooperative. On pain stimulation, no movement of all extremities. DTR 1+ and no babinski. Sensation, coordination and gait not tested.   ASSESSMENT/PLAN Charles Walters is a 59 y.o. male with history of HTN and tobacco use admitted to [facility] February 17 with intractable hiccups, loss of appetite and diarrhea - complicated hospital course with dyspnea -> abnormal CXR -> resp failure -> intubation -> bradycardia -> asystole -> CPR -> TPA for possible PE -> hypotension -> AMS -> CT -> large right MCA territory stroke.  He receive IV t-PA for possible pulmonary embolism not for stroke.  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 Cardiac arrest s/p CPR vs. endocarditis  Resultant comatose state with poor neurological exam  CT head - Acute infarction in the right middle cerebral artery territory, with sparing of the basal ganglia. Early brain swelling and low density.   CTA head marked hypoattenuation multiple M2 and distal R MCA branch vessels w/ occlusion multiple Proximal R M2 branches. Calcified plaque intracranial ICAs. Moderate stenosis paraclinoid R ICA.  CTA neck L>R ICA bifurcation plaque. 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 R>L airspace dz lung apices. Mildly displaced fx R 2nd rib.  MRI head - multiple b/l vascular territory infarcts w/ large R MCA infarct   2D Echo - EF 60-65%. No source of embolus   BLE Venous Dopplers - Right LE DVT  EEG diffuse encephalopathy, no seizure   May consider TCD bubble study or TEE if neuro improvement  2D w/ bubble neg for PFO  [name] - negative  LDL - 36  HgbA1c - 6.0  UDS - positive  for [facility] Dt  VTE prophylaxis - Heparin  IV  No antithrombotic prior to admission, now on heparin IV (briefly on hold 3/8 given anemia requiring transfusion)  Poor neurologic outcome, recommend palliative care for GOC discussion   Asystolic cardiac arrest, PEA arrest  S/p CPR  On IV heparin - briefly off due to severe anemia requiring PRBC  Acute Hypoxemic Hypercarbic Respiratory Failure Bilateral pneumonia  R Hydropneumothorax Leukocytosis and fever  Intubated   Leukocytosis w/ bandemia - 21.2->48.4->60.5->42.3->33.6->33.3  Tmax 100.1->101.1  ID on board  on unasyn   Off bactrium  s/p CT placement  Right LE DVT  Suspected pulmonary embolus - not proven  BLE Venous Dopplers - Right LE DVT  Received tPA for possible PE  Transitioned from full dose lovenox to IV heparin s/p cardiac arrest  heparin IV on hold given anemia requiring transfusion 3/8 -> now resumed 3/9  Septic shock vs Cardiogenic Sepsis Hx Hypertension  Home BP meds: HCTZ ; Norvasc ; Cozaar  Current BP meds: Levophed  off pressor now . Avoid low BP  Anemia   Hb 9.3->7.5->7.0->7.1->6.6-> PRBC ->7.9  normocytic s/p CT placement  Close monitoring  Continue on heparin   Dysphagia Severe Protein Calorie Malnutrition . NPO . On tube feeds @ 60  Tobacco abuse  Current smoker  Smoking cessation counseling may be provided  Nicotine patch provided  Other Stroke Risk Factors  ETOH use, advised to drink no more than 1 alcoholic beverage per day.  Other Active Problems  Code status - Full Code  Tachycardia - Lopressor  Hyperkalemia - 6.3->4.7->4.8->4.9->5.0->5.4  AKI Cre 1.43->1.39->1.25  Hospital day # 20  This patient is critically ill due to large embolic stroke, septic shock, cardiac arrest s/p CPR, DVT with suspected PE and at significant risk of neurological worsening, death form recurrent stroke, hemorrhagic conversion, cardiac arrest, sepsis, shock, PE, cerebral edema. This patient's care requires constant  monitoring of vital signs, hemodynamics, respiratory and cardiac monitoring, review of multiple databases, neurological assessment, discussion with family, other specialists and medical decision making of high complexity. I spent 30 minutes of neurocritical care time in the care of this patient.  Marvel Plan, MD PhD Stroke Neurology 08/14/2019 11:29 AM   To contact Stroke Continuity provider, please refer to [email or web address]. After hours, contact General Neurology

## 2019-08-14 NOTE — Progress Notes (Signed)
ANTICOAGULATION CONSULT NOTE  Pharmacy Consult for lovenox>>heparin Indication: DVT , R/o PE No Known Allergies  Patient Measurements: Height: 6\' 1"  (185.4 cm) Weight: 145 lb 4.5 oz (65.9 kg) IBW/kg (Calculated) : 79.9 Heparin Dosing Weight: 64kg  Vital Signs: Temp: 101.1 F (38.4 C) (03/09 0810) Temp Source: Axillary (03/09 0810) BP: 116/82 (03/09 0700) Pulse Rate: 119 (03/09 0700)  Labs: Recent Labs     0000 08/11/19 2036 08/12/19 0318 08/12/19 0518 08/12/19 0701 08/12/19 2013 08/13/19 0045 08/13/19 0439 08/13/19 0439 08/13/19 0825 08/13/19 1315 08/13/19 1315 08/13/19 1500 08/14/19 0303  HGB  --  9.0*   < >  --   --   --    < > 7.0*   < >  --  7.1*   < > 6.6* 7.9*  HCT  --  28.0*   < >  --   --   --    < > 22.4*   < >  --  21.0*  --  21.2* 24.8*  PLT   < > 195   < >  --   --   --   --  187  --   --   --   --  191 181  APTT  --  30  --   --   --   --   --   --   --   --   --   --   --   --   LABPROT  --  17.7*  --   --   --   --   --   --   --   --   --   --   --   --   INR  --  1.5*  --   --   --   --   --   --   --   --   --   --   --   --   HEPARINUNFRC  --   --   --   --  0.26* 0.29*  --  <0.10*  --   --   --   --   --   --   CREATININE  --   --    < >   < >  --   --   --   --   --  1.43*  --   --  1.39* 1.25*   < > = values in this interval not displayed.    Estimated Creatinine Clearance: 60.8 mL/min (A) (by C-G formula based on SCr of 1.25 mg/dL (H)).   Medical History: Past Medical History:  Diagnosis Date  . Hypertension    Assessment: 59 year old male with newly diagnosed DVT 3/5 placed on full dose lovenox 3/5 now s/p cardiac arrest with tPA administered 3/6. Pharmacy to dose heparin. CT head with acute infarct, per neuro ok to keep IV heparin for now, will utilize no bolus and low goal.  Heparin was infusing at 1200 units/hour but has been off and on due to bleeding and chest tube placements. Last heparin level was undetectable around 4 am on  3/8. H&H is low but stable at 7.9/24.8 s/p transfusion of pRBC 3/8.   Goal of Therapy:  Heparin level 0.3-0.5 units/ml Monitor platelets by anticoagulation protocol: Yes   Plan:  -No boluses -Restart heparin at 1200 units/h -Check 6hr heparin level -Daily heparin level and CBC  -Monitor for signs of bleeding    Thank you,  Eddie Candle, PharmD PGY-1 Pharmacy Resident   Please check amion for clinical pharmacist contact number

## 2019-08-15 ENCOUNTER — Inpatient Hospital Stay (HOSPITAL_COMMUNITY): Payer: Medicaid Other

## 2019-08-15 DIAGNOSIS — Z681 Body mass index (BMI) 19 or less, adult: Secondary | ICD-10-CM

## 2019-08-15 DIAGNOSIS — Z978 Presence of other specified devices: Secondary | ICD-10-CM

## 2019-08-15 DIAGNOSIS — E440 Moderate protein-calorie malnutrition: Secondary | ICD-10-CM

## 2019-08-15 LAB — CBC
HCT: 24.5 % — ABNORMAL LOW (ref 39.0–52.0)
Hemoglobin: 7.8 g/dL — ABNORMAL LOW (ref 13.0–17.0)
MCH: 31.1 pg (ref 26.0–34.0)
MCHC: 31.8 g/dL (ref 30.0–36.0)
MCV: 97.6 fL (ref 80.0–100.0)
Platelets: 191 10*3/uL (ref 150–400)
RBC: 2.51 MIL/uL — ABNORMAL LOW (ref 4.22–5.81)
RDW: 16.5 % — ABNORMAL HIGH (ref 11.5–15.5)
WBC: 38.8 10*3/uL — ABNORMAL HIGH (ref 4.0–10.5)
nRBC: 0.4 % — ABNORMAL HIGH (ref 0.0–0.2)

## 2019-08-15 LAB — BASIC METABOLIC PANEL
Anion gap: 9 (ref 5–15)
BUN: 44 mg/dL — ABNORMAL HIGH (ref 6–20)
CO2: 30 mmol/L (ref 22–32)
Calcium: 8.5 mg/dL — ABNORMAL LOW (ref 8.9–10.3)
Chloride: 109 mmol/L (ref 98–111)
Creatinine, Ser: 1.1 mg/dL (ref 0.61–1.24)
GFR calc Af Amer: >60 mL/min (ref >60)
GFR calc non Af Amer: >60 mL/min (ref >60)
Glucose, Bld: 150 mg/dL — ABNORMAL HIGH (ref 70–99)
Potassium: 5.1 mmol/L (ref 3.5–5.1)
Sodium: 148 mmol/L — ABNORMAL HIGH (ref 135–145)

## 2019-08-15 LAB — HEPARIN LEVEL (UNFRACTIONATED)
Heparin Unfractionated: 0.61 IU/mL (ref 0.30–0.70)
Heparin Unfractionated: 0.27 IU/mL — ABNORMAL LOW (ref 0.30–0.70)
Heparin Unfractionated: 0.22 IU/mL — ABNORMAL LOW (ref 0.30–0.70)
Heparin Unfractionated: 0.23 IU/mL — ABNORMAL LOW (ref 0.30–0.70)

## 2019-08-15 LAB — CULTURE, BAL-QUANTITATIVE W GRAM STAIN
Culture: 2000 — AB
Gram Stain: NONE SEEN

## 2019-08-15 LAB — GLUCOSE, CAPILLARY
Glucose-Capillary: 133 mg/dL — ABNORMAL HIGH (ref 70–99)
Glucose-Capillary: 137 mg/dL — ABNORMAL HIGH (ref 70–99)
Glucose-Capillary: 140 mg/dL — ABNORMAL HIGH (ref 70–99)
Glucose-Capillary: 141 mg/dL — ABNORMAL HIGH (ref 70–99)
Glucose-Capillary: 151 mg/dL — ABNORMAL HIGH (ref 70–99)
Glucose-Capillary: 160 mg/dL — ABNORMAL HIGH (ref 70–99)
Glucose-Capillary: 176 mg/dL — ABNORMAL HIGH (ref 70–99)

## 2019-08-15 MED ORDER — METHYLPREDNISOLONE SODIUM SUCC 40 MG IJ SOLR
40.0000 mg | INTRAMUSCULAR | Status: DC
  Administered 2019-08-15 – 2019-08-17 (×3): 40 mg via INTRAVENOUS
  Filled 2019-08-15 (×3): qty 1

## 2019-08-15 NOTE — Progress Notes (Addendum)
ANTICOAGULATION CONSULT NOTE  Pharmacy Consult for heparin Indication: DVT , R/o PE No Known Allergies  Patient Measurements: Height: 6\' 1"  (185.4 cm) Weight: 146 lb 9.7 oz (66.5 kg) IBW/kg (Calculated) : 79.9 Heparin Dosing Weight: 64kg  Vital Signs: Temp: 98.9 F (37.2 C) (03/10 0400) Temp Source: Oral (03/10 0400) BP: 161/106 (03/10 0500) Pulse Rate: 98 (03/10 0500)  Labs: Recent Labs    08/13/19 0439 08/13/19 1500 08/13/19 1500 08/14/19 0303 08/14/19 1800 08/15/19 0059 08/15/19 0433  HGB  --  6.6*   < > 7.9*  --   --  7.8*  HCT  --  21.2*  --  24.8*  --   --  24.5*  PLT  --  191  --  181  --   --  191  HEPARINUNFRC   < >  --   --   --  0.19* 0.61 0.27*  CREATININE  --  1.39*  --  1.25*  --   --  1.10   < > = values in this interval not displayed.    Estimated Creatinine Clearance: 69.7 mL/min (by C-G formula based on SCr of 1.1 mg/dL).   Medical History: Past Medical History:  Diagnosis Date  . Hypertension    Assessment: 59 year old male with newly diagnosed DVT 3/5 placed on full dose lovenox 3/5 now s/p cardiac arrest with tPA administered 3/6. Pharmacy to dose heparin. CT head with acute infarct, per neuro ok to keep IV heparin for now, will utilize no bolus and low goal.  Heparin level is subtherapeutic at 0.22 after a rate decrease to 1300 units/hour. H&H stable at 7.8/24.5, platelets wnl at 191. Per nursing, no infusion issues.   Goal of Therapy:  Heparin level 0.3-0.5 units/ml Monitor platelets by anticoagulation protocol: Yes   Plan:  -No boluses -Slightly increase heparin to 1350 units/h -Check 6hr heparin level  -Daily heparin level and CBC  -Monitor for signs of bleeding    Thank you,   06-21-2023, PharmD PGY-1 Pharmacy Resident   Please check amion for clinical pharmacist contact number

## 2019-08-15 NOTE — Progress Notes (Signed)
[facility] called. Referral # [id number]. Coordinator to call to bedside. Alvino Chapel RN at bedside made aware.

## 2019-08-15 NOTE — Progress Notes (Signed)
ANTICOAGULATION CONSULT NOTE  Pharmacy Consult for heparin Indication: DVT , R/o PE No Known Allergies  Patient Measurements: Height: 6\' 1"  (185.4 cm) Weight: 146 lb 9.7 oz (66.5 kg) IBW/kg (Calculated) : 79.9 Heparin Dosing Weight: 64kg  Vital Signs: BP: 120/84 (03/10 1900) Pulse Rate: 81 (03/10 1946)  Labs: Recent Labs    08/13/19 1500 08/13/19 1500 08/14/19 0303 08/14/19 1800 08/15/19 0433 08/15/19 1203 08/15/19 2130  HGB 6.6*   < > 7.9*  --  7.8*  --   --   HCT 21.2*  --  24.8*  --  24.5*  --   --   PLT 191  --  181  --  191  --   --   HEPARINUNFRC  --   --   --    < > 0.27* 0.22* 0.23*  CREATININE 1.39*  --  1.25*  --  1.10  --   --    < > = values in this interval not displayed.    Estimated Creatinine Clearance: 69.7 mL/min (by C-G formula based on SCr of 1.1 mg/dL).   Medical History: Past Medical History:  Diagnosis Date  . Hypertension    Assessment: 59 year old male with newly diagnosed DVT 3/5 placed on full dose lovenox 3/5 now s/p cardiac arrest with tPA administered 3/6. Pharmacy to dose heparin. CT head with acute infarct, per neuro ok to keep IV heparin for now, will utilize no bolus and low goal.  Heparin level is subtherapeutic at 0.23 despite increased heparin drip rate 1350 uts/hr.  H/h low stable watch pltc ok    Goal of Therapy:  Heparin level 0.3-0.5 units/ml Monitor platelets by anticoagulation protocol: Yes   Plan:  -No boluses -Slightly increase heparin to 1500 units/h -Daily heparin level and CBC  -Monitor for signs of bleeding   59 Pharm.D. CPP, BCPS Clinical Pharmacist [phone] 08/15/2019 10:10 PM      Please check amion for clinical pharmacist contact number

## 2019-08-15 NOTE — Plan of Care (Signed)
  Problem: Clinical Measurements: Goal: Ability to maintain clinical measurements within normal limits will improve Outcome: Progressing Goal: Diagnostic test results will improve Outcome: Progressing Goal: Respiratory complications will improve Outcome: Progressing Goal: Cardiovascular complication will be avoided Outcome: Progressing   

## 2019-08-15 NOTE — Progress Notes (Addendum)
PULMONARY / CRITICAL CARE MEDICINE   NAME:  Charles Walters, MRN:  [id number], DOB:  December 15, 1960, LOS: 21 ADMISSION DATE:  08/04/2019, CONSULTATION DATE:  07/30/2019 REFERRING MD:  Grandville Silos, CHIEF COMPLAINT:  Hypoxemic respiratory failure  BRIEF HISTORY:    The patient is a 59 year old gentleman who presented 07/18/2019 with hiccups, loss of appetite, hyponatremia, watery diarrhea.  He was also short of breath on exertion.  He had a grossly abnormal chest x-ray and was found to be incidentally hypoxemic.  He was seen initially by pulmonary critical care team and treated empirically for community-acquired pneumonia.  However over the last week he has continued to decline, had worsening hypoxemia, and had a repeat CT chest which shows persistent bilateral lower lobe predominant groundglass opacities with interlobular septal thickening.  Is being called back to evaluate these opacities in the setting of hypoxemic respiratory failure.  SIGNIFICANT PAST MEDICAL HISTORY   Hypertension  SIGNIFICANT EVENTS:  2/22: PCCM consulted and recommended for CAP coverage 3/1: PCCM consulted for persistent infiltrates and hypoxemia, planned for bronch AM of 3/2 for which transferred to ICU; bronch cancelled for worsening O2 requirements  3/6 intubated for bronchoscopy 3/6 cardiac arrest overnight  3/7 chest tube placed for R tension pneumothorax 3/9 weaning   STUDIES:   2/17: CT Chest wo Contrast: multifocal pneumonia, viral vs atypical; mild aneurysmal dilation of ascending aorta up to 4.2cm  2/18: CT Angio Chest: No PE; nonspecific multifocal pneumonia of atypical etiology 3/1: CT Chest wo Contrast: extensive worsening crazy paving pattern (ground glass opacity + interlobular septal thickening) throughout peripheral lungs bilaterally involving all lung lobes; new patchy regions of consolidative airspace disease in lungs bilaterally; most prominent in lower lobes. Given interval worsening, considerations include acute  interstitial pneumonia/ARDS, atypical infection or pulmonary alveolar proteinosis; small dependent bilateral pleural effusions; dilated main pulmonary artery suggesting pulmonary arterial hypertension; stable ascending thoracic aorta aneurysm.  CXR 3/8 improved aeration throughout the right lung, incompletely reexpanded.  Pleural catheter in appropriate position.  Persistent airspace disease bilaterally.  ETT about 2 cm above the carina.  CULTURES:  Cultures no growth to date Respiratory virus panel negative MRSA PCR negative Covid negative 3/3: Blood cultures x2> neg to date 3/3: Urine legionella>  3/3: Urine strep>  3/6: BAL>> E faecalis. 3/6 pneumocystis smear> neg 3/6 BAL AFB >> 3/6 BAL fungus>   ANTIBIOTICS:  Vancomycin 2/22> 3/2 Cefepime 2/25> 3/2 Bactrim 3/2 Azithromycin 3/2 Unasyn 3/7>> anidulafingin 3/7-3/8 Ceftriaxone 3/7-3/8 Bactrim 3/7 - 3/9  CONSULTANTS:  PCCM Neurology ID  SUBJECTIVE:   Remains off pressors  Remains intermittently febrile and with leukocytosis.   CONSTITUTIONAL: BP (!) 125/91   Pulse 99   Temp 98.6 F (37 C) (Oral)   Resp (!) 30   Ht _0  (1.854 m)   Wt 66.5 kg   SpO2 100%   BMI 19.34 kg/m   I/O last 3 completed shifts: In: 5453.2 [I.V.:258.4; Blood:428; NG/GT:3127.5; IV Piggyback:1639.4] Out: 3010 [Urine:2450; Chest Tube:560]  Vent Mode: PRVC FiO2 (%):  [50 %] 50 % Set Rate:  [22 bmp] 22 bmp Vt Set:  [480 mL] 480 mL PEEP:  [8 cmH20] 8 cmH20  CBC Latest Ref Rng & Units 08/15/2019 08/14/2019 08/13/2019  WBC 4.0 - 10.5 K/uL 38.8(H) 33.3(H) 33.6(H)  Hemoglobin 13.0 - 17.0 g/dL 7.8(L) 7.9(L) 6.6(LL)  Hematocrit 39.0 - 52.0 % 24.5(L) 24.8(L) 21.2(L)  Platelets 150 - 400 K/uL 191 181 191   CMP Latest Ref Rng & Units 08/15/2019 08/14/2019 08/13/2019  Glucose 70 -  99 mg/dL 150(H) 152(H) 183(H)  BUN 6 - 20 mg/dL 44(H) 46(H) 46(H)  Creatinine 0.61 - 1.24 mg/dL 1.10 1.25(H) 1.39(H)  Sodium 135 - 145 mmol/L 148(H) 143 141  Potassium 3.5 -  5.1 mmol/L 5.1 5.4(H) 5.0  Chloride 98 - 111 mmol/L 109 105 107  CO2 22 - 32 mmol/L _0 Calcium 8.9 - 10.3 mg/dL 8.5(L) 8.6(L) 8.2(L)  Total Protein 6.5 - 8.1 g/dL - - 5.6(L)  Total Bilirubin 0.3 - 1.2 mg/dL - - 0.6  Alkaline Phos 38 - 126 U/L - - 71  AST 15 - 41 U/L - - 25  ALT 0 - 44 U/L - - 33   PHYSICAL EXAM: General:  Chronically and critically ill, frail middle aged M, appears older than stated age, intubated off sedation with poor responsiveness  Neuro: Eyes are open. Blink to threat. + dolls eye.  HEENT: Temporal muscle wasting. Pink mmm. Trachea midline. ETT secure  Cardiovascular: Tachycardic rate. s1s2 no rgm. Cap refill < 3 seconds.  Lungs: R sided chest tube with serosang output. Rapid respiratory rate with scalene muscle use, far overbreathing vent  Abdomen: soft flat ndnt +bowel sounds  Musculoskeletal: RLE +1 pitting edema. Global muscle wasting appreciated. No obvious joint deformity  Skin: c/d/w without rash   RESOLVED PROBLEM LIST   ASSESSMENT AND PLAN    Asystolic cardiac arrest, PEA cardiac arrest. -14 min code.  Due to concern for PE from DVT, was given TPA postcode.  P -MRI brain when stable from respiratory standpoint.  Acute next hypoxemic hypercarbic respiratory failure  Acute interstitial pneumonia versus alveolar hemorrhage. Differential diagnosis follows that of "crazy paving" Areas of groundglass with interlobular septal thickening. Presumed enterococcus faecalis PNA Right-sided pneumothorax s/p chest tube placement Tachypnea -wonder if this is neurogenic in nature?  P -Continue full vent support -- SBTs deferred due to mental status  -VAP prevention protocol, AM CXR  -- continue unasyn  -Continue steroids -Continue chest tube to -20 cm water.  Continue monitoring airleak.  Flush chest tube per protocol.  Shock, improved   Likely septic versus less likely cardiogenic (LVEF 55 to 60%, mildly enlarged RV & RA-unclear if this is from a PE  or acute hypoxic respiratory failure induced hypoxic vasoconstriction). Not currently on sedation. 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  Postcode EEG with profound diffuse encephalopathy, but no seizures or epileptiform discharges.  Early edema on CT head. P -Appreciate neurology's assistance; rec GOC/Palliative consult when family arrives from out of state -Brain MRI when stable from a pulmonary standpoint  AKI, improved P -trend renal indices, UOP  Hyperkalemia, improved Hypernatremia, mild P --Trend BMP, correct PRN   Leukocytosis w/bandemia: Intermittent fever  --ID consulted, continue amp/sulb. Dc tmp/smx with PCP negative  -leukocytosis possibly r/t steroids-- decreasing steroid dose to 27m qD 3/10 -? Neurogenic component here as well   Acute right lower extremity DVT -heparin gtt   Acute normocytic anemia; likely due to critical illness, blood loss from chest tube -Continue to monitor chest tube output -Hgb goal > 7  Severe protein calorie malnutrition -- EN    GOC -- Palliative consult paced. Family planning to come to town from out of state  [employer]/ Goals of  Care / Disposition.   Diet: EN Pain/Anxiety/Delirium protocol (if indicated): n/a VAP protocol (if indicated):n/a DVT prophylaxis: heparin gtt  GI prophylaxis: Protonix Glucose control: monitor CBG Mobility: bed rest Code Status: Full Family Communication: attempted 3/9 without success. Pending 3/10  Disposition: ICU  LABS  Glucose Recent Labs  Lab 08/14/19 1235 08/14/19 1555 08/14/19 1944 08/14/19 2344 08/15/19 0352 08/15/19 0824  GLUCAP 126* 153* 152* 192* 137* 133*     BMET Recent Labs  Lab 08/13/19 1500 08/14/19 0303 08/15/19 0433  NA 141 143 148*  K 5.0 5.4* 5.1  CL 107 105 109  CO2 _0 BUN 46* 46* 44*  CREATININE 1.39* 1.25* 1.10  GLUCOSE 183* 152* 150*    Liver Enzymes Recent Labs  Lab 08/11/19 0345 08/12/19 0318 08/13/19 1500  AST 37 60* 25  ALT 34 53* 33  ALKPHOS 78 95 71  BILITOT 0.5 0.7 0.6  ALBUMIN 1.7* 1.8* 1.6*    Electrolytes Recent Labs  Lab 08/10/19 0556 08/10/19 1700 08/11/19 0345 08/11/19 0345 08/11/19 2036 08/12/19 0318 08/12/19 0518 08/13/19 1500 08/14/19 0303 08/15/19 0433  CALCIUM   < >  --  8.8*   < >  --  8.6*   < > 8.2* 8.6* 8.5*  MG   < > 1.9 1.9  --  2.0 2.1  --   --   --   --   PHOS  --  2.6 3.3  --  7.1*  --   --   --   --   --    < > = values in this interval not displayed.    CBC Recent Labs  Lab 08/13/19 1500 08/14/19 0303 08/15/19 0433  WBC 33.6* 33.3* 38.8*  HGB 6.6* 7.9* 7.8*  HCT 21.2* 24.8* 24.5*  PLT 191 181 191    ABG Recent Labs  Lab 08/11/19 1313 08/13/19 0045 08/13/19 1315  PHART 7.185* 7.278* 7.314*  PCO2ART 75.9* 58.0* 58.8*  PO2ART 229.0* 51.0* 104.0    Coag's Recent Labs  Lab 08/11/19 2036  APTT 30  INR 1.5*    Sepsis Markers No results for input(s): LATICACIDVEN, PROCALCITON, O2SATVEN in the last 168 hours.  Cardiac Enzymes No results for input(s): TROPONINI, PROBNP in the last 168 hours.  CRITICAL CARE Performed by: Cristal Generous   Total critical care time: 40 minutes  Critical care time was exclusive of separately billable procedures and treating other patients. Critical care was necessary to treat or prevent imminent or life-threatening deterioration.  Critical care was time spent personally by me on the following activities: development of treatment plan with patient and/or surrogate as well as nursing, discussions with consultants, evaluation of patient's response to treatment, examination of patient, obtaining history from  patient or surrogate, ordering and performing treatments and interventions, ordering and review of laboratory studies, ordering and review of radiographic studies, pulse oximetry and re-evaluation of patient's condition.  Eliseo Gum MSN, AGACNP-BC [place] [phone] If no answer, [phone] 08/15/2019, 10:50 AM    PCCM attending:  59 year old gentleman initially admitted with respiratory failure, bilateral infiltrates, acute interstitial pneumonia, interlobular septal thickening crazy paving.  Unclear etiology.  Decision was made for intubation and bronchoscopy.  Bronchoscopy grew Candida and Enterococcus faecalis.  Presumed related to aspiration pneumonia's for the Enterococcus.  Hospital course complicated by asystolic arrest, multifocal bilateral stroke, large MCA stroke.  And lower extremity DVT.  BP 127/87   Pulse (!) 102   Temp 98.6 F (37 C) (Oral)  Resp (!) 36   Ht _0  (1.854 m)   Wt 66.5 kg   SpO2 100%   BMI 19.34 kg/m   General: Elderly male intubated cachectic chronically ill-appearing muscle wasting temporalis muscle wasting HEENT: Endotracheal tube in place Heart: Regular rate rhythm S1-S2 Lungs: Bilateral mechanically ventilated breath sounds Chest: Paradoxical chest wall motion concerning for significant rib fracture following arrest Abdomen: No significant distention Extremities: Muscle wasting Neuro: No response to pain  Labs: Reviewed, BAL cultures Enterococcus, 2000 colonies in the thousand colonies of Candida albicans Imaging: Chest x-ray, stable tube, no pneumothorax, diffuse airspace disease The patient's images have been independently reviewed by me.    Echo TTE bubble study negative for shunt  Assessment: Acute hypoxemic respiratory failure requiring intubation mechanical ventilation, acute interstitial pneumonia, possible aspiration, BAL cultures with Enterococcus faecalis, Candida albicans likely normal  flora Asystolic cardiac arrest, PEA cardiac arrest Large MCA stroke, multiple anterior posterior infarcts, possible paradoxical cool emboli versus endocarditis Ruptured bleb with a right-sided pneumothorax chest tube in place Shock Possible lesion within the LVOT, TEE pending Severe protein calorie malnutrition  Plan: Restarting heparin yesterday Hemoglobin stable Just on Unasyn at this time Appreciate infectious disease input Patient neurology input MRI of the brain pending Continue tube feeds Continue goals of care discussion with patient's family.  This patient is critically ill with multiple organ system failure; which, requires frequent high complexity decision making, assessment, support, evaluation, and titration of therapies. This was completed through the application of advanced monitoring technologies and extensive interpretation of multiple databases. During this encounter critical care time was devoted to patient care services described in this note for 32 minutes.  Garner Nash, DO [place] 08/15/2019 2:45 PM

## 2019-08-15 NOTE — Progress Notes (Signed)
PCCM family communication  Multiple attempts 3/9 and 3/10 to reach family without success. Family evidently arrived to bedside 3/10; PCCM not notified of this and learned of this during routing PM rounding, by which time family had left. I see that Palliative care has scheduled family meeting for 08/16/2019 at 0900. I will alert the PCCM physician covering 2H.   Tessie Fass MSN, AGACNP-BC [place] [phone] If no answer, [phone] 08/15/2019, 5:30 PM

## 2019-08-15 NOTE — Progress Notes (Signed)
[facility]  Date of Admission:  07/28/2019     Total days of antibiotics 19         ASSESSMENT:  Charles Walters continues to remain obtunded and not following commands likely related to his CVA. Continues to require mechanical ventilation. No significant elevations in temperature since yesterday. Will hold on any additional blood cultures for now. Currently on Day 4 of amp/sulbactam. Will consider 1-2 more days of antibiotic therapy for presumed Enterococcus pneumonia. Continues to have leukocytosis which may or may not be related to infection at this point.   PLAN:  1. Continue current dose of amp/sulbactam. 2. Monitor fever curve and WBC count.  3. Mechanical ventilation per CCM.   Principal Problem:   Acute respiratory failure with hypoxia (HCC) Active Problems:   Leukocytosis   Hyponatremia   Intractable hiccups   Diarrhea   Asthenia   Essential hypertension   Community acquired bacterial pneumonia   Acute interstitial pneumonia (HCC)   Sinus tachycardia   Tobacco abuse   Severe protein-calorie malnutrition (HCC)   Acute deep vein thrombosis (DVT) of right lower extremity (HCC)   Anemia   Cerebral embolism with cerebral infarction   Spontaneous tension pneumothorax   . sodium chloride   Intravenous Once  . sodium chloride   Intravenous Once  . chlorhexidine gluconate (MEDLINE KIT)  15 mL Mouth Rinse BID  . Chlorhexidine Gluconate Cloth  6 each Topical Daily  . feeding supplement (PRO-STAT SUGAR FREE 64)  30 mL Per Tube Daily  . free water  200 mL Per Tube Q4H  . insulin aspart  0-6 Units Subcutaneous Q4H  . mouth rinse  15 mL Mouth Rinse 10 times per day  . methylPREDNISolone (SOLU-MEDROL) injection  60 mg Intravenous Q24H  . multivitamin  15 mL Per Tube Daily  . nicotine  7 mg Transdermal Daily  . pantoprazole (PROTONIX) IV  40 mg Intravenous Q24H  . sodium chloride flush  3 mL Intravenous Once  . thiamine injection  100 mg  Intravenous Daily    SUBJECTIVE:  Elevated temperature of 100.5 in the last 24 hours with continued leukocytosis of 38.8. No acute events overnight and remains on the ventilator.   No Known Allergies   Review of Systems: Review of Systems  Unable to perform ROS: Intubated      OBJECTIVE: Vitals:   08/15/19 0800 08/15/19 0817 08/15/19 0827 08/15/19 0900  BP: (!) 151/107 (!) 157/107  (!) 125/91  Pulse: 96 99  99  Resp: (!) 34 (!) 32  (!) 30  Temp:   98.6 F (37 C)   TempSrc:   Oral   SpO2: 100% 100%  100%  Weight:      Height:       Body mass index is 19.34 kg/m.  Physical Exam Constitutional:      General: He is not in acute distress.    Appearance: He is well-developed. He is ill-appearing.     Comments: Non-responsive on ventilator; head of bed elevated  Cardiovascular:     Rate and Rhythm: Normal rate and regular rhythm.     Heart sounds: Normal heart sounds.  Pulmonary:     Effort: Pulmonary effort is normal.     Breath sounds: Normal breath sounds.  Skin:    General: Skin is warm and dry.  Neurological:     Mental Status: He is oriented to person, place, and time.  Psychiatric:  Behavior: Behavior normal.        Thought Content: Thought content normal.        Judgment: Judgment normal.     Lab Results Lab Results  Component Value Date   WBC 38.8 (H) 08/15/2019   HGB 7.8 (L) 08/15/2019   HCT 24.5 (L) 08/15/2019   MCV 97.6 08/15/2019   PLT 191 08/15/2019    Lab Results  Component Value Date   CREATININE 1.10 08/15/2019   BUN 44 (H) 08/15/2019   NA 148 (H) 08/15/2019   K 5.1 08/15/2019   CL 109 08/15/2019   CO2 30 08/15/2019    Lab Results  Component Value Date   ALT 33 08/13/2019   AST 25 08/13/2019   ALKPHOS 71 08/13/2019   BILITOT 0.6 08/13/2019     Microbiology: Recent Results (from the past 240 hour(s))  MRSA PCR Screening     Status: None   Collection Time: 08/30/2019  5:01 AM   Specimen: Nasal Mucosa; Nasopharyngeal    Result Value Ref Range Status   MRSA by PCR NEGATIVE NEGATIVE Final    Comment:        The GeneXpert MRSA Assay (FDA approved for NASAL specimens only), is one component of a comprehensive MRSA colonization surveillance program. It is not intended to diagnose MRSA infection nor to guide or monitor treatment for MRSA infections. Performed at [facility], [place] [address], [place], [place] [address]   Culture, blood (Routine X 2) w Reflex to ID Panel     Status: None   Collection Time: 08/08/19  2:08 PM   Specimen: BLOOD  Result Value Ref Range Status   Specimen Description BLOOD RIGHT ANTECUBITAL  Final   Special Requests   Final    BOTTLES DRAWN AEROBIC ONLY Blood Culture results may not be optimal due to an inadequate volume of blood received in culture bottles   Culture   Final    NO GROWTH 5 DAYS Performed at [facility], [place] [address] [place], [place] [address]    Report Status 08/13/2019 FINAL  Final  Culture, blood (Routine X 2) w Reflex to ID Panel     Status: None   Collection Time: 08/08/19  2:09 PM   Specimen: BLOOD LEFT WRIST  Result Value Ref Range Status   Specimen Description BLOOD LEFT WRIST  Final   Special Requests   Final    BOTTLES DRAWN AEROBIC ONLY Blood Culture results may not be optimal due to an inadequate volume of blood received in culture bottles   Culture   Final    NO GROWTH 5 DAYS Performed at [facility], [place] [address], [place], [place] [address]    Report Status 08/13/2019 FINAL  Final  Acid Fast Smear (AFB)     Status: None   Collection Time: 08/11/19 12:17 PM   Specimen: Bronchial Alveolar Lavage  Result Value Ref Range Status   AFB Specimen Processing Concentration  Final   Acid Fast Smear Negative  Final    Comment: (NOTE) Performed At: [facility] [place], [place] [id number] Rush Farmer MD IY:[id number]    Source (AFB) BRONCHIAL ALVEOLAR LAVAGE  Final    Comment: LUL Performed at  [facility], [place] [address] [place], [place] [address]   Culture, respiratory     Status: None (Preliminary result)   Collection Time: 08/11/19 12:29 PM   Specimen: Bronchoalveolar Lavage; Respiratory  Result Value Ref Range Status  Specimen Description BRONCHIAL ALVEOLAR LAVAGE  Final   Special Requests NONE  Final   Gram Stain NO WBC SEEN NO ORGANISMS SEEN   Final   Culture   Final    RARE ENTEROCOCCUS FAECALIS REPEATING SUSCEPTIBILITIES WITH IN NORMAL RESPIRATORY FLORA Performed at [facility], [place] [address], [place], [place] [address]    Report Status PENDING  Incomplete  Pneumocystis smear by DFA     Status: None   Collection Time: 08/11/19 12:29 PM   Specimen: Bronchial Alveolar Lavage; Respiratory  Result Value Ref Range Status   Specimen Source-PJSRC BRONCHIAL ALVEOLAR LAVAGE  Final    Comment: LUL   Pneumocystis jiroveci Ag NEGATIVE  Final    Comment: Performed at [facility] Performed at [facility], [address] [place], [place] [address]   Culture, bal-quantitative     Status: Abnormal   Collection Time: 08/11/19 12:29 PM   Specimen: Bronchoalveolar Lavage; Respiratory  Result Value Ref Range Status   Specimen Description BRONCHIAL ALVEOLAR LAVAGE  Final   Special Requests NONE  Final   Gram Stain   Final    NO WBC SEEN NO ORGANISMS SEEN Performed at [facility], [address] [place], [place] [address]    Culture (A)  Final    2,000 COLONIES/mL ENTEROCOCCUS FAECALIS 1,000 COLONIES/mL CANDIDA ALBICANS    Report Status 08/15/2019 FINAL  Final   Organism ID, Bacteria ENTEROCOCCUS FAECALIS (A)  Final      Susceptibility   Enterococcus faecalis - MIC*    AMPICILLIN <=2 SENSITIVE Sensitive     VANCOMYCIN 1 SENSITIVE Sensitive     * 2,000 COLONIES/mL ENTEROCOCCUS FAECALIS  Acid Fast Smear (AFB)     Status: None   Collection Time: 08/11/19 12:29 PM   Specimen: Bronchial Alveolar Lavage  Result Value Ref Range Status    AFB Specimen Processing Concentration  Final   Acid Fast Smear Negative  Final    Comment: (NOTE) Performed At: [facility] [place], [place] [id number] Rush Farmer MD SL:[id number]    Source (AFB) BRONCHIAL ALVEOLAR LAVAGE  Final    Comment: RML Performed at [facility], [place] [address] [place], [place] [address]      Terri Piedra, [place] [facility] [phone] Pager  08/15/2019  9:58 AM

## 2019-08-15 NOTE — Progress Notes (Signed)
STROKE TEAM PROGRESS NOTE   INTERVAL HISTORY Pt still on vent, eyes half way open, not tracking not following commands, still SOB with using external muscles. Still on IV heparin. Pending palliative care GOC discussion tomorrow.    OBJECTIVE Vitals:   08/15/19 0800 08/15/19 0817 08/15/19 0827 08/15/19 0900  BP: (!) 151/107 (!) 157/107  (!) 125/91  Pulse: 96 99  99  Resp: (!) 34 (!) 32  (!) 30  Temp:   98.6 F (37 C)   TempSrc:   Oral   SpO2: 100% 100%  100%  Weight:      Height:        CBC:  Recent Labs  Lab 08/11/19 0345 08/11/19 1313 08/12/19 0318 08/13/19 0045 08/14/19 0303 08/15/19 0433  WBC 21.2*   < > 60.5*   < > 33.3* 38.8*  NEUTROABS 17.5*  --  51.3*  --   --   --   HGB 6.9*   < > 9.3*   < > 7.9* 7.8*  HCT 20.5*   < > 29.2*   < > 24.8* 24.5*  MCV 92.8   < > 99.0   < > 96.9 97.6  PLT 187   < > 201   < > 181 191   < > = values in this interval not displayed.    Basic Metabolic Panel:  Recent Labs  Lab 08/11/19 0345 08/11/19 0345 08/11/19 1313 08/11/19 2036 08/12/19 0318 08/12/19 0518 08/14/19 0303 08/15/19 0433  NA 139   < >   < >  --  142   < > 143 148*  K 4.6   < >   < >  --  6.7*   < > 5.4* 5.1  CL 107   < >  --   --  108   < > 105 109  CO2 24   < >  --   --  22   < > 29 30  GLUCOSE 112*   < >  --   --  117*   < > 152* 150*  BUN 33*   < >  --   --  39*   < > 46* 44*  CREATININE 0.76   < >  --   --  1.17   < > 1.25* 1.10  CALCIUM 8.8*   < >  --   --  8.6*   < > 8.6* 8.5*  MG 1.9  --   --  2.0 2.1  --   --   --   PHOS 3.3  --   --  7.1*  --   --   --   --    < > = values in this interval not displayed.   Lipid Panel:     Component Value Date/Time   CHOL 90 08/13/2019 0439   TRIG 112 08/13/2019 0439   HDL 32 (L) 08/13/2019 0439   CHOLHDL 2.8 08/13/2019 0439   VLDL 22 08/13/2019 0439   LDLCALC 36 08/13/2019 0439   HgbA1c:  Lab Results  Component Value Date   HGBA1C 6.0 (H) 08/13/2019   Urine Drug Screen:     Component Value Date/Time    LABOPIA NONE DETECTED 08/11/2019 1328   COCAINSCRNUR NONE DETECTED 08/11/2019 1328   LABBENZ NONE DETECTED 08/11/2019 1328   AMPHETMU NONE DETECTED 08/11/2019 1328   THCU POSITIVE (A) 08/11/2019 1328   LABBARB NONE DETECTED 08/11/2019 1328    Alcohol Level     Component Value Date/Time  ETH <10 07/22/2019 1633    IMAGING past 24h DG CHEST PORT 1 VIEW  Result Date: 08/15/2019 CLINICAL DATA:  Respiratory failure. EXAM: PORTABLE CHEST 1 VIEW COMPARISON:  08/13/2019 FINDINGS: The endotracheal tube is 4.5 cm above the carina. The NG tube is stable. The left PICC line is stable. Persistent diffuse interstitial and airspace process in the lungs and stable right-sided chest tube. No definite pneumothorax. IMPRESSION: 1. Stable support apparatus. 2. Persistent diffuse interstitial and airspace process. Electronically Signed   By: Rudie Meyer M.D.   On: 08/15/2019 09:22     PHYSICAL EXAM  Temp:  [98.3 F (36.8 C)-100.5 F (38.1 C)] 98.6 F (37 C) (03/10 0827) Pulse Rate:  [11-114] 99 (03/10 0900) Resp:  [29-40] 30 (03/10 0900) BP: (118-161)/(75-109) 125/91 (03/10 0900) SpO2:  [94 %-100 %] 100 % (03/10 0900) FiO2 (%):  [50 %] 50 % (03/10 0817) Weight:  [66.5 kg] 66.5 kg (03/10 0418)  General - Well nourished, well developed, intubated not on sedation.  Ophthalmologic - fundi not visualized due to small pupils.  Cardiovascular - Regular rhythm, but tachycardia.  Respiratory - triggers vent, but SOB using external muscles  Neuro - intubated not on sedation, eyes spontaneous open, not following commands. Eyes able to open bigger on voice but not following any further commands. With eye opening, eyes spontaneous rolling horizontally, not blinking to visual threat, not tracking, b/l pupil 22mm, very sluggish to light. Corneal reflex bilateral very weak, gag and cough nearly diminished. Breathing over the vent.  Facial symmetry not able to test due to ET tube.  Tongue protrusion not  cooperative. On pain stimulation, no movement of all extremities. DTR 1+ and no babinski. Sensation, coordination and gait not tested.   ASSESSMENT/PLAN Charles Walters is a 59 y.o. male with history of HTN and tobacco use admitted to [facility] February 17 with intractable hiccups, loss of appetite and diarrhea - complicated hospital course with dyspnea -> abnormal CXR -> resp failure -> intubation -> bradycardia -> asystole -> CPR -> TPA for possible PE -> hypotension -> AMS -> CT -> large right MCA territory stroke.  He receive IV t-PA for possible pulmonary embolism not for stroke.  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 Cardiac arrest s/p CPR vs. endocarditis  Resultant comatose state with poor neurological exam  CT head - Acute infarction in the right middle cerebral artery territory, with sparing of the basal ganglia. Early brain swelling and low density.   CTA head marked hypoattenuation multiple M2 and distal R MCA branch vessels w/ occlusion multiple Proximal R M2 branches. Calcified plaque intracranial ICAs. Moderate stenosis paraclinoid R ICA.  CTA neck L>R ICA bifurcation plaque. 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 R>L airspace dz lung apices. Mildly displaced fx R 2nd rib.  MRI head - multiple b/l vascular territory infarcts w/ large R MCA infarct   2D Echo 2/18-  EF 60-65%. No source of embolus   BLE Venous Dopplers - Right LE DVT  EEG diffuse encephalopathy, no seizure   2D echo w/ bubble 3/9 possible oscillating density LVOT. EF 55-60%. RA dilated. Neg bubble study.  May consider TCD bubble study or TEE if neuro improvement  2D w/ bubble neg for PFO  [name] - negative  LDL - 36  HgbA1c - 6.0   UDS - positive for [facility]  VTE prophylaxis - Heparin IV  No antithrombotic prior to admission, now on heparin IV (briefly on hold 3/8  given anemia requiring transfusion)  Poor neurologic outcome, recommend palliative care for [place] discussion   Asystolic cardiac arrest, PEA arrest  S/p CPR  On IV heparin - briefly off 3/8 due to severe anemia requiring PRBC  Acute Hypoxemic Hypercarbic Respiratory Failure Bilateral pneumonia  R Hydropneumothorax Leukocytosis and fever  Intubated   Leukocytosis w/ bandemia - 21.2->48.4->60.5->42.3->33.6->33.3->33.8  Tmax 100.1->101.1  ID on board  on unasyn   Off bactrium  s/p CT placement  Right LE DVT  Suspected pulmonary embolus - not proven  BLE Venous Dopplers - Right LE DVT  Received tPA for possible PE  Transitioned from full dose lovenox to IV heparin s/p cardiac arrest  On IV heparin (briefly on hold 3/8 given anemia requiring transfusion)  Septic shock vs Cardiogenic Sepsis Hx Hypertension  Home BP meds: HCTZ ; Norvasc ; Cozaar  Current BP meds: Levophed  off pressor now . Avoid low BP  Anemia   Hb 9.3->7.5->7.0->7.1->6.6-> PRBC ->7.9->7.8  normocytic s/p CT placement  Close monitoring  Continue on heparin   Dysphagia Severe Protein Calorie Malnutrition . NPO . On tube feeds @ 60  Tobacco abuse  Current smoker  Smoking cessation counseling may be provided  Nicotine patch provided  Other Stroke Risk Factors  ETOH use, advised to drink no more than 1 alcoholic beverage per day.  Other Active Problems  Code status - Full Code  Tachycardia - Lopressor  Hyperkalemia - 6.3->4.7->4.8->4.9->5.0->5.1->5.1  AKI Cre 1.43->1.39->1.25->1.10  Hospital day # 21   Rosalin Hawking, MD PhD Stroke Neurology 08/15/2019 10:31 AM   To contact Stroke Continuity provider, please refer to [email or web address] After hours, contact General Neurology

## 2019-08-15 NOTE — Progress Notes (Signed)
  Patient ID: Charles Walters, male   DOB: 02/08/1961, 59 y.o.   MRN: [id number]     Thank you for consulting the Palliative Medicine Team at [facility] to meet your patient's and family's needs.   The reason that you asked [place] to see your patient:  Establish Goals of Care  We have scheduled a meeting for tomorrow March, 11, 2021 at 0900 am with family members to include:   -  ( 3) brothers    Charles Walters  # [phone], Charles Walters and Charles Walters.      -  Daughter Charles Walters will also be present  Plan is to meet in the waiting room outside [facility] medical unit   Lorinda Creed NP  Palliative Medicine Team Team Phone # [phone]  No charge

## 2019-08-15 NOTE — Progress Notes (Addendum)
ANTICOAGULATION CONSULT NOTE - Follow Up Consult  Pharmacy Consult for heparin Indication: VTE in setting of CVA  Labs: Recent Labs    08/12/19 0518 08/13/19 0439 08/13/19 0439 08/13/19 0825 08/13/19 1315 08/13/19 1315 08/13/19 1500 08/14/19 0303 08/14/19 1800 08/15/19 0059  HGB  --  7.0*   < >  --  7.1*   < > 6.6* 7.9*  --   --   HCT  --  22.4*   < >  --  21.0*  --  21.2* 24.8*  --   --   PLT  --  187  --   --   --   --  191 181  --   --   HEPARINUNFRC  --  <0.10*  --   --   --   --   --   --  0.19* 0.61  CREATININE   < >  --   --  1.43*  --   --  1.39* 1.25*  --   --    < > = values in this interval not displayed.    Assessment: 59yo male now supratherapeutic on heparin after rate change.  Goal of Therapy:  Heparin level 0.3-0.5 units/ml   Plan:  Will decrease heparin gtt to 1250 units/hr and check level in 6 hours.    Vernard Gambles, PharmD, BCPS  08/15/2019,1:39 AM   Addendum: Heparin level this am drawn early but low at 0.27; will increase heparin gtt slightly to 1300 units/hr and check level in 6hr.  VB 5:31 AM

## 2019-08-16 DIAGNOSIS — Z66 Do not resuscitate: Secondary | ICD-10-CM

## 2019-08-16 DIAGNOSIS — Z9911 Dependence on respirator [ventilator] status: Secondary | ICD-10-CM

## 2019-08-16 DIAGNOSIS — I63519 Cerebral infarction due to unspecified occlusion or stenosis of unspecified middle cerebral artery: Secondary | ICD-10-CM

## 2019-08-16 DIAGNOSIS — E870 Hyperosmolality and hypernatremia: Secondary | ICD-10-CM

## 2019-08-16 DIAGNOSIS — J9312 Secondary spontaneous pneumothorax: Secondary | ICD-10-CM

## 2019-08-16 DIAGNOSIS — J9600 Acute respiratory failure, unspecified whether with hypoxia or hypercapnia: Secondary | ICD-10-CM

## 2019-08-16 LAB — CULTURE, RESPIRATORY W GRAM STAIN: Gram Stain: NONE SEEN

## 2019-08-16 LAB — BASIC METABOLIC PANEL
Anion gap: 8 (ref 5–15)
BUN: 46 mg/dL — ABNORMAL HIGH (ref 6–20)
CO2: 33 mmol/L — ABNORMAL HIGH (ref 22–32)
Calcium: 8.6 mg/dL — ABNORMAL LOW (ref 8.9–10.3)
Chloride: 110 mmol/L (ref 98–111)
Creatinine, Ser: 0.88 mg/dL (ref 0.61–1.24)
GFR calc Af Amer: >60 mL/min (ref >60)
GFR calc non Af Amer: >60 mL/min (ref >60)
Glucose, Bld: 139 mg/dL — ABNORMAL HIGH (ref 70–99)
Potassium: 5.5 mmol/L — ABNORMAL HIGH (ref 3.5–5.1)
Sodium: 151 mmol/L — ABNORMAL HIGH (ref 135–145)

## 2019-08-16 LAB — CBC
HCT: 25.9 % — ABNORMAL LOW (ref 39.0–52.0)
Hemoglobin: 7.9 g/dL — ABNORMAL LOW (ref 13.0–17.0)
MCH: 30.9 pg (ref 26.0–34.0)
MCHC: 30.5 g/dL (ref 30.0–36.0)
MCV: 101.2 fL — ABNORMAL HIGH (ref 80.0–100.0)
Platelets: 219 10*3/uL (ref 150–400)
RBC: 2.56 MIL/uL — ABNORMAL LOW (ref 4.22–5.81)
RDW: 16 % — ABNORMAL HIGH (ref 11.5–15.5)
WBC: 38.1 10*3/uL — ABNORMAL HIGH (ref 4.0–10.5)
nRBC: 0.3 % — ABNORMAL HIGH (ref 0.0–0.2)

## 2019-08-16 LAB — GLUCOSE, CAPILLARY
Glucose-Capillary: 118 mg/dL — ABNORMAL HIGH (ref 70–99)
Glucose-Capillary: 124 mg/dL — ABNORMAL HIGH (ref 70–99)
Glucose-Capillary: 140 mg/dL — ABNORMAL HIGH (ref 70–99)
Glucose-Capillary: 145 mg/dL — ABNORMAL HIGH (ref 70–99)
Glucose-Capillary: 146 mg/dL — ABNORMAL HIGH (ref 70–99)
Glucose-Capillary: 147 mg/dL — ABNORMAL HIGH (ref 70–99)

## 2019-08-16 LAB — HEPARIN LEVEL (UNFRACTIONATED)
Heparin Unfractionated: 0.32 IU/mL (ref 0.30–0.70)
Heparin Unfractionated: 0.42 IU/mL (ref 0.30–0.70)

## 2019-08-16 NOTE — Progress Notes (Signed)
ANTICOAGULATION CONSULT NOTE  Pharmacy Consult for heparin Indication: DVT , R/o PE No Known Allergies  Patient Measurements: Height: 6\' 1"  (185.4 cm) Weight: 150 lb 2.1 oz (68.1 kg) IBW/kg (Calculated) : 79.9 Heparin Dosing Weight: 64kg  Vital Signs: Temp: 97.9 F (36.6 C) (03/11 1546) Temp Source: Oral (03/11 1546) BP: 108/79 (03/11 1400) Pulse Rate: 120 (03/11 1400)  Labs: Recent Labs    08/14/19 0303 08/14/19 1800 08/15/19 0433 08/15/19 1203 08/15/19 2130 08/16/19 0605 08/16/19 1520  HGB 7.9*  --  7.8*  --   --  7.9*  --   HCT 24.8*  --  24.5*  --   --  25.9*  --   PLT 181  --  191  --   --  219  --   HEPARINUNFRC  --    < > 0.27*   < > 0.23* 0.32 0.42  CREATININE 1.25*  --  1.10  --   --  0.88  --    < > = values in this interval not displayed.    Estimated Creatinine Clearance: 89.2 mL/min (by C-G formula based on SCr of 0.88 mg/dL).  Assessment: 59 year old male with newly diagnosed DVT 3/5 placed on full dose lovenox 3/5 now s/p cardiac arrest with tPA administered 3/6. CT head with acute infarct, per neuro ok to keep IV heparin for now, will utilize no bolus and low goal.  Heparin level is therapeutic at 0.42 - within goal  Cbc stable this am  Goal of Therapy:  Heparin level 0.3-0.5 units/ml Monitor platelets by anticoagulation protocol: Yes   Plan:  Continue heparin 1550 units/hr Daily heparin level and CBC   59, PharmD, BCPS, BCCCP Clinical Pharmacist [phone]  Please check AMION for all [facility] Pharmacy numbers  08/16/2019 4:04 PM

## 2019-08-16 NOTE — Consult Note (Signed)
Consultation Note Date: 08/16/2019   Patient Name: Charles Walters  DOB: 04-11-1961  MRN: [id number]  Age / Sex: 59 y.o., male  PCP: Placey, Audrea Muscat, NP Referring Physician: Garner Nash, DO  Reason for Consultation: Establishing goals of care and Psychosocial/spiritual support  HPI/Patient Profile: 59 y.o. male  admitted on 07/13/2019 with hiccups, loss of appetite, hyponatremia, and diarrhea.  Reported shortness of breath on exertion.  Initial chest xray grossly abnormal, initially treated by critical care team empirically for community-acquired pneumonia.   Continued to decline with worsening hypoxemia, repeat chest CT showed persistent bilateral lower lobe predominant groundglass opacities with interlobular septal thickening. Patient was intubated for bronchoscopy and subsequently had a PEA cardiac arrest.  He also has suffered a large MCA stroke, with multiple anterior posterior infarcts.  Today is day 81 of hospitalization  Patient remains intubated and unresponsive.  Patient continues to fail to improve in spite of maximal medical management.  Family face treatment option decisions, advanced directive decisions and anticipatory care needs.  Clinical Assessment and Goals of Care:   This NP Wadie Lessen reviewed medical records, received report from team, assessed the patient and then meet with  patient's family (patient's 2 daughters Charles Walters and Charles Walters,  3 brothers Charles Walters and Sister Charles Walters) to discuss diagnosis, prognosis, GOC, EOL wishes disposition and options.  Concept of Palliative Care was discussed  Dr. Valeta Harms updated the family on medical situation and the terminal nature of his conditions  Created space and opportunity for family to explore their thoughts and feelings regarding current medical situation.  All family members present today are from out of state and were only made  aware of the patient's hospitalization 1 week ago from his live-in significant other.  Understandably they verbalize their frustration  with the situation. They express their love for their father and brother.  A detailed discussion was had today regarding advanced directives.  Concepts specific to code status, artifical feeding and hydration, continued IV antibiotics and rehospitalization was had.  The difference between a aggressive medical intervention path  and a palliative comfort care path for this patient at this time was had.  Values and goals of care important to patient and family were attempted to be elicited.  Family understand the seriousness of the current medical situation and the difficult decisions they face.  They had this unfortunate situation only a few months ago with her brother Charles Walters in [place].  Natural trajectory and expectations at EOL were discussed.  Questions and concerns addressed.   Family encouraged to call with questions or concerns.    Family plan to talk and get back to me later today for decisions regarding path of care.   PMT will continue to support holistically.    Late entry-----daughter called back with family decision to shift to a comfort and dignity path and plan is to liberate Mr  Charles Walters from the ventilator tomorrow early afternoon. Daughter/Charles Walters and her uncle plan to come to the hospital tomorrow and want to be present  for the extubation. Shared with family that I will not be working tomorrow and that my co-worker will be available to support them through the process    No documented healthcare power of attorney.  Patient has 2 daughters [name] # [phone] and Charles Walters [phone] who will act as main decision makers for this patient with the support of the entire family.     SUMMARY OF RECOMMENDATIONS    Code Status/Advance Care Planning:  DNR/DNI   After discussion today the 2 daughters have made decision to  continue current medical interventions with no escalation of care.  No CPR, no compressions, no ACLS medications  Family plan to talk and get back to me later today for decisions regarding path of care.   Palliative Prophylaxis:   Aspiration, Bowel Regimen, Eye Care, Frequent Pain Assessment and Oral Care  Additional Recommendations (Limitations, Scope, Preferences):  Continue current interventions with no escalation of care until family can talk and make further decisions  Plan is for one way extubation tomorrow and shift to comfort path  Psycho-social/Spiritual:   Desire for further Chaplaincy support:  yes  Additional Recommendations: Grief/Bereavement Support  Prognosis:   Once extubated- prognosis is likely hours to days  Discharge Planning: Anticipated Hospital Death      Primary Diagnoses: Present on Admission: . Hyponatremia . Intractable hiccups . Diarrhea . Essential hypertension . Community acquired bacterial pneumonia . Acute respiratory failure with hypoxia ([place]) . Leukocytosis . Sinus tachycardia . Tobacco abuse . Severe protein-calorie malnutrition ([place])   I have reviewed the medical record, interviewed the patient and family, and examined the patient. The following aspects are pertinent.  Past Medical History:  Diagnosis Date  . Hypertension    Social History   Socioeconomic History  . Marital status: Single    Spouse name: Not on file  . Number of children: Not on file  . Years of education: Not on file  . Highest education level: Not on file  Occupational History  . Not on file  Tobacco Use  . Smoking status: Current Every Day Smoker  . Smokeless tobacco: Never Used  Substance and Sexual Activity  . Alcohol use: Yes  . Drug use: No  . Sexual activity: Not on file  Other Topics Concern  . Not on file  Social History Narrative  . Not on file   Social Determinants of Health   Financial Resource Strain:   . Difficulty of Paying  Living Expenses:   Food Insecurity:   . Worried About Charity fundraiser in the Last Year:   . Arboriculturist in the Last Year:   Transportation Needs:   . Film/video editor (Medical):   [name] Lack of Transportation (Non-Medical):   Physical Activity:   . Days of Exercise per Week:   . Minutes of Exercise per Session:   Stress:   . Feeling of Stress :   Social Connections:   . Frequency of Communication with Friends and Family:   . Frequency of Social Gatherings with Friends and Family:   . Attends Religious Services:   . Active Member of Clubs or Organizations:   . Attends Archivist Meetings:   [name] Marital Status:    History reviewed. No pertinent family history. Scheduled Meds: . sodium chloride   Intravenous Once  . sodium chloride   Intravenous Once  . chlorhexidine gluconate (MEDLINE KIT)  15 mL Mouth Rinse BID  . Chlorhexidine Gluconate Cloth  6 each Topical  Daily  . feeding supplement (PRO-STAT SUGAR FREE 64)  30 mL Per Tube Daily  . free water  200 mL Per Tube Q4H  . insulin aspart  0-6 Units Subcutaneous Q4H  . mouth rinse  15 mL Mouth Rinse 10 times per day  . methylPREDNISolone (SOLU-MEDROL) injection  40 mg Intravenous Q24H  . multivitamin  15 mL Per Tube Daily  . nicotine  7 mg Transdermal Daily  . pantoprazole (PROTONIX) IV  40 mg Intravenous Q24H  . sodium chloride flush  3 mL Intravenous Once  . thiamine injection  100 mg Intravenous Daily   Continuous Infusions: . ampicillin-sulbactam (UNASYN) IV 3 g (08/16/19 0801)  . feeding supplement (OSMOLITE 1.2 CAL) 60 mL/hr at 08/15/19 0600  . heparin 1,500 Units/hr (08/16/19 0600)   PRN Meds:.acetaminophen (TYLENOL) oral liquid 160 mg/5 mL **OR** acetaminophen, albuterol, labetalol, metoCLOPramide, midazolam, Resource ThickenUp Clear, sodium chloride flush, sodium chloride flush, white petrolatum Medications Prior to Admission:  Prior to Admission medications   Medication Sig Start Date End Date  Taking? Authorizing Provider  hydrochlorothiazide (HYDRODIURIL) 25 MG tablet Take 25 mg by mouth daily. 06/07/19  Yes [provider]  losartan (COZAAR) 25 MG tablet Take 25 mg by mouth daily. 04/12/19  Yes [provider]  sildenafil (REVATIO) 20 MG tablet Take 40 mg by mouth daily as needed (for E.D.).  04/12/19  Yes [provider]  amLODipine (NORVASC) 10 MG tablet Take 1 tablet (10 mg total) by mouth daily. 07/29/19 09/27/19  Cherylann Ratel A, DO  amLODipine (NORVASC) 5 MG tablet Take 1 tablet (5 mg total) by mouth daily. Patient not taking: Reported on 07/17/2019 11/24/16   Lysbeth Penner, FNP  methylPREDNISolone (MEDROL DOSEPAK) 4 MG TBPK tablet 6-5-4-3-2-1 po qd Patient not taking: Reported on 07/26/2019 11/24/16   Lysbeth Penner, FNP   No Known Allergies Review of Systems  Unable to perform ROS: Intubated    Physical Exam Constitutional:      Appearance: He is cachectic. He is ill-appearing.     Interventions: He is intubated.  Pulmonary:     Effort: He is intubated.     Vital Signs: BP (!) 115/91   Pulse (!) 112   Temp 98.2 F (36.8 C) (Oral)   Resp (!) 30   Ht _0  (1.854 m)   Wt 68.1 kg   SpO2 100%   BMI 19.81 kg/m  Pain Scale: CPOT POSS *See Group Information*: 1-Acceptable,Awake and alert Pain Score: 0-No pain   SpO2: SpO2: 100 % O2 Device:SpO2: 100 % O2 Flow Rate: .O2 Flow Rate (L/min): 100 L/min  IO: Intake/output summary:   Intake/Output Summary (Last 24 hours) at 08/16/2019 0818 Last data filed at 08/16/2019 0600 Gross per 24 hour  Intake 2105.8 ml  Output 1540 ml  Net 565.8 ml    LBM: Last BM Date: 08/11/19 Baseline Weight: Weight: 79.4 kg Most recent weight: Weight: 68.1 kg     Palliative Assessment/Data:   Discussed with Dr. Cammie Mcgee NP and bedside Rn   Time In: 0900 Time Out: 1030 Time Total: 90 minutes Greater than 50%  of this time was spent counseling and coordinating care related to the above  assessment and plan.  Signed by: Wadie Lessen, NP   Please contact Palliative Medicine Team phone at [phone] for questions and concerns.  For individual provider: See Shea Evans

## 2019-08-16 NOTE — Progress Notes (Signed)
[facility] for Infectious Disease    Date of Admission:  07/19/2019   Total days of antibiotics 5 amp/sub           ID: Charles Walters is a 59 y.o. male with   Principal Problem:   Acute respiratory failure with hypoxia ([place]) Active Problems:   Hyponatremia   Intractable hiccups   Diarrhea   Asthenia   Essential hypertension   Community acquired bacterial pneumonia   Acute interstitial pneumonia (HCC)   Leukocytosis   Sinus tachycardia   Tobacco abuse   Severe protein-calorie malnutrition (HCC)   Acute deep vein thrombosis (DVT) of right lower extremity (HCC)   Anemia   Cerebral embolism with cerebral infarction   Spontaneous tension pneumothorax   Endotracheally intubated    Subjective: Continues to be unresponsive and afebrile. Family meeting with palliative care in process  Medications:  . sodium chloride   Intravenous Once  . sodium chloride   Intravenous Once  . chlorhexidine gluconate (MEDLINE KIT)  15 mL Mouth Rinse BID  . Chlorhexidine Gluconate Cloth  6 each Topical Daily  . feeding supplement (PRO-STAT SUGAR FREE 64)  30 mL Per Tube Daily  . free water  200 mL Per Tube Q4H  . insulin aspart  0-6 Units Subcutaneous Q4H  . mouth rinse  15 mL Mouth Rinse 10 times per day  . methylPREDNISolone (SOLU-MEDROL) injection  40 mg Intravenous Q24H  . multivitamin  15 mL Per Tube Daily  . nicotine  7 mg Transdermal Daily  . pantoprazole (PROTONIX) IV  40 mg Intravenous Q24H  . sodium chloride flush  3 mL Intravenous Once  . thiamine injection  100 mg Intravenous Daily    Objective: Vital signs in last 24 hours: Temp:  [98.2 F (36.8 C)-99.6 F (37.6 C)] 99.6 F (37.6 C) (03/11 1117) Pulse Rate:  [80-136] 120 (03/11 1400) Resp:  [19-50] 35 (03/11 1400) BP: (107-147)/(75-96) 108/79 (03/11 1400) SpO2:  [68 %-100 %] 98 % (03/11 1400) FiO2 (%):  [40 %-50 %] 40 % (03/11 1200) Weight:  [68.1 kg] 68.1 kg (03/11 0500)     Lab Results Recent Labs   08/15/19 0433 08/16/19 0605  WBC 38.8* 38.1*  HGB 7.8* 7.9*  HCT 24.5* 25.9*  NA 148* 151*  K 5.1 5.5*  CL 109 110  CO2 30 33*  BUN 44* 46*  CREATININE 1.10 0.88   Liver Panel Recent Labs    08/13/19 1500  PROT 5.6*  ALBUMIN 1.6*  AST 25  ALT 33  ALKPHOS 71  BILITOT 0.6    Microbiology: reviewed Studies/Results: DG CHEST PORT 1 VIEW  Result Date: 08/15/2019 CLINICAL DATA:  Respiratory failure. EXAM: PORTABLE CHEST 1 VIEW COMPARISON:  08/13/2019 FINDINGS: The endotracheal tube is 4.5 cm above the carina. The NG tube is stable. The left PICC line is stable. Persistent diffuse interstitial and airspace process in the lungs and stable right-sided chest tube. No definite pneumothorax. IMPRESSION: 1. Stable support apparatus. 2. Persistent diffuse interstitial and airspace process. Electronically Signed   By: Marijo Sanes M.D.   On: 08/15/2019 09:22     Assessment/Plan: Mr Charles Walters is a 59yo M with severe protein caloric malnutrition, with interstitial pneumonia who developed respiratory failure required intubation. cx showing small amount of  Enterococcus faecalis, Candida albicans likely normal flora. Course subsequently complicated by PEA cardiac arrest and Large MCA stroke, multiple anterior posterior infarcts, possible paradoxical cool emboli versus endocarditis, as well as  right-sided pneumothorax chest tube  in place. Has not had meaningful recovery from stroke. Still not alert despite no sedation since stroke  - continue on amp/sub for now for pneumonia coverage, which would also possible cover culture negative endocarditis though I have low suspicion - await goals of care discussion for further recommendations  [facility] for Infectious Diseases Cell: [phone] Pager: [phone]  08/16/2019, 2:54 PM

## 2019-08-16 NOTE — Progress Notes (Signed)
PULMONARY / CRITICAL CARE MEDICINE   NAME:  Charles Walters, MRN:  [id number], DOB:  April 11, 1961, LOS: 13 ADMISSION DATE:  07/26/2019, CONSULTATION DATE:  07/30/2019 REFERRING MD:  Grandville Silos, CHIEF COMPLAINT:  Hypoxemic respiratory failure  BRIEF HISTORY:    The patient is a 59 year old gentleman who presented 07/21/2019 with hiccups, loss of appetite, hyponatremia, watery diarrhea.  He was also short of breath on exertion.  He had a grossly abnormal chest x-ray and was found to be incidentally hypoxemic.  He was seen initially by pulmonary critical care team and treated empirically for community-acquired pneumonia.  However over the last week he has continued to decline, had worsening hypoxemia, and had a repeat CT chest which shows persistent bilateral lower lobe predominant groundglass opacities with interlobular septal thickening.  Is being called back to evaluate these opacities in the setting of hypoxemic respiratory failure.  SIGNIFICANT PAST MEDICAL HISTORY   Hypertension  SIGNIFICANT EVENTS:  2/22: PCCM consulted and recommended for CAP coverage 3/1: PCCM consulted for persistent infiltrates and hypoxemia, planned for bronch AM of 3/2 for which transferred to ICU; bronch cancelled for worsening O2 requirements  3/6 intubated for bronchoscopy 3/6 cardiac arrest overnight  3/7 chest tube placed for R tension pneumothorax 3/9 weaning   STUDIES:   2/17: CT Chest wo Contrast: multifocal pneumonia, viral vs atypical; mild aneurysmal dilation of ascending aorta up to 4.2cm  2/18: CT Angio Chest: No PE; nonspecific multifocal pneumonia of atypical etiology 3/1: CT Chest wo Contrast: extensive worsening crazy paving pattern (ground glass opacity + interlobular septal thickening) throughout peripheral lungs bilaterally involving all lung lobes; new patchy regions of consolidative airspace disease in lungs bilaterally; most prominent in lower lobes. Given interval worsening, considerations include acute  interstitial pneumonia/ARDS, atypical infection or pulmonary alveolar proteinosis; small dependent bilateral pleural effusions; dilated main pulmonary artery suggesting pulmonary arterial hypertension; stable ascending thoracic aorta aneurysm.  CXR 3/8 improved aeration throughout the right lung, incompletely reexpanded.  Pleural catheter in appropriate position.  Persistent airspace disease bilaterally.  ETT about 2 cm above the carina.  CULTURES:  Cultures no growth to date Respiratory virus panel negative MRSA PCR negative Covid negative 3/3: Blood cultures x2> neg to date 3/3: Urine legionella>  3/3: Urine strep>  3/6: BAL>> E faecalis. 3/6 pneumocystis smear> neg 3/6 BAL AFB >> 3/6 BAL fungus>   ANTIBIOTICS:  Vancomycin 2/22> 3/2 Cefepime 2/25> 3/2 Bactrim 3/2 Azithromycin 3/2 Unasyn 3/7>> anidulafingin 3/7-3/8 Ceftriaxone 3/7-3/8 Bactrim 3/7 - 3/9  CONSULTANTS:  PCCM Neurology ID  SUBJECTIVE:   Remains intubated on mechanical life support.  Family at bedside this morning for visitation.   CONSTITUTIONAL: BP (!) 120/91   Pulse (!) 110   Temp 99.6 F (37.6 C) (Oral)   Resp (!) 38   Ht _0  (1.854 m)   Wt 68.1 kg   SpO2 98%   BMI 19.81 kg/m   I/O last 3 completed shifts: In: 3211.5 [I.V.:491.8; NG/GT:2220; IV Piggyback:499.7] Out: 2570 [Urine:2150; Chest Tube:420]  Vent Mode: PRVC FiO2 (%):  [40 %-50 %] 40 % Set Rate:  [22 bmp] 22 bmp Vt Set:  [480 mL] 480 mL PEEP:  [8 cmH20] 8 cmH20 Plateau Pressure:  [24 cmH20-29 cmH20] 28 cmH20  CBC Latest Ref Rng & Units 08/16/2019 08/15/2019 08/14/2019  WBC 4.0 - 10.5 K/uL 38.1(H) 38.8(H) 33.3(H)  Hemoglobin 13.0 - 17.0 g/dL 7.9(L) 7.8(L) 7.9(L)  Hematocrit 39.0 - 52.0 % 25.9(L) 24.5(L) 24.8(L)  Platelets 150 - 400 K/uL 219 191 181  CMP Latest Ref Rng & Units 08/16/2019 08/15/2019 08/14/2019  Glucose 70 - 99 mg/dL 139(H) 150(H) 152(H)  BUN 6 - 20 mg/dL 46(H) 44(H) 46(H)  Creatinine 0.61 - 1.24 mg/dL 0.88 1.10  1.25(H)  Sodium 135 - 145 mmol/L 151(H) 148(H) 143  Potassium 3.5 - 5.1 mmol/L 5.5(H) 5.1 5.4(H)  Chloride 98 - 111 mmol/L 110 109 105  CO2 22 - 32 mmol/L 33(H) 30 29  Calcium 8.9 - 10.3 mg/dL 8.6(L) 8.5(L) 8.6(L)  Total Protein 6.5 - 8.1 g/dL - - -  Total Bilirubin 0.3 - 1.2 mg/dL - - -  Alkaline Phos 38 - 126 U/L - - -  AST 15 - 41 U/L - - -  ALT 0 - 44 U/L - - -   PHYSICAL EXAM: General: Chronically ill intubated on mechanical life support  Neuro: Does not follow commands no response to pain HEENT: No proximal muscle wasting trachea midline ET tube in place Cardiovascular: Tachycardic, regular S1-S2 Lungs: Chest tube in place bilateral mechanically ventilated breath sounds Abdomen: Soft, nontender nondistended  musculoskeletal: Muscle wasting no edema  RESOLVED PROBLEM LIST   ASSESSMENT AND PLAN    Asystolic cardiac arrest, PEA cardiac arrest. -14 min code.  Due to concern for PE from DVT, was given TPA postcode.  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 Differential diagnosis follows that of "crazy paving" Areas of groundglass with interlobular septal thickening. Presumed enterococcus faecalis PNA Right-sided pneumothorax s/p chest tube placement Tachypnea -wonder if this is neurogenic in nature?  P -Remains on full mechanical support -Mental status precludes liberation from vent -Chest tube in place  Shock, improved   Likely septic versus less likely cardiogenic (LVEF 55 to 60%, mildly enlarged RV & RA-unclear if this is from a PE or acute hypoxic respiratory failure induced hypoxic vasoconstriction). Not currently on sedation. -Continue to monitor off pressors  Abnormal echocardiogram-possible mobile density in LVOT -Consult cardiology for possible TEE for further evaluation, they will defer until [place] have  been further clarified with family   Acute metabolic encephalopathy likely 2/2 anoxic brain injury, large CVA -Off sedation  R acute MCA stroke- presumed embolic.  Postcode EEG with profound diffuse encephalopathy, but no seizures or epileptiform discharges.  Early edema on CT head. P -appreciate neuro input   AKI, improved Hyperkalemia, improved Hypernatremia, mild P --follow uop - follow bmp   Leukocytosis w/bandemia: Intermittent fever  -- on unasyn - appreciate ID input   Acute right lower extremity DVT - heparin ggt   Acute normocytic anemia; likely due to critical illness, blood loss from chest tube - follow cbc   Severe protein calorie malnutrition -- EN    GOC -- palliative consulted - discussion today with family - I was present with palliative care   Best Practice / Goals of Care / Disposition.   Diet: EN Pain/Anxiety/Delirium protocol (if indicated): n/a VAP protocol (if indicated):n/a DVT prophylaxis: heparin gtt  GI prophylaxis: Protonix Glucose control: monitor CBG Mobility: bed rest Code Status: Full Family Communication: attempted 3/9 without success. Pending 3/10  Disposition: ICU  LABS  Glucose Recent Labs  Lab 08/15/19 1946 08/15/19 2111 08/15/19 2330 08/16/19 0358 08/16/19 0810 08/16/19 1119  GLUCAP 160* 176* 151* 146* 118* 124*    BMET Recent Labs  Lab 08/14/19 0303 08/15/19 0433 08/16/19 0605  NA 143 148* 151*  K 5.4* 5.1 5.5*  CL 105 109 110  CO2 29 30 33*  BUN 46* 44* 46*  CREATININE 1.25* 1.10 0.88  GLUCOSE 152* 150* 139*    Liver Enzymes Recent Labs  Lab 08/11/19 0345 08/12/19 0318 08/13/19 1500  AST 37 60* 25  ALT 34 53* 33  ALKPHOS 78 95 71  BILITOT 0.5 0.7 0.6  ALBUMIN 1.7* 1.8* 1.6*    Electrolytes Recent Labs  Lab 08/10/19 0556 08/10/19 1700 08/11/19 0345 08/11/19 0345 08/11/19 2036 08/12/19 0318 08/12/19 0518 08/14/19 0303 08/15/19 0433 08/16/19 0605  CALCIUM   < >  --  8.8*   < >  --   8.6*   < > 8.6* 8.5* 8.6*  MG   < > 1.9 1.9  --  2.0 2.1  --   --   --   --   PHOS  --  2.6 3.3  --  7.1*  --   --   --   --   --    < > = values in this interval not displayed.    CBC Recent Labs  Lab 08/14/19 0303 08/15/19 0433 08/16/19 0605  WBC 33.3* 38.8* 38.1*  HGB 7.9* 7.8* 7.9*  HCT 24.8* 24.5* 25.9*  PLT 181 191 219    ABG Recent Labs  Lab 08/11/19 1313 08/13/19 0045 08/13/19 1315  PHART 7.185* 7.278* 7.314*  PCO2ART 75.9* 58.0* 58.8*  PO2ART 229.0* 51.0* 104.0    Coag's Recent Labs  Lab 08/11/19 2036  APTT 30  INR 1.5*    Sepsis Markers No results for input(s): LATICACIDVEN, PROCALCITON, O2SATVEN in the last 168 hours.  Cardiac Enzymes No results for input(s): TROPONINI, PROBNP in the last 168 hours.  CRITICAL CARE Performed by: [name] Taryn Nave   This patient is critically ill with multiple organ system failure; which, requires frequent high complexity decision making, assessment, support, evaluation, and titration of therapies. This was completed through the application of advanced monitoring technologies and extensive interpretation of multiple databases. During this encounter critical care time was devoted to patient care services described in this note for 32 minutes.   [facility] 08/16/2019 6:43 PM

## 2019-08-16 NOTE — Progress Notes (Signed)
STROKE TEAM PROGRESS NOTE   INTERVAL HISTORY No family at bedside. Pt still intubated, eyes half way open, not following commands, still using external muscle for breathing on vent, but seems less effort than yesterday. Palliative care on board, discussed with daughters, pt now in DNR status and no escalation of care.   OBJECTIVE Vitals:   08/16/19 0500 08/16/19 0700 08/16/19 0735 08/16/19 0812  BP: (!) 137/96 (!) 115/91 (!) 115/91   Pulse: (!) 106 (!) 112 (!) 107   Resp: (!) 38 (!) 30 (!) 37   Temp:    98.2 F (36.8 C)  TempSrc:    Oral  SpO2: 97% 100% 100%   Weight: 68.1 kg     Height:        CBC:  Recent Labs  Lab 08/11/19 0345 08/11/19 1313 08/12/19 0318 08/13/19 0045 08/15/19 0433 08/16/19 0605  WBC 21.2*   < > 60.5*   < > 38.8* 38.1*  NEUTROABS 17.5*  --  51.3*  --   --   --   HGB 6.9*   < > 9.3*   < > 7.8* 7.9*  HCT 20.5*   < > 29.2*   < > 24.5* 25.9*  MCV 92.8   < > 99.0   < > 97.6 101.2*  PLT 187   < > 201   < > 191 219   < > = values in this interval not displayed.    Basic Metabolic Panel:  Recent Labs  Lab 08/11/19 0345 08/11/19 0345 08/11/19 1313 08/11/19 2036 08/12/19 0318 08/12/19 0518 08/15/19 0433 08/16/19 0605  NA 139   < >   < >  --  142   < > 148* 151*  K 4.6   < >   < >  --  6.7*   < > 5.1 5.5*  CL 107   < >  --   --  108   < > 109 110  CO2 24   < >  --   --  22   < > 30 33*  GLUCOSE 112*   < >  --   --  117*   < > 150* 139*  BUN 33*   < >  --   --  39*   < > 44* 46*  CREATININE 0.76   < >  --   --  1.17   < > 1.10 0.88  CALCIUM 8.8*   < >  --   --  8.6*   < > 8.5* 8.6*  MG 1.9  --   --  2.0 2.1  --   --   --   PHOS 3.3  --   --  7.1*  --   --   --   --    < > = values in this interval not displayed.   Lipid Panel:     Component Value Date/Time   CHOL 90 08/13/2019 0439   TRIG 112 08/13/2019 0439   HDL 32 (L) 08/13/2019 0439   CHOLHDL 2.8 08/13/2019 0439   VLDL 22 08/13/2019 0439   LDLCALC 36 08/13/2019 0439   HgbA1c:  Lab  Results  Component Value Date   HGBA1C 6.0 (H) 08/13/2019   Urine Drug Screen:     Component Value Date/Time   LABOPIA NONE DETECTED 08/11/2019 1328   COCAINSCRNUR NONE DETECTED 08/11/2019 1328   LABBENZ NONE DETECTED 08/11/2019 1328   AMPHETMU NONE DETECTED 08/11/2019 1328   THCU POSITIVE (A) 08/11/2019 1328  LABBARB NONE DETECTED 08/11/2019 1328    Alcohol Level     Component Value Date/Time   ETH <10 07/10/2019 1633    IMAGING past 24h No results found.   PHYSICAL EXAM   Temp:  [98.2 F (36.8 C)-99.2 F (37.3 C)] 98.2 F (36.8 C) (03/11 0812) Pulse Rate:  [80-136] 107 (03/11 0735) Resp:  [21-50] 37 (03/11 0735) BP: (113-147)/(75-96) 115/91 (03/11 0735) SpO2:  [68 %-100 %] 100 % (03/11 0735) FiO2 (%):  [40 %-50 %] 40 % (03/11 0735) Weight:  [68.1 kg] 68.1 kg (03/11 0500)  General - Well nourished, well developed, intubated not on sedation.  Ophthalmologic - fundi not visualized due to small pupils.  Cardiovascular - Regular rhythm, but tachycardia.  Respiratory - triggers vent, but SOB using external muscles, slight improvement from yesterday  Neuro - intubated not on sedation, eyes spontaneous open, not following commands. Eyes able to open bigger on voice but not following any further commands. With eye opening, eyes spontaneous rolling horizontally, not blinking to visual threat, not tracking, b/l pupil 73mm, very sluggish to light. Corneal reflex bilateral very weak, gag and cough nearly diminished. Breathing over the vent.  Facial symmetry not able to test due to ET tube.  Tongue protrusion not cooperative. On pain stimulation, no movement of all extremities. DTR 1+ and no babinski. Sensation, coordination and gait not tested.   ASSESSMENT/PLAN [name] is a 59 y.o. male with history of HTN and tobacco use admitted to [facility] February 17 with intractable hiccups, loss of appetite and diarrhea - complicated hospital course with dyspnea -> abnormal CXR -> resp  failure -> intubation -> bradycardia -> asystole -> CPR -> TPA for possible PE -> hypotension -> AMS -> CT -> large right MCA territory stroke.  He receive IV t-PA for possible pulmonary embolism not for stroke.  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 Cardiac arrest s/p CPR vs. endocarditis  Resultant comatose state with poor neurological exam  CT head - Acute infarction in the right middle cerebral artery territory, with sparing of the basal ganglia. Early brain swelling and low density.   CTA head marked hypoattenuation multiple M2 and distal R MCA branch vessels w/ occlusion multiple Proximal R M2 branches. Calcified plaque intracranial ICAs. Moderate stenosis paraclinoid R ICA.  CTA neck L>R ICA bifurcation plaque. 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 R>L airspace dz lung apices. Mildly displaced fx R 2nd rib.  MRI head - multiple b/l vascular territory infarcts w/ large R MCA infarct   2D Echo 2/18-  EF 60-65%. No source of embolus   BLE Venous Dopplers - Right LE DVT  EEG diffuse encephalopathy, no seizure   2D echo w/ bubble 3/9 possible oscillating density LVOT. EF 55-60%. RA dilated. Neg bubble study.  May consider TCD bubble study or TEE if neuro improvement  2D w/ bubble neg for PFO  [name] - negative  LDL - 36  HgbA1c - 6.0   UDS - positive for THCU  VTE prophylaxis - Heparin IV  No antithrombotic prior to admission, now on heparin IV (briefly on hold 3/8 given anemia requiring transfusion)  Poor neurologic outcome, palliative care on board for GOC discussion  Code status - DNR, no escalation of care   Asystolic cardiac arrest, PEA arrest  S/p CPR  On IV heparin - briefly  off 3/8 due to severe anemia requiring PRBC  Acute Hypoxemic Hypercarbic Respiratory Failure Bilateral pneumonia  R  Hydropneumothorax Leukocytosis and fever  Intubated   Leukocytosis w/ bandemia - 21.2->48.4->60.5->42.3->33.6->33.3->38.8->38.1  Tmax 100.1->101.1->afebrile  ID on board  on unasyn   Off bactrium  s/p CT placement  Right LE DVT  Suspected pulmonary embolus - not proven  BLE Venous Dopplers - Right LE DVT  Received tPA for possible PE  Transitioned from full dose lovenox to IV heparin s/p cardiac arrest  On IV heparin (briefly on hold 3/8 given anemia requiring transfusion)  Septic shock vs Cardiogenic Sepsis Hx Hypertension  Home BP meds: HCTZ ; Norvasc ; Cozaar  Current BP meds: Levophed  off pressor now . Avoid low BP  Anemia   Hb 9.3->7.5->7.0->7.1->6.6-> PRBC ->7.9->7.8->7.9  normocytic s/p CT placement  Close monitoring  Continue on heparin   Hypernatremia   Na 143-148-151  Creatinine downtrending nicely  No sign of dehydration  Concerning for developing of DI  Dysphagia Severe Protein Calorie Malnutrition . NPO . On tube feeds @ 60  Tobacco abuse  Current smoker  Smoking cessation counseling may be provided  Nicotine patch provided  Other Stroke Risk Factors  ETOH use, advised to drink no more than 1 alcoholic beverage per day.  Other Active Problems  Tachycardia - Lopressor  Hyperkalemia - 6.3->4.7->4.8->4.9->5.0->5.1->5.1->5.5  AKI Cre 1.43->1.39->1.25->1.10->0.88  Hospital day # 22   Rosalin Hawking, MD PhD Stroke Neurology 08/16/2019 9:00 AM   To contact Stroke Continuity provider, please refer to [email or web address] After hours, contact General Neurology

## 2019-08-16 NOTE — Progress Notes (Signed)
ANTICOAGULATION CONSULT NOTE  Pharmacy Consult for heparin Indication: DVT , R/o PE No Known Allergies  Patient Measurements: Height: 6\' 1"  (185.4 cm) Weight: 146 lb 9.7 oz (66.5 kg) IBW/kg (Calculated) : 79.9 Heparin Dosing Weight: 64kg  Vital Signs: Temp: 99.2 F (37.3 C) (03/11 0401) Temp Source: Oral (03/11 0401) BP: 140/93 (03/11 0400) Pulse Rate: 107 (03/11 0445)  Labs: Recent Labs    08/13/19 1500 08/13/19 1500 08/14/19 0303 08/14/19 1800 08/15/19 0433 08/15/19 1203 08/15/19 2130  HGB 6.6*   < > 7.9*  --  7.8*  --   --   HCT 21.2*  --  24.8*  --  24.5*  --   --   PLT 191  --  181  --  191  --   --   HEPARINUNFRC  --   --   --    < > 0.27* 0.22* 0.23*  CREATININE 1.39*  --  1.25*  --  1.10  --   --    < > = values in this interval not displayed.    Estimated Creatinine Clearance: 69.7 mL/min (by C-G formula based on SCr of 1.1 mg/dL).   Medical History: Past Medical History:  Diagnosis Date  . Hypertension    Assessment: 59 year old male with newly diagnosed DVT 3/5 placed on full dose lovenox 3/5 now s/p cardiac arrest with tPA administered 3/6. Pharmacy to dose heparin. CT head with acute infarct, per neuro ok to keep IV heparin for now, will utilize no bolus and low goal.  Heparin level is therapeutic at 0.32 after rate increase to 1500 units/hour. H&H is stable today at 7.9/25.9, plts wnl at 219. No bleeding noted.   Goal of Therapy:  Heparin level 0.3-0.5 units/ml Monitor platelets by anticoagulation protocol: Yes   Plan:  -No boluses -Slightly increase heparin to 1550 units/h -6 hour heparin level -Daily heparin level and CBC  -Monitor for signs of bleeding    Thank you,   11-28-1999, PharmD PGY-1 Pharmacy Resident   Please check amion for clinical pharmacist contact number

## 2019-08-17 DIAGNOSIS — I63411 Cerebral infarction due to embolism of right middle cerebral artery: Secondary | ICD-10-CM

## 2019-08-17 DIAGNOSIS — Z515 Encounter for palliative care: Secondary | ICD-10-CM

## 2019-08-17 DIAGNOSIS — Z9689 Presence of other specified functional implants: Secondary | ICD-10-CM

## 2019-08-17 DIAGNOSIS — I469 Cardiac arrest, cause unspecified: Secondary | ICD-10-CM

## 2019-08-17 DIAGNOSIS — J939 Pneumothorax, unspecified: Secondary | ICD-10-CM

## 2019-08-17 LAB — BASIC METABOLIC PANEL
Anion gap: 11 (ref 5–15)
BUN: 45 mg/dL — ABNORMAL HIGH (ref 6–20)
CO2: 34 mmol/L — ABNORMAL HIGH (ref 22–32)
Calcium: 8.8 mg/dL — ABNORMAL LOW (ref 8.9–10.3)
Chloride: 111 mmol/L (ref 98–111)
Creatinine, Ser: 0.89 mg/dL (ref 0.61–1.24)
GFR calc Af Amer: >60 mL/min (ref >60)
GFR calc non Af Amer: >60 mL/min (ref >60)
Glucose, Bld: 139 mg/dL — ABNORMAL HIGH (ref 70–99)
Potassium: 5.3 mmol/L — ABNORMAL HIGH (ref 3.5–5.1)
Sodium: 156 mmol/L — ABNORMAL HIGH (ref 135–145)

## 2019-08-17 LAB — CBC
HCT: 24.5 % — ABNORMAL LOW (ref 39.0–52.0)
Hemoglobin: 7.3 g/dL — ABNORMAL LOW (ref 13.0–17.0)
MCH: 30.7 pg (ref 26.0–34.0)
MCHC: 29.8 g/dL — ABNORMAL LOW (ref 30.0–36.0)
MCV: 102.9 fL — ABNORMAL HIGH (ref 80.0–100.0)
Platelets: 197 10*3/uL (ref 150–400)
RBC: 2.38 MIL/uL — ABNORMAL LOW (ref 4.22–5.81)
RDW: 16 % — ABNORMAL HIGH (ref 11.5–15.5)
WBC: 40.4 10*3/uL — ABNORMAL HIGH (ref 4.0–10.5)
nRBC: 0.2 % (ref 0.0–0.2)

## 2019-08-17 LAB — GLUCOSE, CAPILLARY
Glucose-Capillary: 132 mg/dL — ABNORMAL HIGH (ref 70–99)
Glucose-Capillary: 122 mg/dL — ABNORMAL HIGH (ref 70–99)
Glucose-Capillary: 95 mg/dL (ref 70–99)
Glucose-Capillary: 114 mg/dL — ABNORMAL HIGH (ref 70–99)

## 2019-08-17 LAB — HEPARIN LEVEL (UNFRACTIONATED): Heparin Unfractionated: 0.41 IU/mL (ref 0.30–0.70)

## 2019-08-17 MED ORDER — CHLORHEXIDINE GLUCONATE 0.12 % MT SOLN
OROMUCOSAL | Status: AC
  Filled 2019-08-17: qty 15

## 2019-08-17 MED ORDER — MORPHINE 100MG IN NS 100ML (1MG/ML) PREMIX INFUSION
2.0000 mg/h | INTRAVENOUS | Status: DC
  Administered 2019-08-17: 2 mg/h via INTRAVENOUS
  Filled 2019-08-17: qty 100

## 2019-08-17 MED ORDER — MORPHINE SULFATE (PF) 2 MG/ML IV SOLN
2.0000 mg | INTRAVENOUS | Status: DC | PRN
  Administered 2019-08-17 (×2): 2 mg via INTRAVENOUS
  Filled 2019-08-17 (×2): qty 1

## 2019-08-17 MED ORDER — MORPHINE BOLUS VIA INFUSION
2.0000 mg | INTRAVENOUS | Status: DC | PRN
  Administered 2019-08-17 (×3): 2 mg via INTRAVENOUS
  Filled 2019-08-17: qty 2

## 2019-08-17 MED ORDER — LORAZEPAM 2 MG/ML IJ SOLN
1.0000 mg | INTRAMUSCULAR | Status: DC | PRN
  Administered 2019-08-17: 2 mg via INTRAVENOUS
  Filled 2019-08-17: qty 1

## 2019-08-17 MED ORDER — GLYCOPYRROLATE 0.2 MG/ML IJ SOLN
0.4000 mg | INTRAMUSCULAR | Status: DC
  Administered 2019-08-17 (×3): 0.4 mg via INTRAVENOUS
  Filled 2019-08-17 (×2): qty 2

## 2019-09-06 NOTE — Progress Notes (Addendum)
PULMONARY / CRITICAL CARE MEDICINE   NAME:  Charles Walters, MRN:  [id number], DOB:  December 11, 1960, LOS: 23 ADMISSION DATE:  07/13/2019, CONSULTATION DATE:  07/30/2019 REFERRING MD:  Grandville Silos, CHIEF COMPLAINT:  Hypoxemic respiratory failure  BRIEF HISTORY:    The patient is a 59 year old gentleman who presented 07/11/2019 with hiccups, loss of appetite, hyponatremia, watery diarrhea.  He was also short of breath on exertion.  He had a grossly abnormal chest x-ray and was found to be incidentally hypoxemic.  He was seen initially by pulmonary critical care team and treated empirically for community-acquired pneumonia.  However over the last week he has continued to decline, had worsening hypoxemia, and had a repeat CT chest which shows persistent bilateral lower lobe predominant groundglass opacities with interlobular septal thickening.  Is being called back to evaluate these opacities in the setting of hypoxemic respiratory failure.  SIGNIFICANT PAST MEDICAL HISTORY   Hypertension  SIGNIFICANT EVENTS:  2/22: PCCM consulted and recommended for CAP coverage 3/1: PCCM consulted for persistent infiltrates and hypoxemia, planned for bronch AM of 3/2 for which transferred to ICU; bronch cancelled for worsening O2 requirements  3/6 intubated for bronchoscopy 3/6 cardiac arrest overnight  3/7 chest tube placed for R tension pneumothorax 3/9 weaning   STUDIES:   2/17: CT Chest wo Contrast: multifocal pneumonia, viral vs atypical; mild aneurysmal dilation of ascending aorta up to 4.2cm  2/18: CT Angio Chest: No PE; nonspecific multifocal pneumonia of atypical etiology 3/1: CT Chest wo Contrast: extensive worsening crazy paving pattern (ground glass opacity + interlobular septal thickening) throughout peripheral lungs bilaterally involving all lung lobes; new patchy regions of consolidative airspace disease in lungs bilaterally; most prominent in lower lobes. Given interval worsening, considerations include acute  interstitial pneumonia/ARDS, atypical infection or pulmonary alveolar proteinosis; small dependent bilateral pleural effusions; dilated main pulmonary artery suggesting pulmonary arterial hypertension; stable ascending thoracic aorta aneurysm.  CXR 3/8 improved aeration throughout the right lung, incompletely reexpanded.  Pleural catheter in appropriate position.  Persistent airspace disease bilaterally.  ETT about 2 cm above the carina.  CULTURES:  Cultures no growth to date Respiratory virus panel negative MRSA PCR negative Covid negative 3/3: Blood cultures x2> neg to date 3/3: Urine legionella>  3/3: Urine strep>  3/6: BAL>> E faecalis. 3/6 pneumocystis smear> neg 3/6 BAL AFB >> 3/6 BAL fungus>   ANTIBIOTICS:  Vancomycin 2/22> 3/2 Cefepime 2/25> 3/2 Bactrim 3/2 Azithromycin 3/2 Unasyn 3/7>> anidulafingin 3/7-3/8 Ceftriaxone 3/7-3/8 Bactrim 3/7 - 3/9  CONSULTANTS:  PCCM Neurology ID  SUBJECTIVE:   Remains intubated on mechanical life support.   Family have been working with Palliation services and have decided on terminal extubation  Today 3/12 with transition to comfort care .  T Max 99.6, WBC 40.4 + 5 L  CONSTITUTIONAL: BP 113/84   Pulse (!) 110   Temp 98.2 F (36.8 C) (Oral)   Resp (!) 32   Ht _0  (1.854 m)   Wt 68.2 kg   SpO2 100%   BMI 19.84 kg/m   I/O last 3 completed shifts: In: 1889.7 [I.V.:529.7; NG/GT:960; IV [id number]:711] Out: 6579 [Urine:2400; Chest Tube:250]  Vent Mode: PRVC FiO2 (%):  [40 %] 40 % Set Rate:  [22 bmp] 22 bmp Vt Set:  [480 mL] 480 mL PEEP:  [5 cmH20-8 cmH20] 5 cmH20 Plateau Pressure:  [21 cmH20-29 cmH20] 22 cmH20  CBC Latest Ref Rng & Units 2019/08/24 08/16/2019 08/15/2019  WBC 4.0 - 10.5 K/uL 40.4(H) 38.1(H) 38.8(H)  Hemoglobin 13.0 - 17.0 g/dL  7.3(L) 7.9(L) 7.8(L)  Hematocrit 39.0 - 52.0 % 24.5(L) 25.9(L) 24.5(L)  Platelets 150 - 400 K/uL 197 219 191   CMP Latest Ref Rng & Units 08-23-2019 08/16/2019 08/15/2019  Glucose  70 - 99 mg/dL 139(H) 139(H) 150(H)  BUN 6 - 20 mg/dL 45(H) 46(H) 44(H)  Creatinine 0.61 - 1.24 mg/dL 0.89 0.88 1.10  Sodium 135 - 145 mmol/L 156(H) 151(H) 148(H)  Potassium 3.5 - 5.1 mmol/L 5.3(H) 5.5(H) 5.1  Chloride 98 - 111 mmol/L 111 110 109  CO2 22 - 32 mmol/L 34(H) 33(H) 30  Calcium 8.9 - 10.3 mg/dL 8.8(L) 8.6(L) 8.5(L)  Total Protein 6.5 - 8.1 g/dL - - -  Total Bilirubin 0.3 - 1.2 mg/dL - - -  Alkaline Phos 38 - 126 U/L - - -  AST 15 - 41 U/L - - -  ALT 0 - 44 U/L - - -   PHYSICAL EXAM: General: Chronically ill intubated on mechanical life support , no sedation , unresponsive Neuro: Follows no commands, no purposeful movement, Opens eyes to voice,no focus or track,  Eyes with disconjugate gaze  today with right eye abduction position HEENT: No proximal muscle wasting trachea midline ET tube secure and  in place Cardiovascular: Tachycardic, regular S1-S2, No RMG Lungs: Bilateral chest excursion, Chest tube in place with serosanguinous drainage ,  mechanically ventilated breath sounds, diminished per bases Abdomen: Soft, nontender,  nondistended , Body mass index is 19.84 kg/m. musculoskeletal: Muscle wasting no edema  RESOLVED PROBLEM LIST   ASSESSMENT AND PLAN    Asystolic cardiac arrest, PEA cardiac arrest. -14 min code.  Due to concern for PE from DVT, was given TPA postcode.  Likely has anoxic brain injury On top of bilateral strokes P 3/7/>>-MRI brain >> Acute infarctions involving multiple vascular territories bilaterally including a large right MCA territory infarction. No evidence of hemorrhagic conversion.  no significant mass effect   Acute next hypoxemic hypercarbic respiratory failure  Acute interstitial pneumonia versus alveolar hemorrhage. Differential diagnosis follows that of "crazy paving" Areas of groundglass with interlobular septal thickening. Presumed enterococcus faecalis PNA Right-sided pneumothorax s/p chest tube placement Tachypnea>>  overbreathing vent rate of 22 -wonder if this is neurogenic in nature?  P -Remains on full mechanical support -Mental status precludes liberation from vent -Chest tubes in place with serosang drainage noted Shock, improved   Likely septic versus less likely cardiogenic (LVEF 55 to 60%, mildly enlarged RV & RA-unclear if this is from a PE or acute hypoxic respiratory failure induced hypoxic vasoconstriction). Not currently on sedation. 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  Postcode EEG with profound diffuse encephalopathy, but no seizures or epileptiform discharges.  Early edema on CT head. P -appreciate neuro input , they have signed off as pending withdrawal of life support 3/12   AKI, improved Hyperkalemia, improved Hypernatremia, mild P --follow uop - follow bmp  - continue free water   Leukocytosis w/bandemia: Intermittent fever  -- on unasyn - appreciate ID input  - Trend WBC and fever curve  Acute right lower extremity DVT - heparin ggt   Acute normocytic anemia; likely due to critical illness,  blood loss from chest tube - follow cbc  - Transfuse for HGB < 7  Severe protein calorie malnutrition -- EN    GOC - Family have decided on terminal extubation 3/12 . Daughters and brother  will be present for extubation and transition to comfort care. No labs or diagnostics in am as patient will be comfort care once extubated.  We will need to change Code Status to full  DNR once patient has been extubated  Best Practice / Goals of Care / Disposition.    Diet: EN Pain/Anxiety/Delirium protocol (if indicated): n/a VAP protocol (if indicated):n/a DVT prophylaxis: heparin gtt  GI prophylaxis: Protonix Glucose control: monitor CBG Mobility: bed rest Code Status: Full Family Communication: Family to come for withdrawal of care 3/12 Disposition: ICU  LABS  Glucose Recent Labs  Lab 08/16/19 1119 08/16/19 1548 08/16/19 1941 08/16/19 2338 09-14-2019 0355 2019/09/14 0749  GLUCAP 124* 140* 147* 145* 132* 122*    BMET Recent Labs  Lab 08/15/19 0433 08/16/19 0605 Sep 14, 2019 0556  NA 148* 151* 156*  K 5.1 5.5* 5.3*  CL 109 110 111  CO2 30 33* 34*  BUN 44* 46* 45*  CREATININE 1.10 0.88 0.89  GLUCOSE 150* 139* 139*    Liver Enzymes Recent Labs  Lab 08/11/19 0345 08/12/19 0318 08/13/19 1500  AST 37 60* 25  ALT 34 53* 33  ALKPHOS 78 95 71  BILITOT 0.5 0.7 0.6  ALBUMIN 1.7* 1.8* 1.6*    Electrolytes Recent Labs  Lab 08/10/19 1700 08/10/19 1700 08/11/19 0345 08/11/19 0345 08/11/19 2036 08/12/19 0318 08/12/19 0518 08/15/19 0433 08/16/19 0605 2019/09/14 0556  CALCIUM  --   --  8.8*   < >  --  8.6*   < > 8.5* 8.6* 8.8*  MG 1.9   < > 1.9  --  2.0 2.1  --   --   --   --   PHOS 2.6  --  3.3  --  7.1*  --   --   --   --   --    < > = values in this interval not displayed.    CBC Recent Labs  Lab 08/15/19 0433 08/16/19 0605 09-14-19 0556  WBC 38.8* 38.1* 40.4*  HGB 7.8* 7.9* 7.3*  HCT 24.5* 25.9* 24.5*  PLT 191 219 197    ABG Recent Labs  Lab 08/11/19 1313 08/13/19 0045 08/13/19 1315  PHART 7.185* 7.278* 7.314*  PCO2ART 75.9* 58.0* 58.8*  PO2ART 229.0* 51.0* 104.0    Coag's Recent Labs  Lab 08/11/19 2036  APTT 30  INR 1.5*    Sepsis Markers No results for input(s): LATICACIDVEN, PROCALCITON, O2SATVEN in the last 168 hours.  Cardiac Enzymes No results for input(s): TROPONINI, PROBNP in the last 168 hours.  CRITICAL CARE APP Time: 20 minutes   Magdalen Spatz, MSN, AGACNP-BC [facility]  Pulmonary/Critical Care Medicine Pager # [phone] After 4 pm please call [phone] 09/14/19 11:34 AM     PCCM:  59 yo, prolong hospital course, complicated by PEA cardiac arrest, AHRF, enterococcal fae PNA, bilateral stroke, anoxic brain injury.  Family has met with palliative care. Decisions have been made for comfort care measures.   Please see documentation by palliative care team.  I have called and spoke with Dr. Micheline Rough.   [place] [facility] September 14, 2019 5:36 PM

## 2019-09-06 NOTE — Progress Notes (Signed)
   08/06/2019 1450  Clinical Encounter Type  Visited With Health care provider  Visit Type Follow-up  Referral From Palliative care team  Consult/Referral To Chaplain   Chaplain responded to consult for family support. RN said family has not arrived yet. RN will page [employer] when family is present and ready to transition to comfort care. Chaplains remain available for support as needs arise.   Chaplain Resident, Amado Coe, M Div [phone] on-call pager

## 2019-09-06 NOTE — Progress Notes (Signed)
I responded to consult for pastoral care. No family present at this time. I offered ministry of presence aond silent prayer. Chaplain available as needed.   Chaplain Resident Orest Dikes  [phone]

## 2019-09-06 NOTE — Progress Notes (Signed)
The chaplain responded to a referral from the unit chaplain for support for family as care for the patient is withdrawn. The family indicated that they desired for some time alone with their loved one. The chaplain is available if support is needed.  Lavone Neri Chaplain Resident For questions concerning this note please contact me by pager [phone]

## 2019-09-06 NOTE — Progress Notes (Signed)
Nutrition Brief Note  Chart reviewed. DNR with no escalation of care Noted plans for terminal wean to comfort care later today.  No further nutrition interventions warranted at this time.   Please re-consult as needed.   Romelle Starcher MS, RDN, LDN, CNSC RD Pager Number and Weekend/On-Call After Hours Pager Located in [place]

## 2019-09-06 NOTE — Progress Notes (Signed)
Palliative:  HPI: 59 y.o. male  admitted on 07/14/2019 with hiccups, loss of appetite, hyponatremia, and diarrhea.  Reported shortness of breath on exertion.  Initial chest xray grossly abnormal, initially treated by critical care team empirically for community-acquired pneumonia. Continued to decline with worsening hypoxemia, repeat chest CT showed persistent bilateral lower lobe predominant groundglass opacities with interlobular septal thickening. Patient was intubated for bronchoscopy and subsequently had a PEA cardiac arrest.  He also has suffered a large MCA stroke, with multiple anterior posterior infarcts. Plans for transition to comfort care today.   I met today at Charles Walters bedside x 2. Plans for one way extubation to comfort this afternoon when family arrive to bedside (timing has changed to ~5pm). I worked with Therapist, sports to get Charles Walters more comfortable prior to extubation and plan for comfort medication orders to ensure comfort.   I spoke with my colleague, Charles Walters, who confirmed plan with family for one way extubation to comfort. I called and spoke with daughter Charles Walters who confirms same and discussed addition of morphine infusion for comfort in preparation for extubation. Charles Walters agrees for comfort but wants to ensure nothing is done that could hasten his death prior to her arrive - I assured her this is just for comfort for preparation of extubation. She is currently getting ready to come to the hospital. Orders adjusted for comfort medications. Please call [facility] for extubation orders when family present and ready for extubation. Emotional support provided. I notified chaplain to request presence for family ~5 pm.   Exam: Thin, frail, severe muscle wasting. Tolerating vent. Opens eyes but not following commands. Tachycardic. Tachypneic and breathing appears labored even on vent.   Plan: - One way extubation to full comfort care when family arrives and ready.  - Discussed plans and  recommendations with RN Melynn.   [place], NP Palliative Medicine Team Pager [phone] (Please see [email or web address] for schedule) Team Phone [phone]    Greater than 50%  of this time was spent counseling and coordinating care related to the above assessment and plan

## 2019-09-06 NOTE — Death Summary Note (Signed)
DEATH SUMMARY   Patient Details  Name: Charles Walters MRN: [id number] DOB: Feb 03, 1961  Admission/Discharge Information   Admit Date:  07-30-19  Date of Death: Date of Death: 08-22-2019  Time of Death: Time of Death: 1850  Length of Stay: 10/03/2022  Referring Physician: [name], NP   Reason(s) for Hospitalization  The patient is a 59 year old gentleman who presented 07-30-19 with hiccups, loss of appetite, hyponatremia, watery diarrhea.  He was also short of breath on exertion.  He had a grossly abnormal chest x-ray and was found to be incidentally hypoxemic.  He was seen initially by pulmonary critical care team and treated empirically for community-acquired pneumonia.  However over the last week he has continued to decline, had worsening hypoxemia, and had a repeat CT chest which shows persistent bilateral lower lobe predominant groundglass opacities with interlobular septal thickening.  Is being called back to evaluate these opacities in the setting of hypoxemic respiratory failure.  Diagnoses  Preliminary cause of death: Stroke [facility]) Secondary Diagnoses (including complications and co-morbidities):  Principal Problem:   Acute respiratory failure with hypoxia (HCC) Active Problems:   Hyponatremia   Intractable hiccups   Diarrhea   Asthenia   Essential hypertension   Community acquired bacterial pneumonia   Acute interstitial pneumonia (HCC)   Leukocytosis   Sinus tachycardia   Tobacco abuse   Severe protein-calorie malnutrition (HCC)   Acute deep vein thrombosis (DVT) of right lower extremity (HCC)   Anemia   Cerebral embolism with cerebral infarction   Spontaneous tension pneumothorax   Endotracheally intubated   On mechanically assisted ventilation (HCC)   DNR (do not resuscitate)   Cardiac arrest (HCC)   Chest tube in place   Pneumothorax   Terminal care   Palliative care encounter   Brief Hospital Course (including significant findings, care, treatment, and  services provided and events leading to death)  Charles Walters is a 59 y.o. year old male who with hiccups, loss of appetite, hyponatremia, watery diarrhea. He was also short of breath on exertion. He had a grossly abnormal chest x-ray and was found to be incidentally hypoxemic. He was seen initially by pulmonary critical care team and treated empirically for community-acquired pneumonia. However over the last week he has continued to decline, had worsening hypoxemia, and had a repeat CT chest which shows persistent bilateral lower lobe predominant groundglass opacities with interlobular septal thickening. Is being called back to evaluate these opacities in the setting of hypoxemic respiratory failure.  Patient's hospital course was complicated by progressive respiratory failure.  Patient had ongoing bilateral infiltrates.  This was ultimately led to patient's transfer to the intensive care unit.  I spent several days in the ICU ultimately progressive respiratory failure requiring intubation and bronchoscopy on 08/11/2019. Overnight on 08/11/2018 developed cardiac arrest.  Patient was revived following cardiac arrest and further investigation patient was determined to have a new acute large MCA stroke.  MRI eventually confirming bilateral stroke.  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  At that point they feel as if  the  patient would no longer want to maintain life connected to mechanical support.  The decision was made for palliative withdrawal of care and transition to comfort care measures.  Patient passed peacefully in the intensive care unit with family at bedside.  Pertinent Labs and Studies  Significant Diagnostic Studies CT ANGIO HEAD W OR WO CONTRAST  Addendum Date: 08/12/2019   ADDENDUM REPORT: 08/12/2019 14:57 ADDENDUM: These results were called by telephone at the time of interpretation on 08/12/2019 at 2:17 pm to provider PRAMOD SETHI , who verbally acknowledged these results. Electronically Signed   By: Kellie Simmering DO   On: 08/12/2019 14:57   Result Date: 08/12/2019 CLINICAL DATA:  Stroke, follow-up. EXAM: CT ANGIOGRAPHY HEAD AND NECK TECHNIQUE: Multidetector CT imaging of the head and neck was performed using the standard protocol during bolus administration of intravenous contrast. Multiplanar CT image reconstructions and MIPs were obtained to evaluate the vascular anatomy. Carotid stenosis measurements (when applicable) are obtained utilizing NASCET criteria, using the distal internal carotid diameter as the denominator. CONTRAST:  3mL OMNIPAQUE IOHEXOL 350 MG/ML SOLN COMPARISON:  Noncontrast CT head 08/12/2019 FINDINGS: CT HEAD FINDINGS Brain: Continued interval demarcation of abnormal cortical/subcortical hypodensity throughout much of the right MCA vascular territory consistent with acute infarction. There is most notable involvement of the right frontal and parietal lobes with the relative sparing of the right temporal lobe and right basal ganglia. No evidence of hemorrhagic conversion. Mild mass effect with effacement of right-sided cerebral sulci. No significant effacement of the ventricular system. No midline shift. Redemonstrated chronic small-vessel ischemic changes within the pons. Ill-defined hypoattenuation within the cerebral white matter is nonspecific, but consistent with chronic small vessel  ischemic disease. Chronic small vessel ischemic changes again demonstrated within the pons. Redemonstrated age-indeterminate infarcts in the bilateral cerebellar hemispheres. Unchanged mild generalized parenchymal atrophy. Vascular: Reported separately. Skull: Normal. Negative for fracture or focal lesion. Sinuses: Left maxillary sinus mucous retention cyst. Small right mastoid effusion. Orbits: Visualized orbits demonstrate no acute abnormality. Review of the MIP images confirms the above findings CTA NECK FINDINGS Mildly motion degraded examination. Aortic arch: Standard aortic branching. The visualized aortic arch is unremarkable. No significant innominate or proximal subclavian artery stenosis. Right carotid system: CCA and ICA patent within the neck without stenosis. Minimal calcified plaque within the carotid bifurcation and proximal ICA. Left carotid system: CCA and ICA patent within the neck without significant stenosis (50% or greater). Mild mixed plaque within the carotid bifurcation and proximal ICA. Vertebral arteries: The right vertebral artery is dominant and patent throughout the neck without significant stenosis. The non dominant left vertebral artery is developmentally diminutive but patent throughout the neck. Skeleton: Acute fracture of the anterolateral right second rib. Other neck: No neck mass or cervical lymphadenopathy. Upper chest: Extensive airspace disease within the partially imaged right lung apex. Partially imaged, there is a probable hydropneumothorax. Ill-defined ground-glass opacity is also present within the imaged left upper lobe. ET tube terminates above the level of the carina. Partially visualized enteric tube. Review of the MIP images confirms the above findings CTA HEAD FINDINGS Anterior circulation: The intracranial right internal carotid artery is patent with scattered calcified plaque. There is up to moderate stenosis within the paraclinoid segment. Otherwise no more than  mild narrowing of this vessel. The intracranial left internal carotid artery is patent with scattered calcified plaque. No more than mild stenosis within this vessel. The M1 middle cerebral arteries are patent without significant stenosis. Marked attenuation of M2 and more  distal right MCA branches with occlusion of multiple proximal right M2 branches. No left M2 proximal branch occlusion or high-grade proximal stenosis is identified. The anterior cerebral arteries are patent without high-grade proximal stenosis. No intracranial aneurysm is identified. Posterior circulation: The intracranial vertebral arteries are patent. Calcified plaque within the V4 right vertebral artery with mild-to-moderate stenosis. The basilar artery is patent without significant stenosis. The posterior cerebral arteries are patent bilaterally without high-grade proximal stenosis. Sizable posterior communicating arteries are present bilaterally. Venous sinuses: Within limitations of contrast timing, no convincing thrombus. Anatomic variants: None significant Review of the MIP images confirms the above findings IMPRESSION: CT head: 1. Continued interval demarcation of acute ischemic infarction changes throughout much of the right MCA vascular territory. No evidence of hemorrhagic conversion. No midline shift. 2. Age indeterminate infarcts in the bilateral cerebellar hemispheres. 3. Chronic ischemic changes within the cerebral white matter and pons. CTA neck: 1. The bilateral common carotid, internal carotid and vertebral arteries are patent within the neck without significant stenosis (50% or greater). Plaque within the left greater than right carotid bifurcations and proximal ICAs. 2. Partially visualized probable right-sided hydropneumothorax. Consider dedicated chest CT for confirmation. 3. Airspace disease within the right greater than left imaged lung apices. Findings may reflect aspiration/pneumonia or contusion. 4. Mildly displaced  fracture of the anterolateral right second rib. CTA head: 1. Marked attenuation of multiple M2 and more distal right MCA branch vessels with occlusion of multiple proximal right M2 MCA branches. 2. No intracranial large vessel occlusion or proximal high-grade arterial stenosis identified elsewhere. 3. Calcified plaque within the intracranial internal carotid arteries. Up to moderate stenosis within the paraclinoid right ICA. Electronically Signed: By: Jackey Loge DO On: 08/12/2019 14:10   DG Chest 1 View  Result Date: 08/04/2019 CLINICAL DATA:  Dyspnea. EXAM: CHEST  1 VIEW COMPARISON:  08/02/2019 FINDINGS: 0957 hours. The diffuse hazy ground-glass and consolidative airspace opacity noted bilaterally with mid and lower lung predominance is similar to prior study. No substantial pleural effusion. Cardiopericardial silhouette is at upper limits of normal for size. The visualized bony structures of the thorax are intact. Telemetry leads overlie the chest. IMPRESSION: No substantial change in the bilateral multifocal airspace disease. Electronically Signed   By: Kennith Center M.D.   On: 08/04/2019 10:22   DG Chest 1 View  Result Date: 08/02/2019 CLINICAL DATA:  Shortness of breath.  Pneumonia. EXAM: CHEST  1 VIEW COMPARISON:  July 30, 2019 FINDINGS: There is airspace opacity throughout much of the right mid and lower lung zones, essentially stable. There is consolidation in the left base with ill-defined airspace opacity in the left mid lung and portions of the left base. Heart size and pulmonary vascular normal. No adenopathy. No bone lesions. IMPRESSION: Multifocal airspace disease, essentially stable on the right and increased in the left mid lung. Consolidation in the left base is perhaps minimally increased compared to recent study. Stable cardiac silhouette.  No adenopathy. Electronically Signed   By: Bretta Bang III M.D.   On: 08/02/2019 12:55   CT HEAD WO CONTRAST  Result Date:  08/12/2019 CLINICAL DATA:  Encephalopathy following cardiopulmonary arrest. EXAM: CT HEAD WITHOUT CONTRAST TECHNIQUE: Contiguous axial images were obtained from the base of the skull through the vertex without intravenous contrast. COMPARISON:  None. FINDINGS: Brain: Chronic appearing small-vessel changes affect the pons and cerebellum. There is loss of gray-white differentiation and mild diffuse low-density and swelling within the right middle cerebral artery cortical distribution consistent with acute  infarction. There appears to be sparing of the basal ganglia. No evidence of hemorrhage or mass effect. Chronic small-vessel ischemic changes present elsewhere within the hemispheric white matter. No acute left hemisphere insult. Vascular: There is atherosclerotic calcification of the major vessels at the base of the brain. Skull: Negative Sinuses/Orbits: Clear except for a few small retention cysts. Orbits negative. Other: None IMPRESSION: Acute infarction in the right middle cerebral artery territory, with sparing of the basal ganglia. Early brain swelling and low density. No evidence of hemorrhage or mass effect at this time. Background pattern of chronic small vessel ischemic changes elsewhere throughout the brain. Electronically Signed   By: Paulina Fusi M.D.   On: 08/12/2019 01:38   CT ANGIO NECK W OR WO CONTRAST  Addendum Date: 08/12/2019   ADDENDUM REPORT: 08/12/2019 14:57 ADDENDUM: These results were called by telephone at the time of interpretation on 08/12/2019 at 2:17 pm to provider PRAMOD SETHI , who verbally acknowledged these results. Electronically Signed   By: Jackey Loge DO   On: 08/12/2019 14:57   Result Date: 08/12/2019 CLINICAL DATA:  Stroke, follow-up. EXAM: CT ANGIOGRAPHY HEAD AND NECK TECHNIQUE: Multidetector CT imaging of the head and neck was performed using the standard protocol during bolus administration of intravenous contrast. Multiplanar CT image reconstructions and MIPs were  obtained to evaluate the vascular anatomy. Carotid stenosis measurements (when applicable) are obtained utilizing NASCET criteria, using the distal internal carotid diameter as the denominator. CONTRAST:  50mL OMNIPAQUE IOHEXOL 350 MG/ML SOLN COMPARISON:  Noncontrast CT head 08/12/2019 FINDINGS: CT HEAD FINDINGS Brain: Continued interval demarcation of abnormal cortical/subcortical hypodensity throughout much of the right MCA vascular territory consistent with acute infarction. There is most notable involvement of the right frontal and parietal lobes with the relative sparing of the right temporal lobe and right basal ganglia. No evidence of hemorrhagic conversion. Mild mass effect with effacement of right-sided cerebral sulci. No significant effacement of the ventricular system. No midline shift. Redemonstrated chronic small-vessel ischemic changes within the pons. Ill-defined hypoattenuation within the cerebral white matter is nonspecific, but consistent with chronic small vessel ischemic disease. Chronic small vessel ischemic changes again demonstrated within the pons. Redemonstrated age-indeterminate infarcts in the bilateral cerebellar hemispheres. Unchanged mild generalized parenchymal atrophy. Vascular: Reported separately. Skull: Normal. Negative for fracture or focal lesion. Sinuses: Left maxillary sinus mucous retention cyst. Small right mastoid effusion. Orbits: Visualized orbits demonstrate no acute abnormality. Review of the MIP images confirms the above findings CTA NECK FINDINGS Mildly motion degraded examination. Aortic arch: Standard aortic branching. The visualized aortic arch is unremarkable. No significant innominate or proximal subclavian artery stenosis. Right carotid system: CCA and ICA patent within the neck without stenosis. Minimal calcified plaque within the carotid bifurcation and proximal ICA. Left carotid system: CCA and ICA patent within the neck without significant stenosis (50% or  greater). Mild mixed plaque within the carotid bifurcation and proximal ICA. Vertebral arteries: The right vertebral artery is dominant and patent throughout the neck without significant stenosis. The non dominant left vertebral artery is developmentally diminutive but patent throughout the neck. Skeleton: Acute fracture of the anterolateral right second rib. Other neck: No neck mass or cervical lymphadenopathy. Upper chest: Extensive airspace disease within the partially imaged right lung apex. Partially imaged, there is a probable hydropneumothorax. Ill-defined ground-glass opacity is also present within the imaged left upper lobe. ET tube terminates above the level of the carina. Partially visualized enteric tube. Review of the MIP images confirms the above findings CTA  HEAD FINDINGS Anterior circulation: The intracranial right internal carotid artery is patent with scattered calcified plaque. There is up to moderate stenosis within the paraclinoid segment. Otherwise no more than mild narrowing of this vessel. The intracranial left internal carotid artery is patent with scattered calcified plaque. No more than mild stenosis within this vessel. The M1 middle cerebral arteries are patent without significant stenosis. Marked attenuation of M2 and more distal right MCA branches with occlusion of multiple proximal right M2 branches. No left M2 proximal branch occlusion or high-grade proximal stenosis is identified. The anterior cerebral arteries are patent without high-grade proximal stenosis. No intracranial aneurysm is identified. Posterior circulation: The intracranial vertebral arteries are patent. Calcified plaque within the V4 right vertebral artery with mild-to-moderate stenosis. The basilar artery is patent without significant stenosis. The posterior cerebral arteries are patent bilaterally without high-grade proximal stenosis. Sizable posterior communicating arteries are present bilaterally. Venous sinuses:  Within limitations of contrast timing, no convincing thrombus. Anatomic variants: None significant Review of the MIP images confirms the above findings IMPRESSION: CT head: 1. Continued interval demarcation of acute ischemic infarction changes throughout much of the right MCA vascular territory. No evidence of hemorrhagic conversion. No midline shift. 2. Age indeterminate infarcts in the bilateral cerebellar hemispheres. 3. Chronic ischemic changes within the cerebral white matter and pons. CTA neck: 1. The bilateral common carotid, internal carotid and vertebral arteries are patent within the neck without significant stenosis (50% or greater). Plaque within the left greater than right carotid bifurcations and proximal ICAs. 2. Partially visualized probable right-sided hydropneumothorax. Consider dedicated chest CT for confirmation. 3. Airspace disease within the right greater than left imaged lung apices. Findings may reflect aspiration/pneumonia or contusion. 4. Mildly displaced fracture of the anterolateral right second rib. CTA head: 1. Marked attenuation of multiple M2 and more distal right MCA branch vessels with occlusion of multiple proximal right M2 MCA branches. 2. No intracranial large vessel occlusion or proximal high-grade arterial stenosis identified elsewhere. 3. Calcified plaque within the intracranial internal carotid arteries. Up to moderate stenosis within the paraclinoid right ICA. Electronically Signed: By: Jackey Loge DO On: 08/12/2019 14:10   CT CHEST WO CONTRAST  Result Date: 08/06/2019 CLINICAL DATA:  Inpatient. Follow-up worsening community-acquired pneumonia with hypoxemic respiratory fat. EXAM: CT CHEST WITHOUT CONTRAST TECHNIQUE: Multidetector CT imaging of the chest was performed following the standard protocol without IV contrast. COMPARISON:  07/26/2019 chest CT angiogram. 08/04/2019 chest radiograph. FINDINGS: Cardiovascular: Top-normal heart size. No significant pericardial  effusion/thickening. Three-vessel coronary atherosclerosis. Atherosclerotic thoracic aorta with stable 4.4 cm ectatic ascending thoracic aorta. Dilated main pulmonary artery (3.5 cm diameter). Mediastinum/Nodes: No discrete thyroid nodules. Unremarkable esophagus. No pathologically enlarged axillary, mediastinal or hilar lymph nodes, noting limited sensitivity for the detection of hilar adenopathy on this noncontrast study. Lungs/Pleura: No pneumothorax. Small dependent bilateral pleural effusions. There is extensive worsening crazy paving pattern (ground-glass opacity + interlobular septal thickening) opacity throughout the peripheral lungs bilaterally involving all lung lobes. New patchy regions of consolidative airspace disease are present in lungs bilaterally, most prominent in the lower lobes. No discrete lung masses. No central airway stenoses. Upper abdomen: No acute abnormality. Musculoskeletal: No aggressive appearing focal osseous lesions. Moderate thoracic spondylosis. IMPRESSION: 1. Extensive worsening crazy paving pattern (ground-glass opacity + interlobular septal thickening) throughout the peripheral lungs bilaterally involving all lung lobes. New patchy regions of consolidative airspace disease in the lungs bilaterally, most prominent in the lower lobes. Given the interval worsening, considerations include acute interstitial pneumonia (Hamman-Rich  syndrome)/ARDS, atypical infection or pulmonary alveolar proteinosis. Pulmonology consultation suggested. 2. Small dependent bilateral pleural effusions. 3. Dilated main pulmonary artery, suggesting pulmonary arterial hypertension. 4. 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 Ascending thoracic aortic aneurysm. Recommend semi-annual imaging followup by CTA or MRA and referral to cardiothoracic surgery if not already obtained. This recommendation follows 2010 ACCF/AHA/AATS/ACR/ASA/SCA/SCAI/SIR/STS/SVM Guidelines for the Diagnosis and Management of  Patients With Thoracic Aortic Disease. Circulation. 2010; 121: O962-X528. Aortic aneurysm NOS (ICD10-I71.9). 5. Three-vessel coronary atherosclerosis. 6. Aortic Atherosclerosis (ICD10-I70.0). Electronically Signed   By: Delbert Phenix M.D.   On: 08/06/2019 09:37   MR BRAIN WO CONTRAST  Result Date: 08/12/2019 CLINICAL DATA:  Stroke, follow-up EXAM: MRI HEAD WITHOUT CONTRAST TECHNIQUE: Multiplanar, multiecho pulse sequences of the brain and surrounding structures were obtained without intravenous contrast. COMPARISON:  Correlation made with recent CT imaging FINDINGS: Brain: There is extensive restricted diffusion within the right cerebral hemisphere involving MCA and PCA territories. Additional multiple scattered small foci contralaterally involving the frontoparietal greater than occipital lobes. There also multiple small foci of restricted diffusion within the cerebellar hemispheres and a small focus within the parasagittal right midbrain. There is no significant mass effect at this time. Focus of susceptibility within the left cerebellar hemisphere is compatible with chronic microhemorrhage. There is no intracranial mass, mass effect, or edema. There is no hydrocephalus or extra-axial fluid collection. Vascular: Major vessel flow voids at the skull base are preserved. Skull and upper cervical spine: Normal marrow signal is preserved. Sinuses/Orbits: Mild mucosal thickening.  Orbits are unremarkable. Other: Sella is unremarkable. Nonspecific mastoid fluid opacification. IMPRESSION: Acute infarctions involving multiple vascular territories bilaterally including a large right MCA territory infarction. No evidence of hemorrhagic conversion. There is no significant mass effect at this time. Electronically Signed   By: Guadlupe Spanish M.D.   On: 08/12/2019 13:59   DG CHEST PORT 1 VIEW  Result Date: 08/15/2019 CLINICAL DATA:  Respiratory failure. EXAM: PORTABLE CHEST 1 VIEW COMPARISON:  08/13/2019 FINDINGS: The  endotracheal tube is 4.5 cm above the carina. The NG tube is stable. The left PICC line is stable. Persistent diffuse interstitial and airspace process in the lungs and stable right-sided chest tube. No definite pneumothorax. IMPRESSION: 1. Stable support apparatus. 2. Persistent diffuse interstitial and airspace process. Electronically Signed   By: Rudie Meyer M.D.   On: 08/15/2019 09:22   DG CHEST PORT 1 VIEW  Result Date: 08/13/2019 CLINICAL DATA:  Hypoxia. EXAM: PORTABLE CHEST 1 VIEW COMPARISON:  Radiograph of same day. FINDINGS: Stable cardiomegaly. Endotracheal and nasogastric tubes are in grossly good position. Left-sided PICC line is unchanged. Stable position of right-sided chest tube is noted. Stable small right lateral pneumothorax is noted stable right lung opacities are noted concerning for pneumonia. Bony thorax is unremarkable. IMPRESSION: Stable support apparatus. Stable small right lateral pneumothorax. Stable right lung opacities concerning for pneumonia. Electronically Signed   By: Lupita Raider M.D.   On: 08/13/2019 13:54   DG CHEST PORT 1 VIEW  Result Date: 08/13/2019 CLINICAL DATA:  60 year old male status post chest tube placement. EXAM: PORTABLE CHEST 1 VIEW COMPARISON:  0057 hours today. FINDINGS: Portable AP semi upright view at 0153 hours. A right chest tube is in place with mild to moderate right chest wall subcutaneous gas. Regressed right pneumothorax and resolved leftward mediastinal shift. Small volume right pneumothorax is possible. Stable enteric tube, endotracheal tube, left PICC line. Coarse and patchy bilateral pulmonary opacity is stable. Stable contrast in nondistended splenic flexure. IMPRESSION:  1. Substantially improved status post right chest tube placement. Resolved mediastinal shift and small if any residual right pneumothorax. 2. Stable underlying bilateral pulmonary opacities. And otherwise stable lines and tubes. Electronically Signed   By: Odessa Fleming M.D.   On:  08/13/2019 02:15   DG Chest Port 1 View  Addendum Date: 08/13/2019   ADDENDUM REPORT: 08/13/2019 01:26 ADDENDUM: Critical Value/emergent results were called by telephone at the time of interpretation on 08/13/2019 at 0124 hours to RN Sheri who advises that the patient's physician and RN are currently placing a chest tube. Electronically Signed   By: Odessa Fleming M.D.   On: 08/13/2019 01:26   Result Date: 08/13/2019 CLINICAL DATA:  59 year old male with respiratory distress, hypoxia, pneumonia/ARDS. Recent cardiac arrest. EXAM: PORTABLE CHEST 1 VIEW COMPARISON:  Noncontrast chest CT 08/06/2019. Portable chest 08/12/2019 and earlier. FINDINGS: Portable AP semi upright view at 0057 hours. New large right pneumothorax with some leftward shift of the mediastinum. The patient remains intubated with endotracheal tube tip between the level the clavicles and carina. Collapse of the right lung medially. There is a nondisplaced fracture of the posterior right 10th rib now apparent. Left lung ventilation appears stable with coarse and confluent reticulonodular opacity. Stable left PICC line. Enteric tube position stable. Stable mediastinal contours. IMPRESSION: 1. New tension right pneumothorax in this intubated patient. 2. Stable left lung infection.  Stable lines and tubes. 3. Nondisplaced right 10th rib fracture incidentally noted. Electronically Signed: By: Odessa Fleming M.D. On: 08/13/2019 01:19   DG CHEST PORT 1 VIEW  Result Date: 08/12/2019 CLINICAL DATA:  59 year old male on ventilation. EXAM: PORTABLE CHEST 1 VIEW COMPARISON:  Chest radiograph dated 08/11/2019. FINDINGS: There has been interval placement of a left-sided PICC with tip over central SVC. Endotracheal tube remains above the carina. Enteric tube extends below the diaphragm with tip in the proximal stomach. Bilateral airspace opacities relatively similar to prior radiograph. There is a cavitary lesion or a bulla in the right mid lung field as seen previously. No  large pleural effusion no definite pneumothorax. Stable cardiomediastinal silhouette. No acute osseous pathology. IMPRESSION: Interval placement of a left-sided PICC with tip over central SVC. Otherwise no significant interval change in the appearance of the lungs. Continued follow-up recommended. Electronically Signed   By: Elgie Collard M.D.   On: 08/12/2019 17:59   DG CHEST PORT 1 VIEW  Result Date: 08/11/2019 CLINICAL DATA:  59 year old male with cardiac arrest and concern for pneumothorax. EXAM: PORTABLE CHEST 1 VIEW COMPARISON:  Chest radiograph dated 08/11/2019. FINDINGS: Endotracheal and enteric tube in similar position. Bilateral airspace opacities as seen previously. No pneumothorax. Stable cardiomediastinal silhouette. No acute osseous pathology. IMPRESSION: No interval change.  No pneumothorax. Electronically Signed   By: Elgie Collard M.D.   On: 08/11/2019 18:28   DG CHEST PORT 1 VIEW  Result Date: 08/11/2019 CLINICAL DATA:  Intubated. EXAM: PORTABLE CHEST 1 VIEW COMPARISON:  08/09/2019 FINDINGS: Interval endotracheal tube in satisfactory position. Interval nasogastric tube with its tip and side hole in the proximal stomach. Interval mildly enlarged cardiac silhouette. Mild increase in patchy opacity and prominence of the interstitial markings in both lungs. No pleural fluid seen. Lower thoracic spine degenerative changes. IMPRESSION: 1. Mildly progressive bilateral pneumonia. 2. Interval mild cardiomegaly. 3. Endotracheal tube and nasogastric tube in satisfactory position. Electronically Signed   By: Beckie Salts M.D.   On: 08/11/2019 12:37   DG Chest Port 1 View  Result Date: 08/09/2019 CLINICAL DATA:  Shortness of  breath, respiratory failure, smoker EXAM: PORTABLE CHEST 1 VIEW COMPARISON:  Portable exam 0534 hours compared to 08/08/2019 FINDINGS: Normal heart size mediastinal contours. Hazy infiltrates again identified throughout both lungs, predominantly mid lungs, slightly greater on  RIGHT than LEFT, consistent with pneumonia. No pleural effusion or pneumothorax. Bones unremarkable. IMPRESSION: Persistent pulmonary infiltrates consistent with multifocal pneumonia. Electronically Signed   By: Ulyses SouthwardMark  Boles M.D.   On: 08/09/2019 09:12   DG CHEST PORT 1 VIEW  Result Date: 08/08/2019 CLINICAL DATA:  Dyspnea, hypoxia EXAM: PORTABLE CHEST 1 VIEW COMPARISON:  Chest radiograph from one day prior. FINDINGS: Stable cardiomediastinal silhouette with normal heart size. No pneumothorax. No pleural effusion. Extensive patchy hazy opacity throughout both lungs, most prominent in right mid lung, not substantially changed. IMPRESSION: Stable chest radiograph with extensive patchy hazy opacity throughout both lungs, most prominent in the right mid lung. Electronically Signed   By: Delbert PhenixJason A Poff M.D.   On: 08/08/2019 09:55   DG CHEST PORT 1 VIEW  Result Date: 08/23/2019 CLINICAL DATA:  Hypoxia, history of pneumonia EXAM: PORTABLE CHEST 1 VIEW COMPARISON:  None. FINDINGS: The heart size and mediastinal contours are unchanged with mild cardiomegaly. There is no significant interval change in the bilateral patchy and reticulonodular airspace opacities. No pleural effusion. No acute osseous abnormality. IMPRESSION: Stable examination. Multifocal patchy airspace opacities throughout both lungs. Electronically Signed   By: Jonna ClarkBindu  Avutu M.D.   On: 06/30/2019 06:51   DG Swallowing Func-Speech Pathology  Result Date: 08/06/2019 Objective Swallowing Evaluation: Type of Study: MBS-Modified Barium Swallow Study  Patient Details Name: Kandice MoosJohn Mailhot MRN: [id number] Date of Birth: May 19, 1961 Today's Date: 09/02/2019 Time: SLP Start Time (ACUTE ONLY): 1513 -SLP Stop Time (ACUTE ONLY): 1530 SLP Time Calculation (min) (ACUTE ONLY): 17 min Past Medical History: Past Medical History: Diagnosis Date . Hypertension  Past Surgical History: No past surgical history on file. HPI: Pt is a 59 y.o. male with past medical history  significant for HTN who presented to the ED for treatment of hiccups. Patient states that he was in his usual state of reasonable health until 1 week prior to admission when he developed intractable hiccups as well as loss of appetite, watery diarrhea and some dyspnea on exertion. Chest x-ray of 3/4: Persistent pulmonary infiltrates consistent with multifocal pneumonia.Pt reported "a little bit" of coughing/choking when he eats.  No data recorded Assessment / Plan / Recommendation CHL IP CLINICAL IMPRESSIONS 08/10/2019 Clinical Impression Pt was seen in radiology suite for modified barium swallow study. Trials of puree solids, dysphagia 3 solids, thin liquids,  nectar thick liquids, and a 13mm barium tablet were administered. Pt presents with mild-moderate oropharyngeal dysphagia characterized by impaired bolus control a pharyngeal delay, reduced lingual retraction, and reduced anterior laryngeal movement. He demonstrated premature spillage to the valleculae and pyriform sinuses, mild vallecular residue, mild-moderate pyriform sinus residue, and inconsistent silent aspiration (PAS 8) of thin liquids from residue in the pyriform sinuses. No instances of penetration/aspiration were noted with any other solids or liquids. It is noteworthy that his swallow function is likely negatively impacted by his respiratory function. Pt reported dyspnea during the study and that mastication of solids was effortful. Breaks were provided and pt reported improved respiratory function. A dysphagia 2 diet with nectar thick liquids is recommended at this time. SLP will follow pt to assess diet tolerance and for treatment.  SLP Visit Diagnosis Dysphagia, oropharyngeal phase (R13.12) Attention and concentration deficit following -- Frontal lobe and executive function deficit following -- Impact  on safety and function Mild aspiration risk   CHL IP TREATMENT RECOMMENDATION 08/10/2019 Treatment Recommendations Therapy as outlined in treatment  plan below   Prognosis 08/10/2019 Prognosis for Safe Diet Advancement Good Barriers to Reach Goals Time post onset Barriers/Prognosis Comment -- CHL IP DIET RECOMMENDATION 08/10/2019 SLP Diet Recommendations Nectar thick liquid;Dysphagia 2 (Fine chop) solids Liquid Administration via Cup;No straw Medication Administration Whole meds with puree Compensations Slow rate;Small sips/bites Postural Changes Seated upright at 90 degrees   CHL IP OTHER RECOMMENDATIONS 08/10/2019 Recommended Consults -- Oral Care Recommendations Oral care BID Other Recommendations Order thickener from pharmacy   CHL IP FOLLOW UP RECOMMENDATIONS 08/10/2019 Follow up Recommendations Other (comment)   CHL IP FREQUENCY AND DURATION 08/10/2019 Speech Therapy Frequency (ACUTE ONLY) min 2x/week Treatment Duration 2 weeks      CHL IP ORAL PHASE 08/10/2019 Oral Phase Impaired Oral - Pudding Teaspoon -- Oral - Pudding Cup -- Oral - Honey Teaspoon -- Oral - Honey Cup -- Oral - Nectar Teaspoon -- Oral - Nectar Cup Premature spillage Oral - Nectar Straw Premature spillage Oral - Thin Teaspoon -- Oral - Thin Cup Premature spillage Oral - Thin Straw Premature spillage Oral - Puree -- Oral - Mech Soft -- Oral - Regular -- Oral - Multi-Consistency -- Oral - Pill (No Data) Oral Phase - Comment --  CHL IP PHARYNGEAL PHASE 08/10/2019 Pharyngeal Phase Impaired Pharyngeal- Pudding Teaspoon -- Pharyngeal -- Pharyngeal- Pudding Cup -- Pharyngeal -- Pharyngeal- Honey Teaspoon -- Pharyngeal -- Pharyngeal- Honey Cup -- Pharyngeal -- Pharyngeal- Nectar Teaspoon -- Pharyngeal -- Pharyngeal- Nectar Cup Reduced anterior laryngeal mobility;Delayed swallow initiation-vallecula;Pharyngeal residue - pyriform;Pharyngeal residue - valleculae Pharyngeal -- Pharyngeal- Nectar Straw Reduced anterior laryngeal mobility;Delayed swallow initiation-vallecula;Pharyngeal residue - pyriform;Pharyngeal residue - valleculae Pharyngeal -- Pharyngeal- Thin Teaspoon -- Pharyngeal -- Pharyngeal- Thin Cup  Reduced anterior laryngeal mobility;Delayed swallow initiation-vallecula;Pharyngeal residue - pyriform;Pharyngeal residue - valleculae;Penetration/Apiration after swallow Pharyngeal Material enters airway, passes BELOW cords without attempt by patient to eject out (silent aspiration) Pharyngeal- Thin Straw Reduced anterior laryngeal mobility;Delayed swallow initiation-vallecula;Pharyngeal residue - pyriform;Pharyngeal residue - valleculae;Penetration/Apiration after swallow Pharyngeal Material enters airway, passes BELOW cords without attempt by patient to eject out (silent aspiration) Pharyngeal- Puree Reduced anterior laryngeal mobility;Delayed swallow initiation-vallecula;Pharyngeal residue - valleculae;Pharyngeal residue - pyriform Pharyngeal -- Pharyngeal- Mechanical Soft Reduced anterior laryngeal mobility;Delayed swallow initiation-vallecula;Pharyngeal residue - pyriform;Pharyngeal residue - valleculae Pharyngeal -- Pharyngeal- Regular -- Pharyngeal -- Pharyngeal- Multi-consistency -- Pharyngeal -- Pharyngeal- Pill -- Pharyngeal -- Pharyngeal Comment --  CHL IP CERVICAL ESOPHAGEAL PHASE 08/10/2019 Cervical Esophageal Phase WFL Pudding Teaspoon -- Pudding Cup -- Honey Teaspoon -- Honey Cup -- Nectar Teaspoon -- Nectar Cup -- Nectar Straw -- Thin Teaspoon -- Thin Cup -- Thin Straw -- Puree -- Mechanical Soft -- Regular -- Multi-consistency -- Pill -- Cervical Esophageal Comment -- Shanika I. Vear Clock, MS, CCC-SLP Acute Rehabilitation Services Office number [phone] Pager [phone] Scheryl Marten 08-Sep-2019, 8:08 AM              EEG adult  Result Date: 08/12/2019 Charlsie Quest, MD     08/12/2019  6:42 PM Patient Name: Josephanthony Tindel MRN: [id number] Epilepsy Attending: Charlsie Quest Referring Physician/Provider: Dr Delia Heady Date: 08/12/2019 Duration: 29.04 mins Patient history: 59yo M admitted for respiratory distress, had cardiorespiratory arrest. EEG to evaluate for seizure. Level of alertness:  comatose AEDs during EEG study: None Technical aspects: This EEG study was done with scalp electrodes positioned according to the 10-20 International system of electrode placement. Lobbyist  activity was acquired at a sampling rate of 500Hz  and reviewed with a high frequency filter of 70Hz  and a low frequency filter of 1Hz . EEG data were recorded continuously and digitally stored. DESCRIPTION:  EEG showed continuous generalized low amplitude 2-3hz  delta slowing. EEG was not reactive to noxious stimulation. Hyperventilation and photic stimulation were not performed. ABNORMALITY - Continuous slow, generalized IMPRESSION: This study is suggestive of profound diffuse encephalopathy, non specific to etiology. No seizures or epileptiform discharges were seen throughout the recording. Priyanka Priyanka   ECHOCARDIOGRAM LIMITED BUBBLE STUDY  Result Date: 08/13/2019    ECHOCARDIOGRAM LIMITED REPORT   Patient Name:   CONNELL BOGNAR Date of Exam: 08/13/2019 Medical Rec #:  10/13/2019   Height:       73.0 in Accession #:    Kandice Moos  Weight:       144.3 lb Date of Birth:  05-29-1961   BSA:          1.873 m Patient Age:    58 years    BP:           99/68 mmHg Patient Gender: M           HR:           116 bpm. Exam Location:  Inpatient Procedure: Limited Color Doppler and Saline Contrast Bubble Study Indications:    Stroke I63.9; New stroke, RLE DVT, r/o PFO. Also post arrest,                 evaluate EF.  History:        Patient has prior history of Echocardiogram examinations, most                 recent 07/26/2019. Risk Factors:Hypertension.  Sonographer:    [id number] RDCS (AE) Referring Phys: 11/04/1961  L  IMPRESSIONS  1. Limited study; doppler not performed; normal LV systolic function; probable mild RAE and RVE; negative saline microcavitation study; possible oscillating density in LVOT; suggest TEE to further assess.  2. Left ventricular ejection fraction, by estimation, is 55 to 60%. The  left ventricle has normal function. The left ventricle has no regional wall motion abnormalities.  3. The right ventricular size is mildly enlarged.  4. Right atrial size was mildly dilated.  5. Agitated saline contrast bubble study was negative, with no evidence of any interatrial shunt. FINDINGS  Left Ventricle: Left ventricular ejection fraction, by estimation, is 55 to 60%. The left ventricle has normal function. The left ventricle has no regional wall motion abnormalities. The left ventricular internal cavity size was normal in size. Right Ventricle: The right ventricular size is mildly enlarged. Left Atrium: Left atrial size was normal in size. Right Atrium: Right atrial size was mildly dilated. Pericardium: Trivial pericardial effusion is present. IAS/Shunts: Agitated saline contrast was given intravenously to evaluate for intracardiac shunting. Agitated saline contrast bubble study was negative, with no evidence of any interatrial shunt. Additional Comments: Limited study; doppler not performed; normal LV systolic function; probable mild RAE and RVE; negative saline microcavitation study; possible oscillating density in LVOT; suggest TEE to further assess. 07/28/2019 MD Electronically signed by Thurman Coyer MD Signature Date/Time: 08/13/2019/12:06:41 PM    Final    VAS Olga Millers LOWER EXTREMITY VENOUS (DVT)  Result Date: 08/11/2019  Lower Venous DVTStudy Indications: Swelling.  Comparison Study: No prior study Performing Technologist: 10/13/2019 MHA, RDMS, RVT, RDCS  Examination Guidelines: A complete evaluation includes B-mode imaging, spectral Doppler, color Doppler,  and power Doppler as needed of all accessible portions of each vessel. Bilateral testing is considered an integral part of a complete examination. Limited examinations for reoccurring indications may be performed as noted. The reflux portion of the exam is performed with the patient in reverse Trendelenburg.   +---------+---------------+---------+-----------+----------+--------------+ RIGHT    CompressibilityPhasicitySpontaneityPropertiesThrombus Aging +---------+---------------+---------+-----------+----------+--------------+ CFV      Full           Yes      Yes                                 +---------+---------------+---------+-----------+----------+--------------+ SFJ      Full                                                        +---------+---------------+---------+-----------+----------+--------------+ FV Prox  Full                                                        +---------+---------------+---------+-----------+----------+--------------+ FV Mid   Full                                                        +---------+---------------+---------+-----------+----------+--------------+ FV DistalFull                                                        +---------+---------------+---------+-----------+----------+--------------+ PFV      Full                                                        +---------+---------------+---------+-----------+----------+--------------+ POP      None                                                        +---------+---------------+---------+-----------+----------+--------------+ PTV      None                    No                   Acute          +---------+---------------+---------+-----------+----------+--------------+ PERO     None                    No                   Acute          +---------+---------------+---------+-----------+----------+--------------+ Gastroc  None  No                   Acute          +---------+---------------+---------+-----------+----------+--------------+   +----+---------------+---------+-----------+----------+--------------+ LEFTCompressibilityPhasicitySpontaneityPropertiesThrombus Aging  +----+---------------+---------+-----------+----------+--------------+ CFV Full           Yes      Yes                                 +----+---------------+---------+-----------+----------+--------------+     Summary: RIGHT: - Findings consistent with acute deep vein thrombosis involving the right popliteal vein, right posterior tibial veins, right peroneal veins, and right gastrocnemius veins. - No cystic structure found in the popliteal fossa.  LEFT: - No evidence of common femoral vein obstruction.  *See table(s) above for measurements and observations. Electronically signed by Sherald Hess MD on 08/11/2019 at 1:47:42 PM.    Final    [place] EKG SITE RITE  Result Date: 08/11/2019 If Site Rite image not attached, placement could not be confirmed due to current cardiac rhythm.   Microbiology No results found for this or any previous visit (from the past 240 hour(s)).  Lab Basic Metabolic Panel: No results for input(s): NA, K, CL, CO2, GLUCOSE, BUN, CREATININE, CALCIUM, MG, PHOS in the last 168 hours. Liver Function Tests: No results for input(s): AST, ALT, ALKPHOS, BILITOT, PROT, ALBUMIN in the last 168 hours. No results for input(s): LIPASE, AMYLASE in the last 168 hours. No results for input(s): AMMONIA in the last 168 hours. CBC: No results for input(s): WBC, NEUTROABS, HGB, HCT, MCV, PLT in the last 168 hours. Cardiac Enzymes: No results for input(s): CKTOTAL, CKMB, CKMBINDEX, TROPONINI in the last 168 hours. Sepsis Labs: No results for input(s): PROCALCITON, WBC, LATICACIDVEN in the last 168 hours.  Procedures/Operations  Bronchoscopy, intubation    L  08/29/2019, 5:30 PM

## 2019-09-06 NOTE — Progress Notes (Signed)
ANTICOAGULATION CONSULT NOTE  Pharmacy Consult for heparin Indication: DVT , R/o PE No Known Allergies  Patient Measurements: Height: 6\' 1"  (185.4 cm) Weight: 150 lb 5.7 oz (68.2 kg) IBW/kg (Calculated) : 79.9 Heparin Dosing Weight: 64kg  Vital Signs: Temp: 98.2 F (36.8 C) (03/12 0357) Temp Source: Oral (03/12 0357) BP: 105/84 (03/12 1100) Pulse Rate: 128 (03/12 1100)  Labs: Recent Labs    08/15/19 0433 08/15/19 1203 08/16/19 0605 08/16/19 1520 08/11/2019 0556  HGB 7.8*  --  7.9*  --  7.3*  HCT 24.5*  --  25.9*  --  24.5*  PLT 191  --  219  --  197  HEPARINUNFRC 0.27*   < > 0.32 0.42 0.41  CREATININE 1.10  --  0.88  --  0.89   < > = values in this interval not displayed.    Estimated Creatinine Clearance: 88.3 mL/min (by C-G formula based on SCr of 0.89 mg/dL).  Assessment: 59 year old male with newly diagnosed DVT 3/5 placed on full dose lovenox 3/5 now s/p cardiac arrest with tPA administered 3/6. CT head with acute infarct, per neuro ok to keep IV heparin for now, will utilize no bolus and low goal.  Heparin level is therapeutic. Plan for terminal wean this afternoon.   59 PharmD., BCPS Clinical Pharmacist 08/18/2019 11:52 AM

## 2019-09-06 NOTE — Progress Notes (Signed)
STROKE TEAM PROGRESS NOTE   INTERVAL HISTORY No family at bedside. Pt still intubated, on vent, tachypnea at 30s but less external muscle involvement as yesterday. Still open eyes on voice but not following commands, not moving all extremities. Eyes disconjugate today with right eye abduction position. Of note, family decided on terminal wean this afternoon.    OBJECTIVE Vitals:   09/01/2019 0615 08/15/2019 0700 08/30/2019 0742 08/10/2019 0800  BP:  120/81 120/81 113/84  Pulse: 98 (!) 107 (!) 101 (!) 110  Resp: (!) 37 (!) 36 (!) 38 (!) 32  Temp:      TempSrc:      SpO2: 100% 100% 100% 100%  Weight:      Height:        CBC:  Recent Labs  Lab 08/11/19 0345 08/11/19 1313 08/12/19 0318 08/13/19 0045 08/16/19 0605 08/30/2019 0556  WBC 21.2*   < > 60.5*   < > 38.1* 40.4*  NEUTROABS 17.5*  --  51.3*  --   --   --   HGB 6.9*   < > 9.3*   < > 7.9* 7.3*  HCT 20.5*   < > 29.2*   < > 25.9* 24.5*  MCV 92.8   < > 99.0   < > 101.2* 102.9*  PLT 187   < > 201   < > 219 197   < > = values in this interval not displayed.    Basic Metabolic Panel:  Recent Labs  Lab 08/11/19 0345 08/11/19 0345 08/11/19 1313 08/11/19 2036 08/12/19 0318 08/12/19 0518 08/16/19 0605 08/28/2019 0556  NA 139   < >   < >  --  142   < > 151* 156*  K 4.6   < >   < >  --  6.7*   < > 5.5* 5.3*  CL 107   < >  --   --  108   < > 110 111  CO2 24   < >  --   --  22   < > 33* 34*  GLUCOSE 112*   < >  --   --  117*   < > 139* 139*  BUN 33*   < >  --   --  39*   < > 46* 45*  CREATININE 0.76   < >  --   --  1.17   < > 0.88 0.89  CALCIUM 8.8*   < >  --   --  8.6*   < > 8.6* 8.8*  MG 1.9  --   --  2.0 2.1  --   --   --   PHOS 3.3  --   --  7.1*  --   --   --   --    < > = values in this interval not displayed.   Lipid Panel:     Component Value Date/Time   CHOL 90 08/13/2019 0439   TRIG 112 08/13/2019 0439   HDL 32 (L) 08/13/2019 0439   CHOLHDL 2.8 08/13/2019 0439   VLDL 22 08/13/2019 0439   LDLCALC 36 08/13/2019 0439    HgbA1c:  Lab Results  Component Value Date   HGBA1C 6.0 (H) 08/13/2019   Urine Drug Screen:     Component Value Date/Time   LABOPIA NONE DETECTED 08/11/2019 1328   COCAINSCRNUR NONE DETECTED 08/11/2019 1328   LABBENZ NONE DETECTED 08/11/2019 1328   AMPHETMU NONE DETECTED 08/11/2019 1328   THCU POSITIVE (A) 08/11/2019 1328  LABBARB NONE DETECTED 08/11/2019 1328    Alcohol Level     Component Value Date/Time   ETH <10 07/27/2019 1633    IMAGING past 24h No results found.   PHYSICAL EXAM    Temp:  [97.9 F (36.6 C)-99.6 F (37.6 C)] 98.2 F (36.8 C) (03/12 0357) Pulse Rate:  [80-120] 110 (03/12 0800) Resp:  [16-40] 32 (03/12 0800) BP: (106-132)/(74-91) 113/84 (03/12 0800) SpO2:  [95 %-100 %] 100 % (03/12 0800) FiO2 (%):  [40 %] 40 % (03/12 0742) Weight:  [68.2 kg] 68.2 kg (03/12 0500)  General - Well nourished, well developed, intubated not on sedation.  Ophthalmologic - fundi not visualized due to small pupils.  Cardiovascular - Regular rhythm, but tachycardia 140s.  Respiratory - still tachypnea, but less involving external muscles today  Neuro - intubated not on sedation, eyes slightly open, not following commands. Eyes able to open bigger on voice but not following any further commands. With eye opening, eyes disconjugate with right eye abduction, not blinking to visual threat, not tracking, b/l pupil 1.15mm, very sluggish to light. Corneal reflex bilateral very weak, gag and cough nearly diminished. Breathing over the vent, tachypnea.  Facial symmetry not able to test due to ET tube.  Tongue protrusion not cooperative. On pain stimulation, no movement of all extremities. DTR 1+ and no babinski. Sensation, coordination and gait not tested.   ASSESSMENT/Walters [name] is a 59 y.o. male with history of HTN and tobacco use admitted to [facility] February 17 with intractable hiccups, loss of appetite and diarrhea - complicated hospital course with dyspnea -> abnormal  CXR -> resp failure -> intubation -> bradycardia -> asystole -> CPR -> TPA for possible PE -> hypotension -> AMS -> CT -> large right MCA territory stroke.  He receive IV t-PA for possible pulmonary embolism not for stroke.  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 Cardiac arrest s/p CPR vs. endocarditis  Resultant comatose state with poor neurological exam  CT head - Acute infarction in the right middle cerebral artery territory, with sparing of the basal ganglia. Early brain swelling and low density.   CTA head marked hypoattenuation multiple M2 and distal R MCA branch vessels w/ occlusion multiple Proximal R M2 branches. Calcified plaque intracranial ICAs. Moderate stenosis paraclinoid R ICA.  CTA neck L>R ICA bifurcation plaque. 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 R>L airspace dz lung apices. Mildly displaced fx R 2nd rib.  MRI head - multiple b/l vascular territory infarcts w/ large R MCA infarct   2D Echo 2/18-  EF 60-65%. No source of embolus   BLE Venous Dopplers - Right LE DVT  EEG diffuse encephalopathy, no seizure   2D echo w/ bubble 3/9 possible oscillating density LVOT. EF 55-60%. RA dilated. Neg bubble study.  2D w/ bubble neg for PFO  [name] - negative  LDL - 36  HgbA1c - 6.0   UDS - positive for THCU  VTE prophylaxis - Heparin IV  No antithrombotic prior to admission, now on heparin IV (briefly on hold 3/8 given anemia requiring transfusion)  Poor neurologic outcome  Code status - DNR, no escalation of care   Plans for terminal wean to comfort care today as per family request  Asystolic cardiac arrest, PEA arrest  S/p CPR  On IV heparin - briefly off 3/8 due to  severe anemia requiring PRBC  Acute Hypoxemic Hypercarbic Respiratory Failure Bilateral pneumonia  R Hydropneumothorax Leukocytosis and  fever  Intubated   Leukocytosis w/ bandemia - 21.2->48.4->60.5->42.3->33.6->33.3->38.8->38.1->40.4  Tmax 100.1->101.1->afebrile  ID on board  on unasyn   Off bactrium  s/p CT placement  Right LE DVT  Suspected pulmonary embolus - not proven  BLE Venous Dopplers - Right LE DVT  Received tPA for possible PE  Transitioned from full dose lovenox to IV heparin s/p cardiac arrest  On IV heparin (briefly on hold 3/8 given anemia requiring transfusion)  Septic shock vs Cardiogenic Sepsis Hx Hypertension  Home BP meds: HCTZ ; Norvasc ; Cozaar  Current BP meds: Levophed  off pressor now . Avoid low BP  Anemia   Hb 9.3->7.5->7.0->7.1->6.6-> PRBC ->7.9->7.8->7.9->7.3  normocytic s/p CT placement  Close monitoring  Continue on heparin   Hypernatremia   Na 143-148-151->156  Creatinine downtrending nicely  Concerning for developing of DI  Dysphagia Severe Protein Calorie Malnutrition . NPO . On tube feeds @ 60  Tobacco abuse  Current smoker  Nicotine patch provided  Other Stroke Risk Factors  ETOH use, advised to drink no more than 1 alcoholic beverage per day.  Other Active Problems  Tachycardia - Lopressor  Hyperkalemia - 6.3->4.7->4.8->4.9->5.0->5.1->5.1->5.5->5.3  AKI Cre 1.43->1.39->1.25->1.10->0.88->0.89  Hospital day # 23  Neurology will sign off. Please call with questions. Thanks for the consult.  Charles Plan, MD PhD Stroke Neurology 08/20/2019 8:40 AM   To contact Stroke Continuity provider, please refer to [email or web address]. After hours, contact General Neurology

## 2019-09-06 NOTE — Progress Notes (Signed)
Janifer Gieselman RN and Clinical biochemist ausculted no heart and lung sounds at 1850. Pt expired at 1850. Pt asystole. Family at bedside.

## 2019-09-06 NOTE — Procedures (Signed)
Extubation Procedure Note  Patient Details:   Name: Charles Walters DOB: 1960/11/16 MRN: [id number]   Airway Documentation:    Vent end date: 08-27-2019 Vent end time: 1825   Evaluation  O2 sats: stable throughout Complications: No apparent complications Patient did tolerate procedure well. Bilateral Breath Sounds: Rhonchi, Diminished   No, pt could not speak post extubation.  Pt extubated per physician's order and family's wishes.  Family at beside during extubation.  Audrie Lia 27-Aug-2019, 6:31 PM

## 2019-09-06 DEATH — deceased

## 2019-09-10 ENCOUNTER — Inpatient Hospital Stay: Payer: Self-pay | Admitting: Pulmonary Disease

## 2019-09-11 LAB — FUNGUS CULTURE RESULT

## 2019-09-11 LAB — FUNGUS CULTURE WITH STAIN

## 2019-09-11 LAB — FUNGAL ORGANISM REFLEX

## 2019-09-26 LAB — ACID FAST CULTURE WITH REFLEXED SENSITIVITIES (MYCOBACTERIA)
Acid Fast Culture: NEGATIVE
Acid Fast Culture: NEGATIVE

## 2021-04-18 IMAGING — DX DG CHEST 1V
2 series · 2 of 2 positions shown · non-contrast
Comparison: 08/02/2019

CLINICAL DATA: Dyspnea.

EXAM:
CHEST  1 VIEW

[chest ap (1 of 2)]
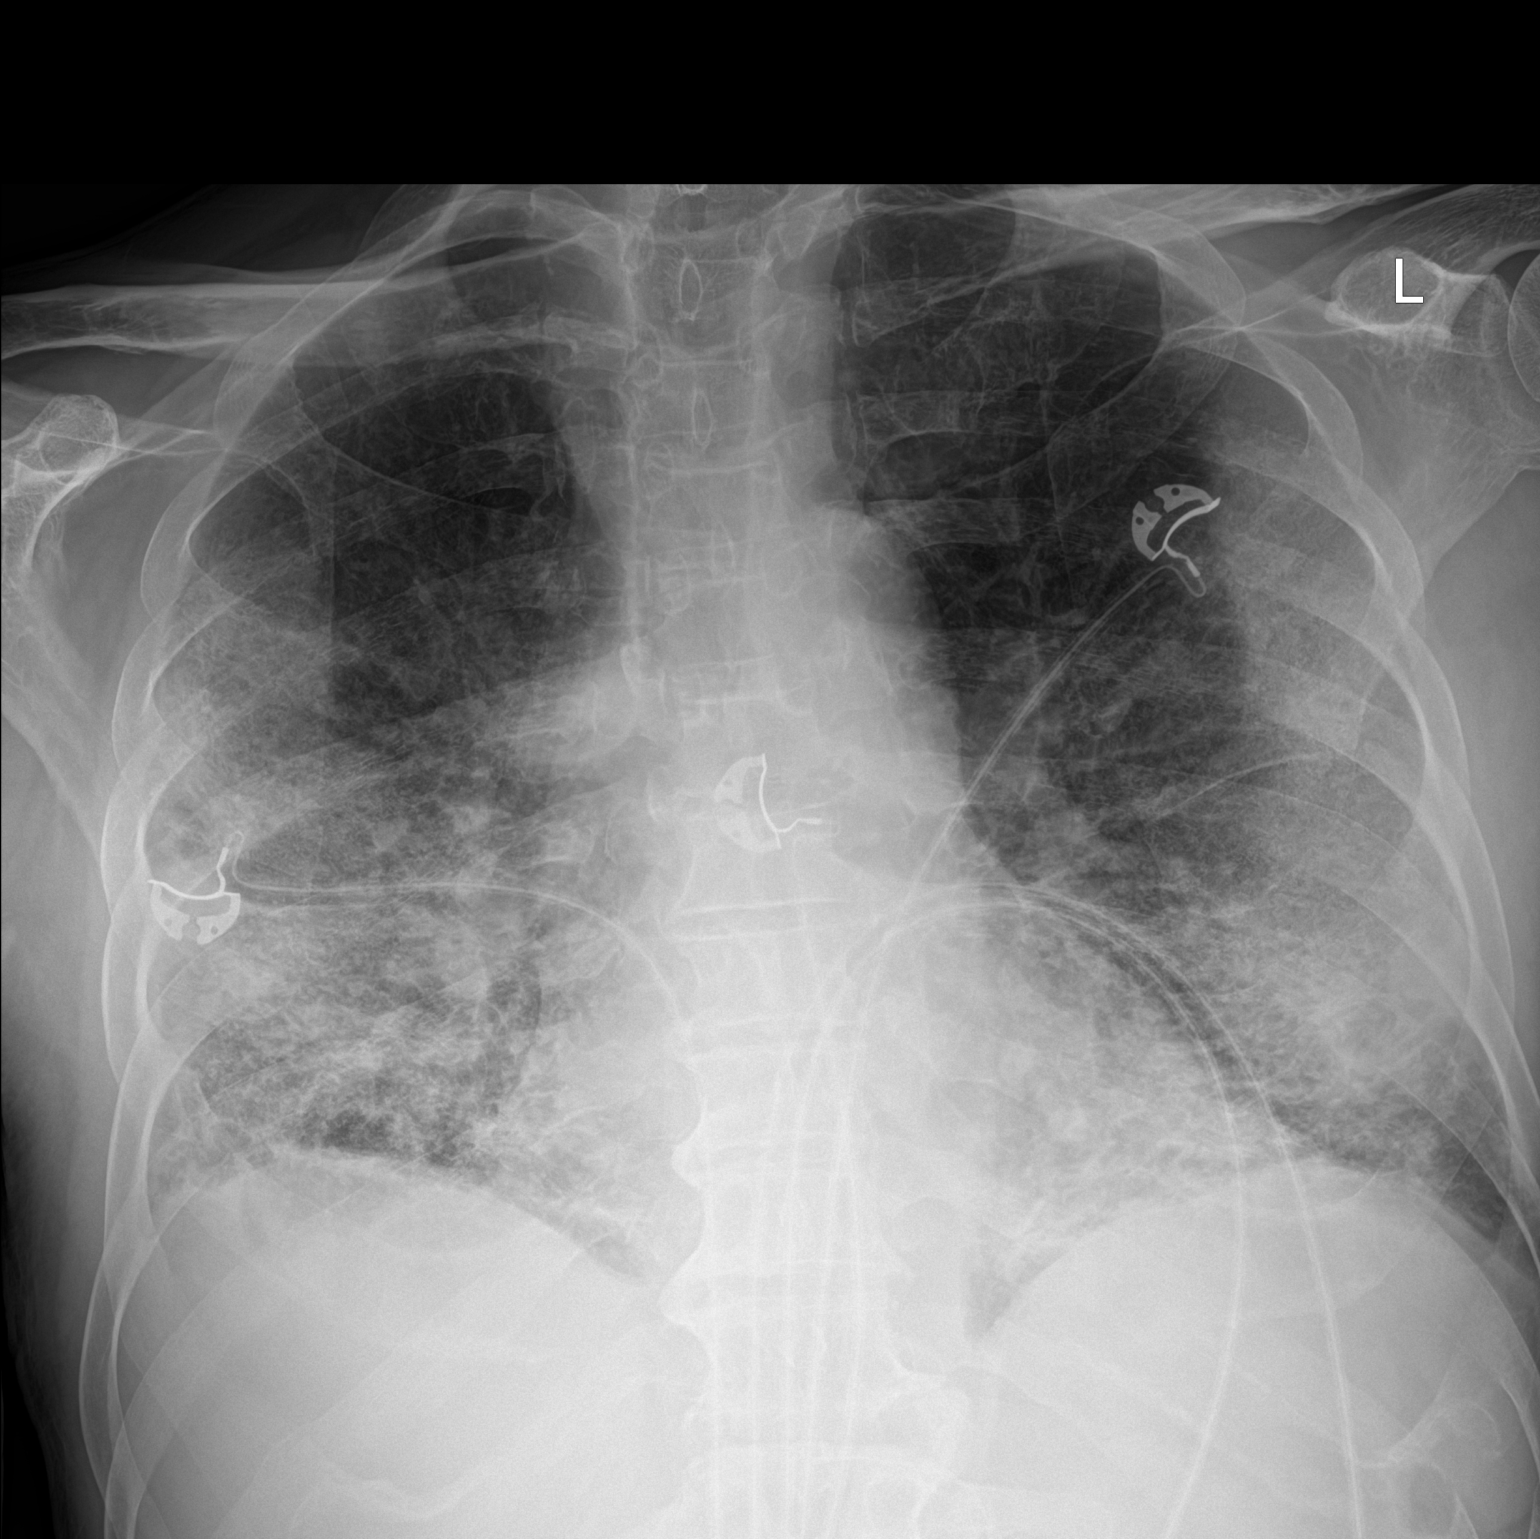

[chest ap (2 of 2)]
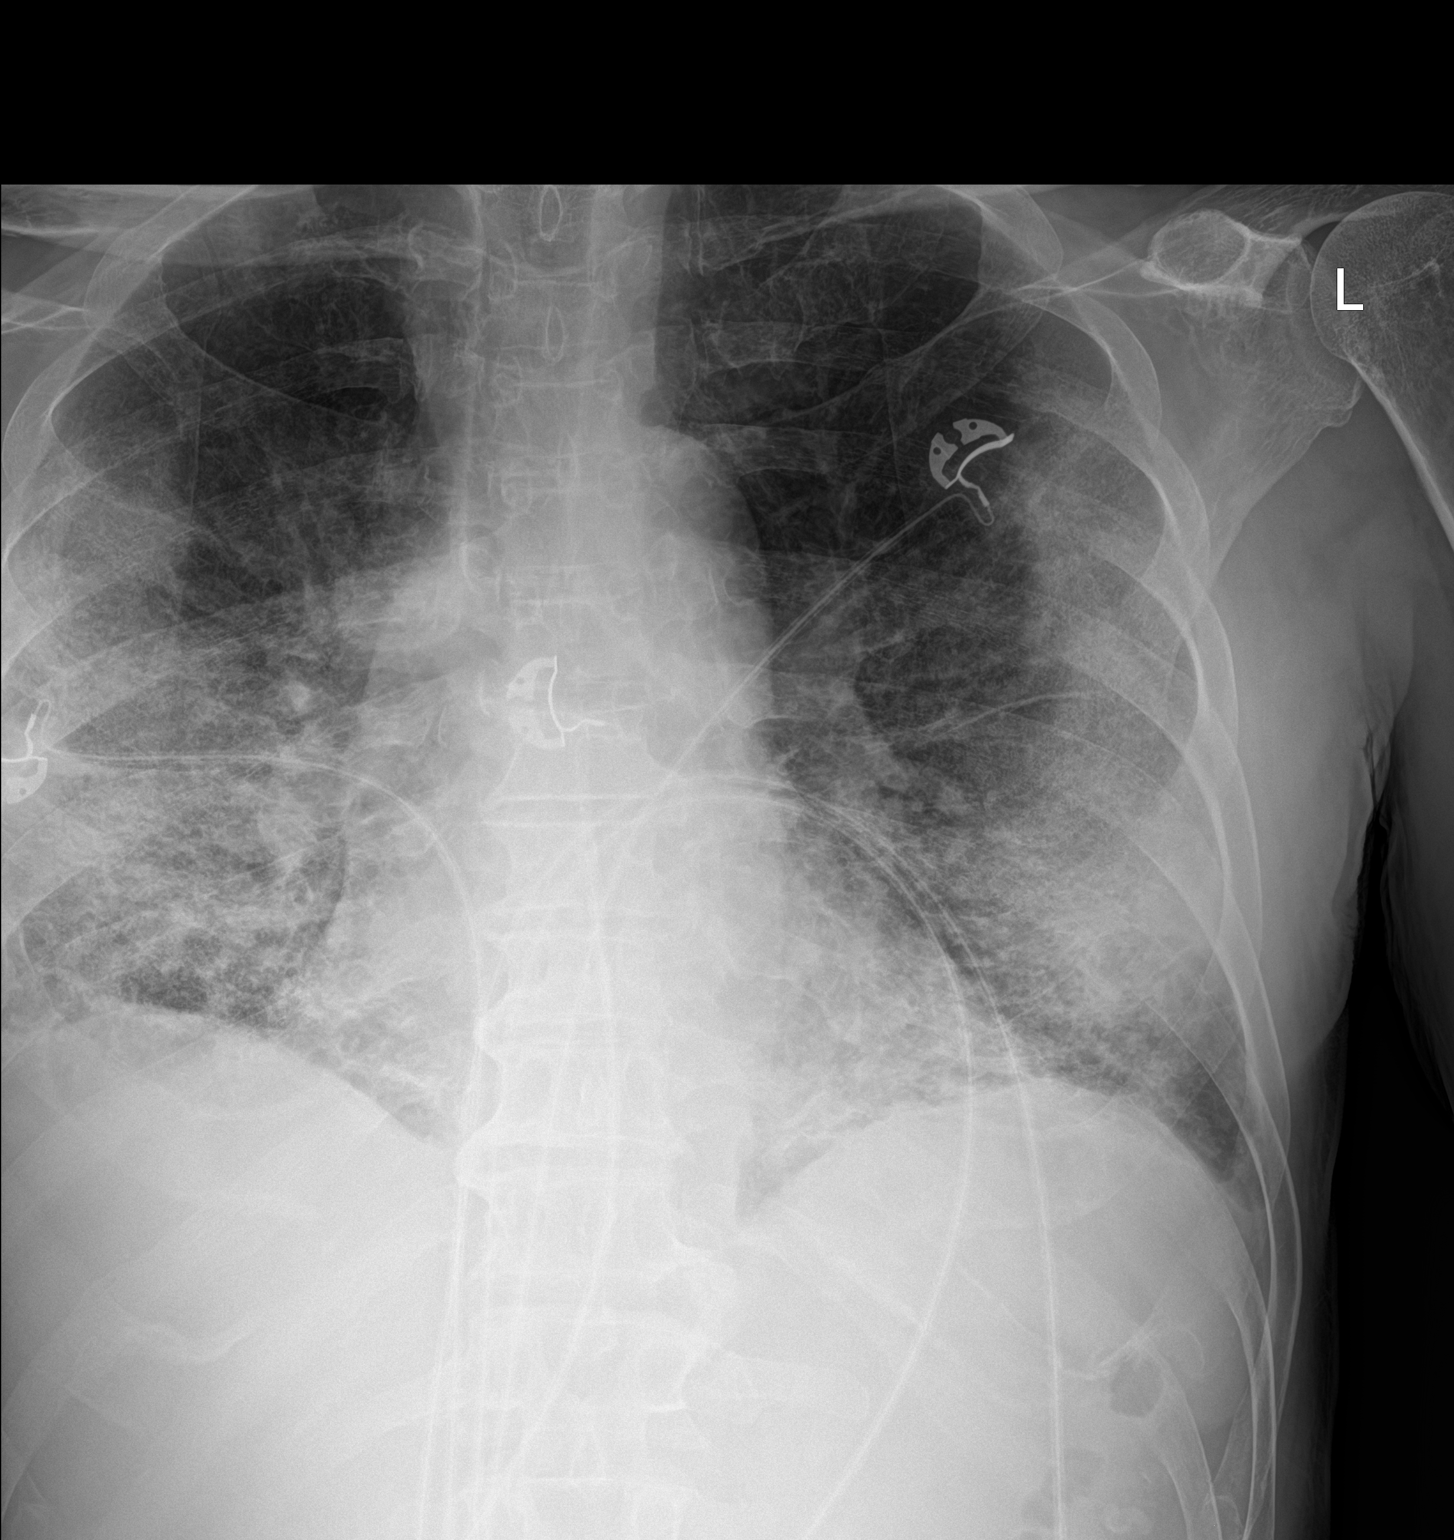

[2 of 2 positions shown; findings below may reference images not displayed]

FINDINGS: 8034 hours. The diffuse hazy ground-glass and consolidative airspace
opacity noted bilaterally with mid and lower lung predominance is
similar to prior study. No substantial pleural effusion.
Cardiopericardial silhouette is at upper limits of normal for size.
The visualized bony structures of the thorax are intact. Telemetry
leads overlie the chest.
IMPRESSION: No substantial change in the bilateral multifocal airspace disease.

## 2021-04-21 IMAGING — DX DG CHEST 1V PORT
1 series · 1 of 1 positions shown · non-contrast
Comparison: None.

CLINICAL DATA: Hypoxia, history of pneumonia

EXAM:
PORTABLE CHEST 1 VIEW

[chest ap]
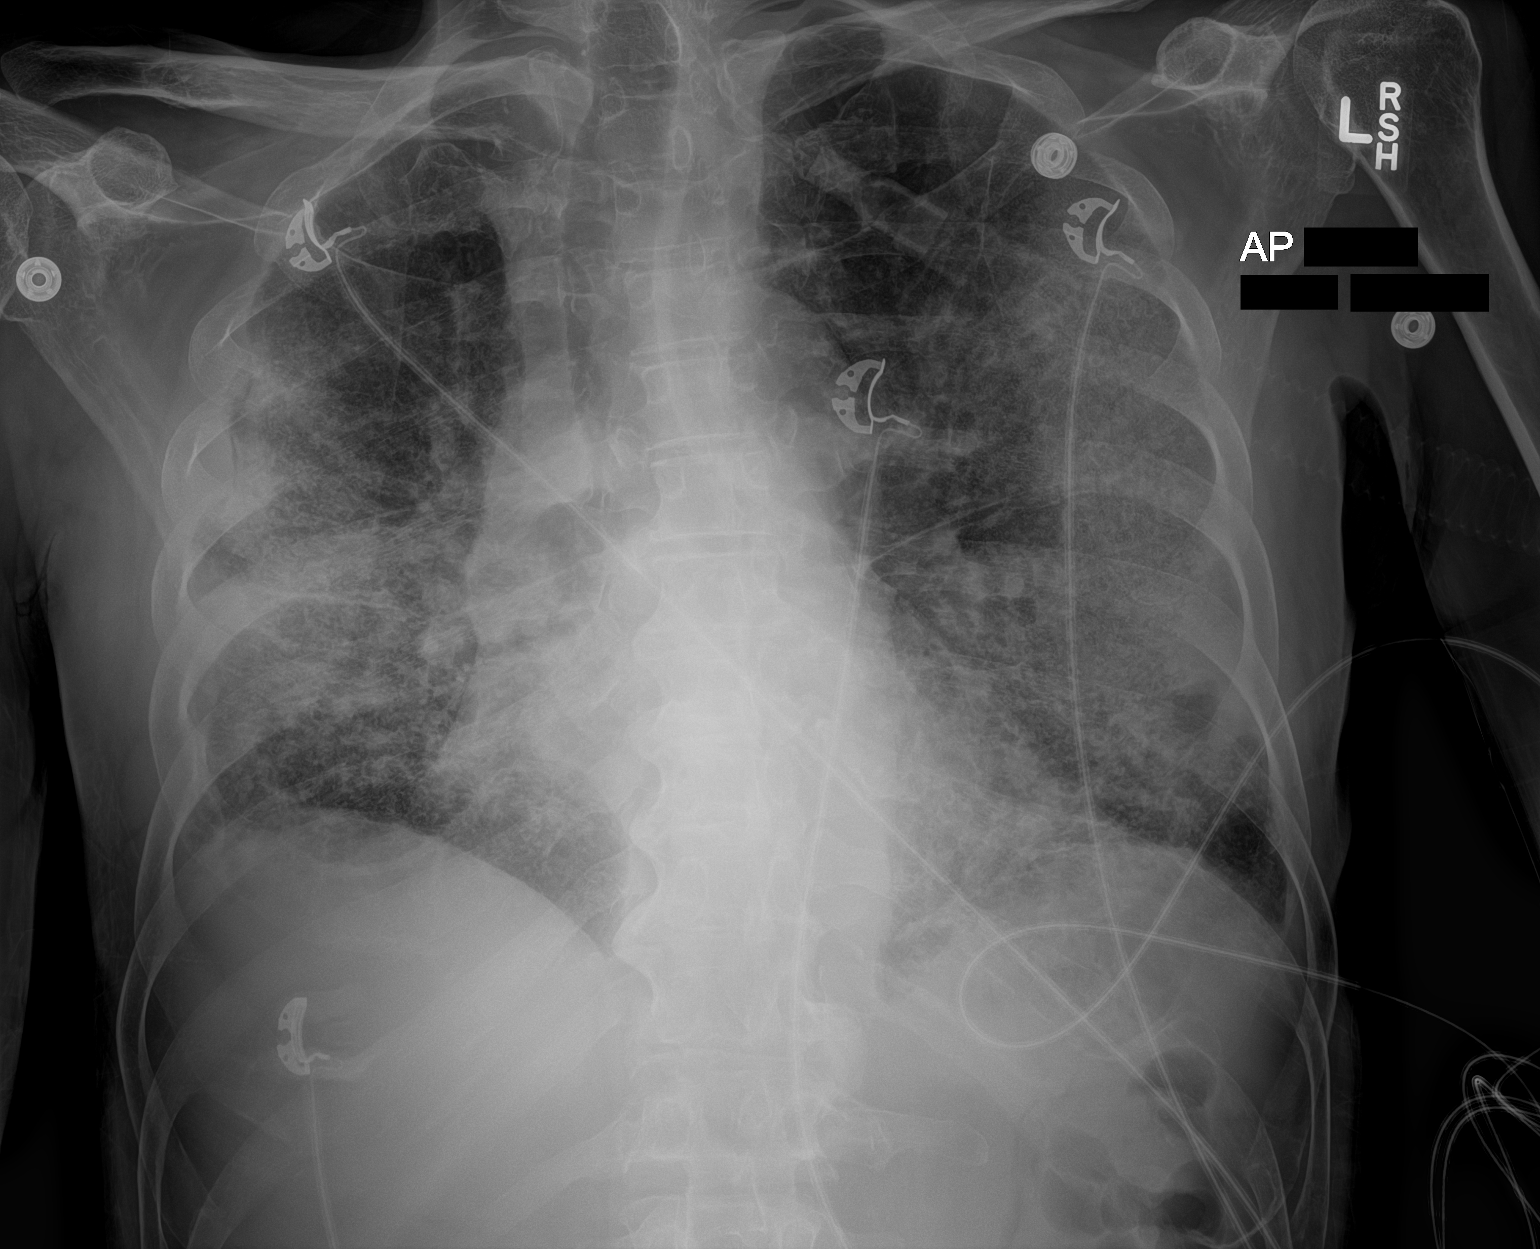

[1 of 1 positions shown; findings below may reference images not displayed]

FINDINGS: The heart size and mediastinal contours are unchanged with mild
cardiomegaly. There is no significant interval change in the
bilateral patchy and reticulonodular airspace opacities. No pleural
effusion. No acute osseous abnormality.
IMPRESSION: Stable examination. Multifocal patchy airspace opacities throughout
both lungs.

## 2021-04-22 IMAGING — DX DG CHEST 1V PORT
1 series · 1 of 1 positions shown · non-contrast
Comparison: Chest radiograph from one day prior.

CLINICAL DATA: Dyspnea, hypoxia

EXAM:
PORTABLE CHEST 1 VIEW

[chest ap]
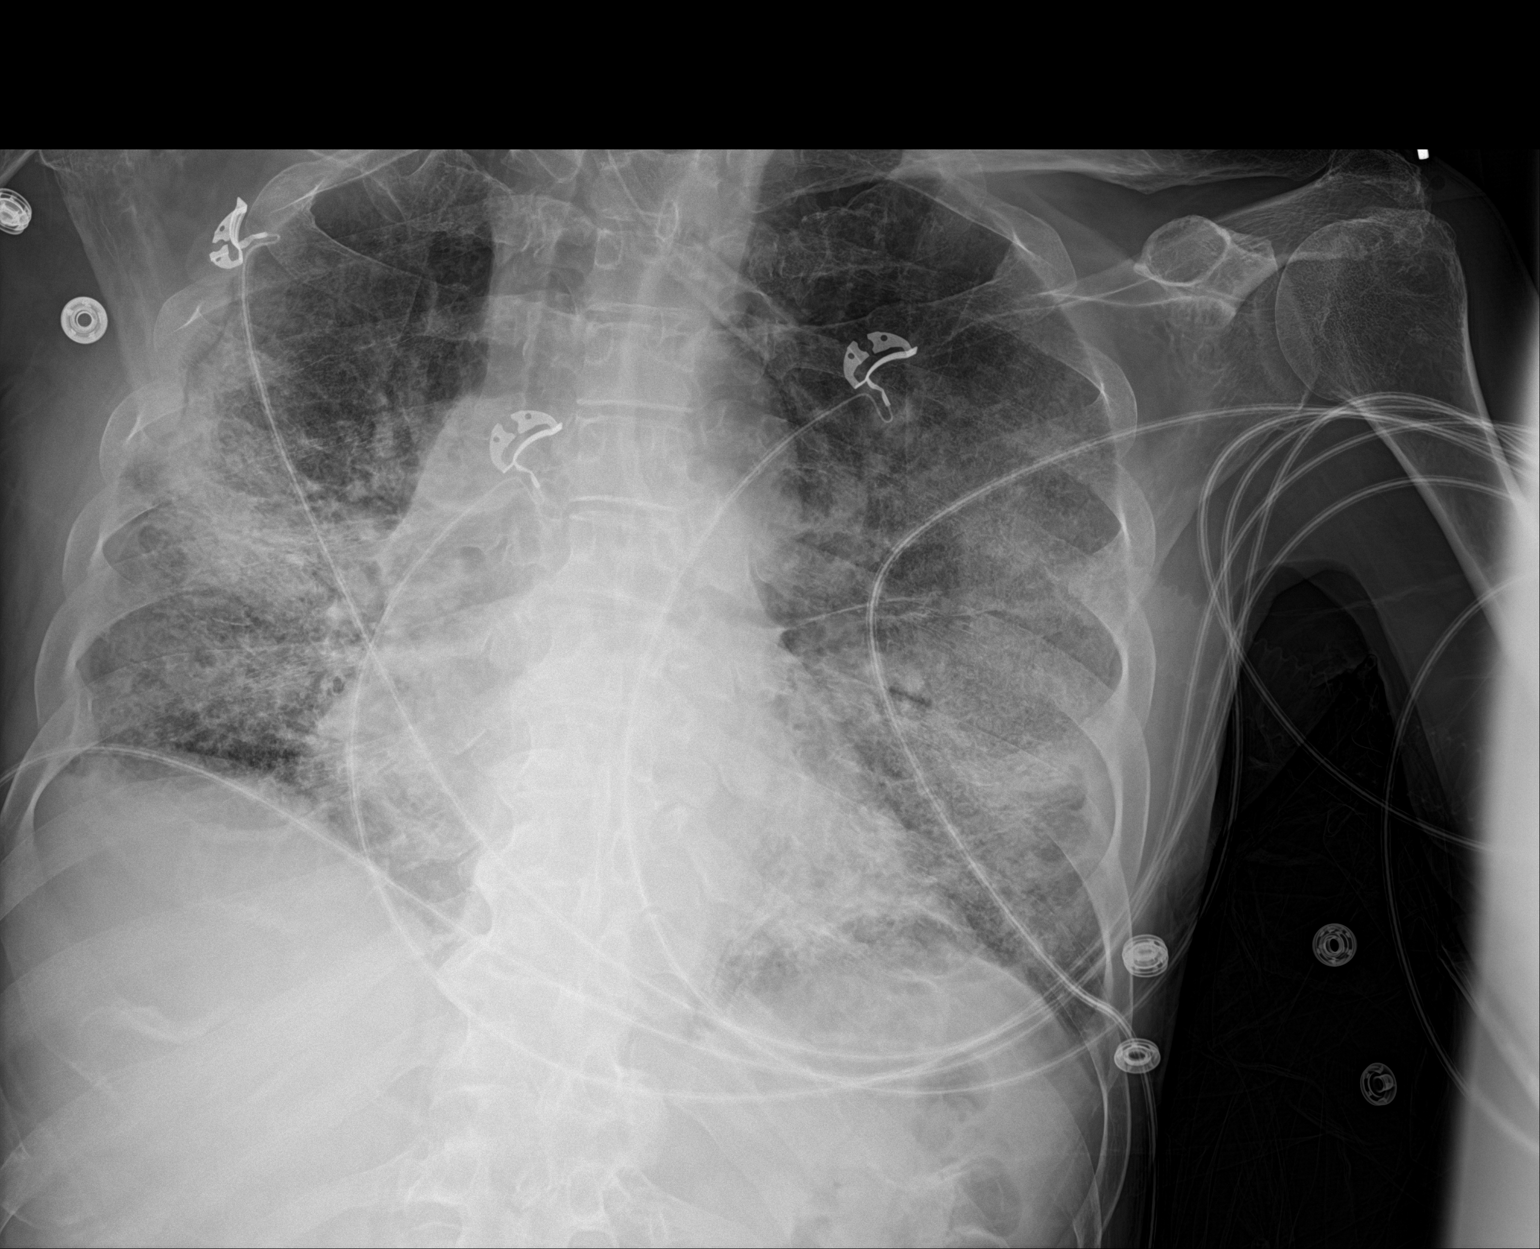

[1 of 1 positions shown; findings below may reference images not displayed]

FINDINGS: Stable cardiomediastinal silhouette with normal heart size. No
pneumothorax. No pleural effusion. Extensive patchy hazy opacity
throughout both lungs, most prominent in right mid lung, not
substantially changed.
IMPRESSION: Stable chest radiograph with extensive patchy hazy opacity
throughout both lungs, most prominent in the right mid lung.

## 2021-04-23 IMAGING — DX DG CHEST 1V PORT
1 series · 1 of 1 positions shown · non-contrast
Comparison: Portable exam 1379 hours compared to 08/08/2019

CLINICAL DATA: Shortness of breath, respiratory failure, smoker

EXAM:
PORTABLE CHEST 1 VIEW

[chest ap]
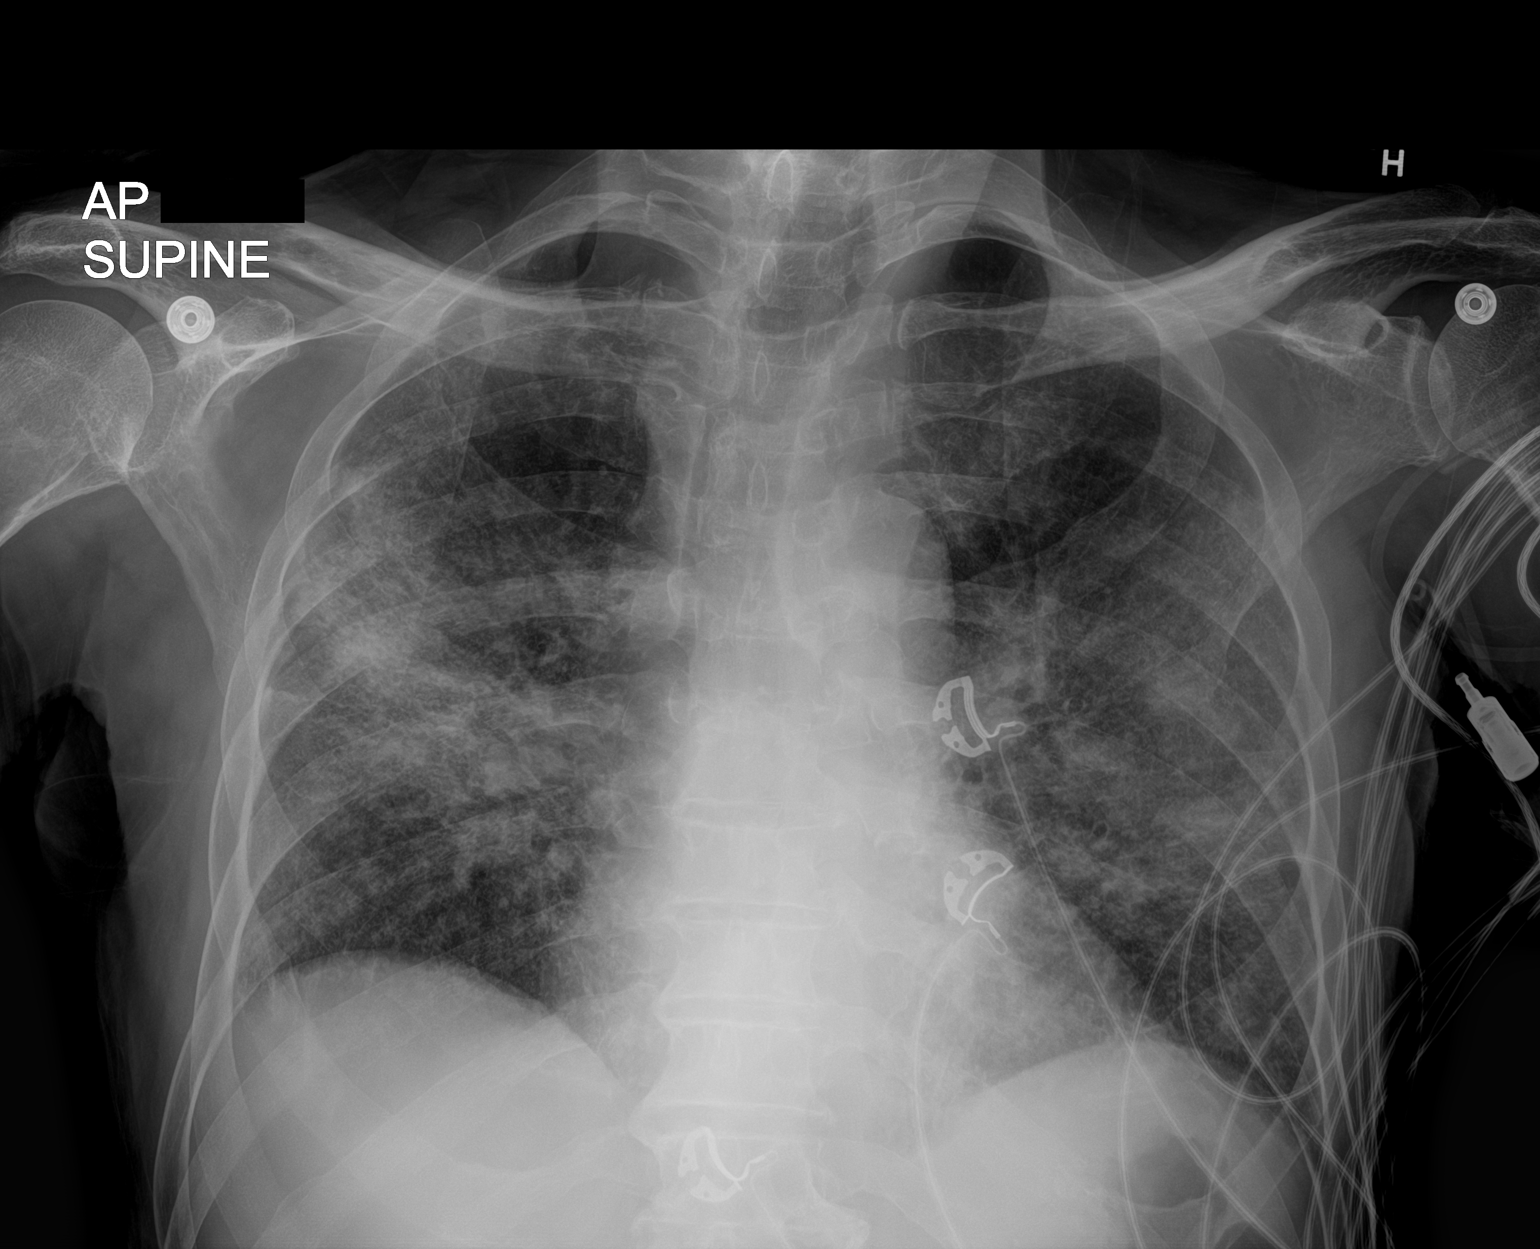

[1 of 1 positions shown; findings below may reference images not displayed]

FINDINGS: Normal heart size mediastinal contours.

Hazy infiltrates again identified throughout both lungs,
predominantly mid lungs, slightly greater on RIGHT than LEFT,
consistent with pneumonia.

No pleural effusion or pneumothorax.

Bones unremarkable.
IMPRESSION: Persistent pulmonary infiltrates consistent with multifocal
pneumonia.
